# Patient Record
Sex: Female | Born: 1950 | Race: White | Hispanic: No | Marital: Married | Smoking: Never smoker
Health system: Southern US, Community
[De-identification: ages and names within clinical notes are randomized; demographics above are authoritative.]

## PROBLEM LIST (undated history)

## (undated) DIAGNOSIS — I10 Essential (primary) hypertension: Secondary | ICD-10-CM

## (undated) HISTORY — PX: ABDOMINAL HYSTERECTOMY: SHX81

---

## 2019-05-06 ENCOUNTER — Other Ambulatory Visit: Payer: Self-pay | Admitting: Family Medicine

## 2019-05-06 DIAGNOSIS — Z1231 Encounter for screening mammogram for malignant neoplasm of breast: Secondary | ICD-10-CM

## 2019-05-20 ENCOUNTER — Other Ambulatory Visit: Payer: Self-pay

## 2019-05-20 ENCOUNTER — Ambulatory Visit
Admission: RE | Admit: 2019-05-20 | Discharge: 2019-05-20 | Disposition: A | Discharge status: Home / Self Care | Payer: Medicare Other | Source: Ambulatory Visit | Attending: Family Medicine | Admitting: Family Medicine

## 2019-05-20 DIAGNOSIS — Z1231 Encounter for screening mammogram for malignant neoplasm of breast: Secondary | ICD-10-CM | POA: Diagnosis not present

## 2019-05-23 ENCOUNTER — Other Ambulatory Visit: Payer: Self-pay | Admitting: Family Medicine

## 2019-05-23 DIAGNOSIS — N6310 Unspecified lump in the right breast, unspecified quadrant: Secondary | ICD-10-CM

## 2019-05-23 DIAGNOSIS — R928 Other abnormal and inconclusive findings on diagnostic imaging of breast: Secondary | ICD-10-CM

## 2019-05-30 ENCOUNTER — Other Ambulatory Visit: Payer: Self-pay

## 2019-05-30 ENCOUNTER — Ambulatory Visit
Admission: RE | Admit: 2019-05-30 | Discharge: 2019-05-30 | Disposition: A | Discharge status: Home / Self Care | Payer: Medicare Other | Source: Ambulatory Visit | Attending: Family Medicine | Admitting: Family Medicine

## 2019-05-30 DIAGNOSIS — N6310 Unspecified lump in the right breast, unspecified quadrant: Secondary | ICD-10-CM

## 2019-05-30 DIAGNOSIS — R928 Other abnormal and inconclusive findings on diagnostic imaging of breast: Secondary | ICD-10-CM | POA: Diagnosis not present

## 2019-08-28 ENCOUNTER — Emergency Department (HOSPITAL_COMMUNITY): Payer: Medicare Other

## 2019-08-28 ENCOUNTER — Emergency Department (HOSPITAL_COMMUNITY)
Admission: EM | Admit: 2019-08-28 | Discharge: 2019-08-28 | Disposition: A | Discharge status: Home / Self Care | Payer: Medicare Other | Attending: Emergency Medicine | Admitting: Emergency Medicine

## 2019-08-28 ENCOUNTER — Other Ambulatory Visit: Payer: Self-pay

## 2019-08-28 ENCOUNTER — Encounter (HOSPITAL_COMMUNITY): Payer: Self-pay | Admitting: Emergency Medicine

## 2019-08-28 DIAGNOSIS — Z23 Encounter for immunization: Secondary | ICD-10-CM | POA: Diagnosis not present

## 2019-08-28 DIAGNOSIS — W07XXXA Fall from chair, initial encounter: Secondary | ICD-10-CM | POA: Diagnosis not present

## 2019-08-28 DIAGNOSIS — S81812A Laceration without foreign body, left lower leg, initial encounter: Secondary | ICD-10-CM | POA: Insufficient documentation

## 2019-08-28 DIAGNOSIS — Y9389 Activity, other specified: Secondary | ICD-10-CM | POA: Insufficient documentation

## 2019-08-28 DIAGNOSIS — S52572A Other intraarticular fracture of lower end of left radius, initial encounter for closed fracture: Secondary | ICD-10-CM | POA: Diagnosis not present

## 2019-08-28 DIAGNOSIS — Y929 Unspecified place or not applicable: Secondary | ICD-10-CM | POA: Insufficient documentation

## 2019-08-28 DIAGNOSIS — Y999 Unspecified external cause status: Secondary | ICD-10-CM | POA: Diagnosis not present

## 2019-08-28 DIAGNOSIS — S52615A Nondisplaced fracture of left ulna styloid process, initial encounter for closed fracture: Secondary | ICD-10-CM | POA: Diagnosis not present

## 2019-08-28 DIAGNOSIS — W19XXXA Unspecified fall, initial encounter: Secondary | ICD-10-CM

## 2019-08-28 DIAGNOSIS — I10 Essential (primary) hypertension: Secondary | ICD-10-CM | POA: Diagnosis not present

## 2019-08-28 DIAGNOSIS — S6992XA Unspecified injury of left wrist, hand and finger(s), initial encounter: Secondary | ICD-10-CM | POA: Diagnosis present

## 2019-08-28 HISTORY — DX: Essential (primary) hypertension: I10

## 2019-08-28 MED ORDER — TETANUS-DIPHTH-ACELL PERTUSSIS 5-2.5-18.5 LF-MCG/0.5 IM SUSP
0.5000 mL | Freq: Once | INTRAMUSCULAR | Status: AC
  Administered 2019-08-28: 16:00:00 0.5 mL via INTRAMUSCULAR
  Filled 2019-08-28: qty 0.5

## 2019-08-28 NOTE — ED Triage Notes (Signed)
LT wrist pain and swelling since she fell out of a chair today. Also laceration to LT shin no active bleeding.

## 2019-08-28 NOTE — ED Provider Notes (Signed)
[facility] EMERGENCY DEPARTMENT Provider Note   CSN: [id number] Arrival date & time: 08/28/19  1212     History   Chief Complaint Chief Complaint  Patient presents with  . Wrist Pain    HPI Victoria Dominguez is a 68 y.o. female with a past medical history of hypertension who presents today for evaluation after a fall.  She reports that she was standing on a chair when the chair collapsed causing her to fall.  She denies striking her head or passing out.  She reports landing on her left wrist and left shin.  She reports that she has a abrasion on her left shin and that her left wrist hurts.  She denies any pain in her left elbow or shoulder.  She denies any headache or neck pain.  She denies any other injuries.  She does not take any blood thinning medications.  No nausea or vomiting.  She denies any weakness, numbness, or tingling.  She has not had any medications or tried any interventions prior to arrival.  Last p.o. intake was at approximately 11 AM when she had a sandwich.     HPI  Past Medical History:  Diagnosis Date  . Hypertension     There are no active problems to display for this patient.   Past Surgical History:  Procedure Laterality Date  . ABDOMINAL HYSTERECTOMY       OB History   No obstetric history on file.      Home Medications    Prior to Admission medications   Not on File    Family History Family History  Problem Relation Age of Onset  . Breast cancer Sister 62    Social History Social History   Tobacco Use  . Smoking status: Never Smoker  . Smokeless tobacco: Never Used  Substance Use Topics  . Alcohol use: Never    Frequency: Never  . Drug use: Never     Allergies   Patient has no known allergies.   Review of Systems Review of Systems  Constitutional: Negative for chills and fever.  HENT: Negative for congestion.   Musculoskeletal: Negative for back pain and neck pain.       Deformity and pain of the left wrist.  She is  right-hand dominant.  Neurological: Negative for weakness and headaches.  All other systems reviewed and are negative.    Physical Exam Updated Vital Signs BP (!) 160/68 (BP Location: Right Arm)   Pulse (!) 114   Temp 99.1 F (37.3 C) (Oral)   Resp 18   Ht 5\' 6"  (1.676 m)   Wt 61.2 kg   SpO2 100%   BMI 21.79 kg/m   Physical Exam Vitals signs and nursing note reviewed.  Constitutional:      General: She is not in acute distress.    Appearance: She is well-developed. She is not diaphoretic.  HENT:     Head: Normocephalic and atraumatic.  Eyes:     General: No scleral icterus.       Right eye: No discharge.        Left eye: No discharge.     Conjunctiva/sclera: Conjunctivae normal.  Neck:     Musculoskeletal: Normal range of motion.  Cardiovascular:     Rate and Rhythm: Normal rate and regular rhythm.     Comments: 2+ left radial pulse.  Brisk capillary refill to fingers on left hand. Pulmonary:     Effort: Pulmonary effort is normal. No respiratory distress.  Breath sounds: No stridor.  Abdominal:     General: There is no distension.  Musculoskeletal:        General: No deformity.     Comments: There is obvious deformity of the left wrist with edema and tenderness to palpation.  There is no tenderness palpation over the left elbow, humerus, or shoulder.  Skin:    General: Skin is warm and dry.     Comments: There is a 3 cm skin tear on the left anterior shin.  No active bleeding at this time. Skin is intact over the left wrist/hand.  Neurological:     Mental Status: She is alert.     Motor: No abnormal muscle tone.     Comments: Sensation over left hand fingers is intact to light touch.  She is able to move the fingers on her left hand without pain or difficulty.  Psychiatric:        Mood and Affect: Mood normal.        Behavior: Behavior normal.      ED Treatments / Results  Labs (all labs ordered are listed, but only abnormal results are displayed)  Labs Reviewed - No data to display  EKG None  Radiology Dg Wrist Complete Left  Result Date: 08/28/2019 CLINICAL DATA:  Fall.  Pain and swelling. EXAM: LEFT WRIST - COMPLETE 3+ VIEW COMPARISON:  Insert [place] in FINDINGS: Comminuted fracture of the distal radial metaphysis identified with extension to the articular surface. There is apex anterior angulation. Associated ulnar styloid fracture noted on oblique film. Bones are diffusely demineralized IMPRESSION: Comminuted fracture of the distal radius with intra-articular extension and apex anterior angulation. Associated ulnar styloid fracture. Electronically Signed   By: Misty Stanley M.D.   On: 08/28/2019 13:44    Procedures Procedures (including critical care time)  Medications Ordered in ED Medications  Tdap (BOOSTRIX) injection 0.5 mL (0.5 mLs Intramuscular Incomplete 08/28/19 1602)     Initial Impression / Assessment and Plan / ED Course  I have reviewed the triage vital signs and the nursing notes.  Pertinent labs & imaging results that were available during my care of the patient were reviewed by me and considered in my medical decision making (see chart for details).       Patient presents today for evaluation after a non-syncopal mechanical fall.  She was standing on a chair when it collapsed causing her to fall on her left arm and shin.  She denies any pain in her left shin other than on the skin.  X-rays were obtained of her left wrist showing a comminuted fracture of the distal radial metaphysis with extension to the articular surface and apex anterior angulation.  She also has a ulnar styloid fracture.  Hand surgery was consulted. They recommended outpatient follow-up and for her to call the office today or tomorrow to set up an appointment.  Sugar tong splint and sling were ordered. She was offered Vicodin for pain control, however declined stating that she wishes to take Aleve at home.  For her skin tear on her shin we  discussed wound care options.  Based on how frail the skin is I suspect any sutures would tear.  It is not significantly gaping and is very shallow.  We discussed Steri-Strips versus antibiotic ointment and keeping it covered and she elected for antibiotic ointment and keeping it covered, declined Steri-Strips.  Tdap updated.  Return precautions were discussed with patient who states their understanding.  At the time of  discharge patient denied any unaddressed complaints or concerns.  Patient is agreeable for discharge home.   Final Clinical Impressions(s) / ED Diagnoses   Final diagnoses:  Other closed intra-articular fracture of distal end of left radius, initial encounter  Closed nondisplaced fracture of styloid process of left ulna, initial encounter  Fall, initial encounter    ED Discharge Orders    None       Lorin Glass, PA-C 08/28/19 1638    Sherwood Gambler, MD 08/30/19 1546

## 2019-08-28 NOTE — Discharge Instructions (Addendum)

## 2019-09-02 ENCOUNTER — Encounter: Payer: Self-pay | Admitting: Orthopedic Surgery

## 2019-09-02 ENCOUNTER — Encounter (HOSPITAL_COMMUNITY): Payer: Self-pay

## 2019-09-02 ENCOUNTER — Other Ambulatory Visit (HOSPITAL_COMMUNITY)
Admission: RE | Admit: 2019-09-02 | Discharge: 2019-09-02 | Disposition: A | Discharge status: Home / Self Care | Payer: Medicare Other | Source: Ambulatory Visit | Attending: Orthopedic Surgery | Admitting: Orthopedic Surgery

## 2019-09-02 ENCOUNTER — Ambulatory Visit (INDEPENDENT_AMBULATORY_CARE_PROVIDER_SITE_OTHER): Payer: Medicare Other | Admitting: Orthopedic Surgery

## 2019-09-02 ENCOUNTER — Other Ambulatory Visit: Payer: Self-pay

## 2019-09-02 ENCOUNTER — Encounter (HOSPITAL_COMMUNITY)
Admission: RE | Admit: 2019-09-02 | Discharge: 2019-09-02 | Disposition: A | Discharge status: Home / Self Care | Payer: Medicare Other | Source: Ambulatory Visit | Attending: Orthopedic Surgery | Admitting: Orthopedic Surgery

## 2019-09-02 VITALS — BP 132/68 | HR 96 | Ht 65.5 in | Wt 135.0 lb

## 2019-09-02 DIAGNOSIS — Z20828 Contact with and (suspected) exposure to other viral communicable diseases: Secondary | ICD-10-CM | POA: Insufficient documentation

## 2019-09-02 DIAGNOSIS — S52532A Colles' fracture of left radius, initial encounter for closed fracture: Secondary | ICD-10-CM | POA: Diagnosis not present

## 2019-09-02 DIAGNOSIS — I10 Essential (primary) hypertension: Secondary | ICD-10-CM | POA: Diagnosis not present

## 2019-09-02 DIAGNOSIS — S62102A Fracture of unspecified carpal bone, left wrist, initial encounter for closed fracture: Secondary | ICD-10-CM | POA: Diagnosis not present

## 2019-09-02 DIAGNOSIS — R000 Tachycardia, unspecified: Secondary | ICD-10-CM | POA: Insufficient documentation

## 2019-09-02 DIAGNOSIS — Z01812 Encounter for preprocedural laboratory examination: Secondary | ICD-10-CM | POA: Insufficient documentation

## 2019-09-02 LAB — CBC WITH DIFFERENTIAL/PLATELET
Abs Immature Granulocytes: 0.01 10*3/uL (ref 0.00–0.07)
Basophils Absolute: 0 10*3/uL (ref 0.0–0.1)
Basophils Relative: 1 %
Eosinophils Absolute: 0.1 10*3/uL (ref 0.0–0.5)
Eosinophils Relative: 1 %
HCT: 38.2 % (ref 36.0–46.0)
Hemoglobin: 12 g/dL (ref 12.0–15.0)
Immature Granulocytes: 0 %
Lymphocytes Relative: 17 %
Lymphs Abs: 0.9 10*3/uL (ref 0.7–4.0)
MCH: 28.7 pg (ref 26.0–34.0)
MCHC: 31.4 g/dL (ref 30.0–36.0)
MCV: 91.4 fL (ref 80.0–100.0)
Monocytes Absolute: 0.3 10*3/uL (ref 0.1–1.0)
Monocytes Relative: 6 %
Neutro Abs: 3.9 10*3/uL (ref 1.7–7.7)
Neutrophils Relative %: 75 %
Platelets: 257 10*3/uL (ref 150–400)
RBC: 4.18 MIL/uL (ref 3.87–5.11)
RDW: 12.5 % (ref 11.5–15.5)
WBC: 5.2 10*3/uL (ref 4.0–10.5)
nRBC: 0 % (ref 0.0–0.2)

## 2019-09-02 LAB — BASIC METABOLIC PANEL
Anion gap: 10 (ref 5–15)
BUN: 15 mg/dL (ref 8–23)
CO2: 24 mmol/L (ref 22–32)
Calcium: 9.2 mg/dL (ref 8.9–10.3)
Chloride: 107 mmol/L (ref 98–111)
Creatinine, Ser: 0.76 mg/dL (ref 0.44–1.00)
GFR calc Af Amer: >60 mL/min (ref >60)
GFR calc non Af Amer: >60 mL/min (ref >60)
Glucose, Bld: 111 mg/dL — ABNORMAL HIGH (ref 70–99)
Potassium: 3.9 mmol/L (ref 3.5–5.1)
Sodium: 141 mmol/L (ref 135–145)

## 2019-09-02 LAB — SARS CORONAVIRUS 2 (TAT 6-24 HRS): SARS Coronavirus 2: NEGATIVE

## 2019-09-02 NOTE — Patient Instructions (Signed)
Colles Fracture  Colles fracture is a type of broken wrist. It means that the radius bone is broken or cracked near the wrist joint. The radius is one of two bones in the forearm. It is on the same side as the thumb. The other forearm bone is called the ulna. Often, when someone has a Colles fracture, the ulna is also broken. As this injury heals, a splint or a cast is used to prevent the injured bone from moving (keep it immobilized). What are the causes? Common causes of this type of fracture include:  A hard, direct hit to the wrist.  Accidents, such as a car accident or falling on an outstretched hand. What increases the risk? You may be at higher risk for this type of fracture if you:  Play contact sports or high-risk sports, such as skiing, biking, or ice-skating.  Smoke.  Drink more than three alcoholic beverages a day.  Have low or lowered bone density (osteoporosis or osteopenia).  Are a young child or an older adult.  Are a woman who has gone through menopause.  Have a history of bone fractures.  Are not getting enough (have a deficiency in) calcium or vitamin D. What are the signs or symptoms? Symptoms of a Colles fracture may include:  Tenderness, bruising, and swelling over the fracture, which is usually near the wrist.  The wrist hanging in an odd position or looking misshapen (deformed).  Difficulty moving the wrist. How is this diagnosed? This condition may be diagnosed based on:  A physical exam.  A forearm X-ray.  Your symptoms and medical history. How is this treated? Treatment depends on many factors, including your age, your activity level, and how severe your fracture is. Treatment may include:  Immobilizing the wrist with a splint or a cast for several weeks. Before a splint or cast is placed on the arm, the health care provider may move the fractured bone or bones back into place (realignment).  Surgery, if the bone is completely out of place  (displaced). Metal pins or other devices may be used to help hold the bone in place while it heals. After surgery, the arm is put in a splint or cast.  Physical therapy. Follow these instructions at home: If you have a splint:  Wear the splint as told by your health care provider. Remove it only as told by your health care provider.  Loosen the splint if your fingers tingle, become numb, or turn cold and blue.  Keep the splint clean.  If your splint is not waterproof: ? Do not let it get wet. ? Cover it with a watertight covering when you take a bath or a shower. If you have a cast:  Do not stick anything inside the cast to scratch your skin. Doing that increases your risk for infection.  Check the skin around the cast every day. Tell your health care provider about any concerns.  You may put lotion on dry skin around the edges of the cast. Do not put lotion on the skin underneath the cast.  Keep the cast clean.  If the cast is not waterproof: ? Do not let it get wet. ? Cover it with a watertight covering when you take a bath or a shower. Managing pain, stiffness, and swelling   If directed, put ice on the injured area: ? If you have a removable splint, remove it as told by your health care provider. ? Put ice in a plastic bag. ?  Place a towel between your skin and the bag, or between your cast and the bag. ? Leave the ice on for 20 minutes, 2-3 times a day.  Move your fingers often to avoid stiffness and to lessen swelling.  Raise (elevate) your wrist above the level of your heart while you are sitting or lying down. Driving  Do not drive or use heavy machinery while taking prescription pain medicine.  Ask your health care provider if it is safe for you to drive if you have a splint or cast on your arm. Activity  Do not lift anything that is heavier than 10 lb (4.5 kg), or the limit that you are told, until your health care provider says that it is safe.  Do not use  your arm to support your body weight until your health care provider says that you can.  Return to your normal activities as told by your health care provider. Ask your health care provider what activities are safe for you.  If physical therapy was prescribed, do exercises as told by your health care provider. General instructions  Do not put pressure on any part of the splint or cast until it is fully hardened. This may take several hours.  Do not use any products that contain nicotine or tobacco, such as cigarettes and e-cigarettes. These can delay bone healing. If you need help quitting, ask your health care provider.  Take over-the-counter and prescription medicines only as told by your health care provider.  Keep all follow-up visits as told by your health care provider. This is important. Contact a health care provider if:  Your splint or cast: ? Gets wet. ? Gets damaged. ? Suddenly feels too tight.  You have: ? A fever or chills. ? Pain that does not get better with medicine. ? Swelling that gets worse. Get help right away if:  Your hand or fingernails: ? Turn blue or gray. ? Feel cold or numb.  You have tingling or numbness in your fingers, even after you loosen your splint (if this applies). Summary  Colles fracture is a type of broken wrist. It often involves both of the bones in the forearm (radius and ulna).  Fractures are common in young, active people who are involved in high-energy activities. They are also common in older people who are at risk for osteoporosis.  This injury is diagnosed with a physical exam and X-rays.  Your wrist will need to be held in place (immobilized) with a splint or cast for several weeks. You may need surgery for a more severe fracture. This information is not intended to replace advice given to you by your health care provider. Make sure you discuss any questions you have with your health care provider. Document Released:  11/23/2006 Document Revised: 10/20/2017 Document Reviewed: 10/06/2017 Elsevier Patient Education  2020 [employer].

## 2019-09-02 NOTE — Patient Instructions (Signed)
Your procedure is scheduled on: 09/05/2019  Report to [facility] at  9:10   AM.  Call this number if you have problems the morning of surgery: [phone]   Remember:   Do not Eat or Drink after midnight   :  Take these medicines the morning of surgery with A SIP OF WATER: omeprazole and flonase   Do not wear jewelry, make-up or nail polish.  Do not wear lotions, powders, or perfumes. You may wear deodorant.  Do not shave 48 hours prior to surgery. Men may shave face and neck.  Do not bring valuables to the hospital.  Contacts, dentures or bridgework may not be worn into surgery.  Leave suitcase in the car. After surgery it may be brought to your room.  For patients admitted to the hospital, checkout time is 11:00 AM the day of discharge.   Patients discharged the day of surgery will not be allowed to drive home.    Special Instructions: Shower using CHG night before surgery and shower the day of surgery use CHG.  Use special wash - you have one bottle of CHG for all showers.  You should use approximately 1/2 of the bottle for each shower.  Wrist Fracture Treated With ORIF A wrist fracture is a break or crack in one of the bones of the wrist. The wrist is made up of eight small bones at the palm of the hand (carpal bones) and two long bones that make up the forearm (radius and ulna). If the fracture is displaced, it means that one or more parts of a bone have been moved out of their normal position and the bones are not lined up correctly. Open reduction with internal fixation (ORIF) is a surgical procedure that is used to treat a displaced wrist fracture. In this procedure, a surgeon will put the bone pieces back together. The surgeon will put in plates, screws, or other types of hardware to hold the bones in place. The procedure helps the bones heal properly. Tell a health care provider about:  Any allergies you have.  All medicines you are taking, including vitamins, herbs, eye  drops, creams, and over-the-counter medicines.  Any problems you or family members have had with anesthetic medicines.  Any blood disorders you have.  Any surgeries you have had.  Any medical conditions you have.  Whether you are pregnant or may be pregnant. What are the risks? Generally, this is a safe procedure. However, problems may occur, including:  Infection.  Bleeding.  Failure of the bone to heal.  Wrist stiffness.  Damage to other structures.  Allergic reactions to medicines. What happens before the procedure? Staying hydrated Follow instructions from your health care provider about hydration, which may include:  Up to 2 hours before the procedure - you may continue to drink clear liquids, such as water, clear fruit juice, black coffee, and plain tea.  Eating and drinking restrictions Follow instructions from your health care provider about eating and drinking, which may include:  8 hours before the procedure - stop eating heavy meals or foods, such as meat, fried foods, or fatty foods.  6 hours before the procedure - stop eating light meals or foods, such as toast or cereal.  6 hours before the procedure - stop drinking milk or drinks that contain milk.  2 hours before the procedure - stop drinking clear liquids. Medicines Ask your health care provider about:  Changing or stopping your regular medicines. This is especially important  if you are taking diabetes medicines or blood thinners.  Taking medicines such as aspirin and ibuprofen. These medicines can thin your blood. Do not take these medicines unless your health care provider tells you to take them.  Taking over-the-counter medicines, vitamins, herbs, and supplements. General instructions  Plan to have someone take you home from the hospital or clinic.  Plan to have a responsible adult care for you for at least 24 hours after you leave the hospital or clinic. This is important.  Ask your health  care provider what steps will be taken to help prevent infection. These may include: ? Removing hair at the surgery site. ? Washing skin with a germ-killing soap. ? Taking antibiotic medicine. What happens during the procedure?   An IV will be inserted into one of your veins.  You will be given one or more of the following: ? A medicine to help you relax (sedative). ? A medicine to numb the area (local anesthetic). ? A medicine to make you fall asleep (general anesthetic). ? A medicine that is injected into an area of your body to numb everything below the injection site (regional anesthetic).  The surgeon will make an incision through your skin over the area of the fracture.  The broken bones will be returned to their normal positions. To hold the bones in place, the surgeon will use screws and a metal plate or different types of wiring.  The surgeon will close the incision with stitches (sutures) or staples.  A bandage (dressing) will be placed over your incision.  A splint or cast may be placed over your dressing. The procedure may vary among health care providers and hospitals. What happens after the procedure?  Your blood pressure, heart rate, breathing rate, and blood oxygen level will be monitored until you leave the hospital or clinic.  You will be given medicine as needed for pain.  Your wrist will be placed on pillows to keep it raised (elevated). This will help prevent swelling.  You may have physical therapy while you are in the hospital.  You may be sent home with a sling to support your arm.  Do not drive until your health care provider approves. Summary  If a wrist fracture is displaced, it means that one or more parts of a bone have been moved out of their normal position and the bones are not lined up correctly.  A displaced wrist fracture will be treated with a surgical procedure that is called open reduction with internal fixation (ORIF).  The bone  pieces are put back together and held in place by plates, screws, or other types of hardware.  Follow instructions from your health care provider about eating and drinking before the procedure. This information is not intended to replace advice given to you by your health care provider. Make sure you discuss any questions you have with your health care provider. Document Released: 10/19/2015 Document Revised: 03/27/2018 Document Reviewed: 03/27/2018 Elsevier Patient Education  [place].  Wrist Fracture Treated With ORIF, Care After This sheet gives you information about how to care for yourself after your procedure. Your health care provider may also give you more specific instructions. If you have problems or questions, contact your health care provider. What can I expect after the procedure? After the procedure, it is common to have:  Pain.  Swelling.  Stiffness.  A small amount of drainage from the incision. Follow these instructions at home: If you have a cast:  Do not stick anything inside the cast to scratch your skin. Doing that increases your risk of infection.  Check the skin around the cast every day. Tell your health care provider about any concerns.  You may put lotion on dry skin around the edges of the cast. Do not put lotion on the skin underneath the cast.  Keep the cast clean and dry. If you have a splint or sling:  Wear the splint or sling as told by your health care provider. Remove it only as told by your health care provider.  Loosen the splint or sling if your fingers tingle, become numb, or turn cold and blue.  Keep the splint or sling clean and dry. Bathing  Do not take baths, swim, or use a hot tub until your health care provider approves. Ask your health care provider if you may take showers. You may only be allowed to take sponge baths.  If your cast, splint, or sling is not waterproof: ? Do not let it get wet. ? Cover it with a  watertight covering when you take a bath or shower.  If you have a sling, remove it for bathing only if your health care provider tells you that it is safe to do so.  Keep the bandage (dressing) dry until your health care provider says it can be removed. Incision care   Follow instructions from your health care provider about how to take care of your incision. Make sure you: ? Wash your hands with soap and water before you change your bandage (dressing). If soap and water are not available, use hand sanitizer. ? Change your dressing as told by your health care provider. ? Leave stitches (sutures), skin glue, or adhesive strips in place. These skin closures may need to stay in place for 2 weeks or longer. If adhesive strip edges start to loosen and curl up, you may trim the loose edges. Do not remove adhesive strips completely unless your health care provider tells you to do that.  Check your incision area every day for signs of infection. Check for: ? Redness. ? More swelling or pain. ? Blood or more fluid. ? Warmth. ? Pus or a bad smell. Managing pain, stiffness, and swelling   If directed, put ice on the injured area. ? If you have a removable splint or sling, remove it as told by your health care provider. ? Put ice in a plastic bag. ? Place a towel between your skin and the bag or between your cast and the bag. ? Leave the ice on for 20 minutes, 2-3 times a day.  Move your fingers often to avoid stiffness and to lessen swelling.  Raise (elevate) the injured area above the level of your heart while you are sitting or lying down. Driving  Do not drive or use heavy machinery while taking prescription pain medicine.  Do not drive for 24 hours if you were given a sedative during your procedure.  Ask your health care provider when it is safe to drive if you have a cast, splint, or sling on your wrist. Activity  Return to your normal activities as told by your health care  provider. Ask your health care provider what activities are safe for you.  Do exercises as told by your health care provider.  Do not lift with your injured wrist until your health care provider approves.  Avoid pulling and pushing. General instructions  Do not put pressure on any part of the cast  or splint until it is fully hardened. This may take several hours.  Take over-the-counter and prescription medicines only as told by your health care provider.  Do not use any products that contain nicotine or tobacco, such as cigarettes and e-cigarettes. These can delay bone healing after surgery. If you need help quitting, ask your health care provider.  If you were prescribed pain medicine, take steps to prevent or treat constipation. Your health care provider may recommend that you: ? Drink enough fluid to keep your urine pale yellow. ? Take over-the-counter or prescription medicines. ? Eat foods that are high in fiber, such as beans, whole grains, and fresh fruits and vegetables. ? Limit foods that are high in fat and processed sugars, such as fried or sweet foods.  Keep all follow-up visits as told by your health care provider. This is important. Contact a health care provider if:  Your cast, splint, or sling is damaged or loose.  Your pain is not controlled with medicine.  You have a fever.  You have redness around your incision.  You have more swelling or pain around your incision.  You have blood or more fluid coming from your incision.  Your incision feels warm to the touch.  You have pus or a bad smell coming from your incision or your dressing.  You develop a rash. Get help right away if:  Your skin or fingers on your injured arm turn blue or gray.  Your arm feels cold or numb.  You have severe pain in your injured wrist.  You have trouble breathing.  You feel faint or light-headed. Summary  After the procedure, it is common to have pain, swelling,  stiffness, and a small amount of drainage from the incision.  You may use ice, elevation, and pain medicine as told by your health care provider to lessen pain and swelling.  Wear your splint or sling as told by your health care provider.  Do not lift with your injured wrist until your health care provider approves. This information is not intended to replace advice given to you by your health care provider. Make sure you discuss any questions you have with your health care provider. Document Released: 10/19/2015 Document Revised: 03/27/2018 Document Reviewed: 03/27/2018 Elsevier Patient Education  2020 [place] Anesthesia, Adult, Care After This sheet gives you information about how to care for yourself after your procedure. Your health care provider may also give you more specific instructions. If you have problems or questions, contact your health care provider. What can I expect after the procedure? After the procedure, the following side effects are common:  Pain or discomfort at the IV site.  Nausea.  Vomiting.  Sore throat.  Trouble concentrating.  Feeling cold or chills.  Weak or tired.  Sleepiness and fatigue.  Soreness and body aches. These side effects can affect parts of the body that were not involved in surgery. Follow these instructions at home:  For at least 24 hours after the procedure:  Have a responsible adult stay with you. It is important to have someone help care for you until you are awake and alert.  Rest as needed.  Do not: ? Participate in activities in which you could fall or become injured. ? Drive. ? Use heavy machinery. ? Drink alcohol. ? Take sleeping pills or medicines that cause drowsiness. ? Make important decisions or sign legal documents. ? Take care of children on your own. Eating and drinking  Follow any instructions  from your health care provider about eating or drinking restrictions.  When you feel hungry,  start by eating small amounts of foods that are soft and easy to digest (bland), such as toast. Gradually return to your regular diet.  Drink enough fluid to keep your urine pale yellow.  If you vomit, rehydrate by drinking water, juice, or clear broth. General instructions  If you have sleep apnea, surgery and certain medicines can increase your risk for breathing problems. Follow instructions from your health care provider about wearing your sleep device: ? Anytime you are sleeping, including during daytime naps. ? While taking prescription pain medicines, sleeping medicines, or medicines that make you drowsy.  Return to your normal activities as told by your health care provider. Ask your health care provider what activities are safe for you.  Take over-the-counter and prescription medicines only as told by your health care provider.  If you smoke, do not smoke without supervision.  Keep all follow-up visits as told by your health care provider. This is important. Contact a health care provider if:  You have nausea or vomiting that does not get better with medicine.  You cannot eat or drink without vomiting.  You have pain that does not get better with medicine.  You are unable to pass urine.  You develop a skin rash.  You have a fever.  You have redness around your IV site that gets worse. Get help right away if:  You have difficulty breathing.  You have chest pain.  You have blood in your urine or stool, or you vomit blood. Summary  After the procedure, it is common to have a sore throat or nausea. It is also common to feel tired.  Have a responsible adult stay with you for the first 24 hours after general anesthesia. It is important to have someone help care for you until you are awake and alert.  When you feel hungry, start by eating small amounts of foods that are soft and easy to digest (bland), such as toast. Gradually return to your regular diet.  Drink  enough fluid to keep your urine pale yellow.  Return to your normal activities as told by your health care provider. Ask your health care provider what activities are safe for you. This information is not intended to replace advice given to you by your health care provider. Make sure you discuss any questions you have with your health care provider. Document Released: 02/13/2001 Document Revised: 11/10/2017 Document Reviewed: 06/23/2017 Elsevier Patient Education  2020 [employer].

## 2019-09-02 NOTE — Progress Notes (Signed)
Diksha Delsignore  09/02/2019  HISTORY SECTION :  Chief Complaint  Patient presents with  . Wrist Injury    08/28/2019 left wrist fracture    The patient presents for evaluation of left wrist fracture status post fall history of osteoporosis  Left wrist 5 days Mild Dull Swelling     Review of Systems  All other systems reviewed and are negative.    has a past medical history of Hypertension.   Past Surgical History:  Procedure Laterality Date  . ABDOMINAL HYSTERECTOMY      Body mass index is 22.12 kg/m.   No Known Allergies   Current Outpatient Medications:  .  ibandronate (BONIVA) 150 MG tablet, Take by mouth., Disp: , Rfl:  .  lisinopril (ZESTRIL) 10 MG tablet, , Disp: , Rfl:  .  cetirizine (ZYRTEC) 10 MG tablet, Take by mouth., Disp: , Rfl:  .  fluticasone (FLONASE) 50 MCG/ACT nasal spray, Place into the nose., Disp: , Rfl:  .  omeprazole (PRILOSEC) 20 MG capsule, Take by mouth., Disp: , Rfl:    PHYSICAL EXAM SECTION: 1) BP 132/68   Pulse 96   Ht 5' 5.5" (1.664 m)   Wt 135 lb (61.2 kg)   BMI 22.12 kg/m   Body mass index is 22.12 kg/m. General appearance: Well-developed well-nourished no gross deformities  2) Cardiovascular normal pulse and perfusion in the upper extremities normal color without edema  3) Neurologically deep tendon reflexes are equal and normal, no sensation loss or deficits no pathologic reflexes  4) Psychological: Awake alert and oriented x3 mood and affect normal  5) Skin no lacerations or ulcerations no nodularity no palpable masses, no erythema or nodularity  6) Musculoskeletal:  Right wrist normal alignment range of motion  Left wrist skin normal no tenderness in the elbow or shoulder, tenderness over the left distal radius with swelling decreased range of motion no sensory abnormalities color normal capillary refill excellent  MEDICAL DECISION SECTION:  Encounter Diagnosis  Name Primary?  . Closed Colles' fracture of left  radius, initial encounter Yes    Imaging Hospital x-ray shows a dorsally angulated fracture of the distal radius left wrist   Plan:  (Rx., Inj., surg., Frx, MRI/CT, XR:2)  The procedure has been fully reviewed with the patient; The risks and benefits of surgery have been discussed and explained and understood. Alternative treatment has also been reviewed, questions were encouraged and answered. The postoperative plan is also been reviewed.   ORIF left wrist  9:54 AM Arther Abbott, MD  09/02/2019

## 2019-09-04 NOTE — H&P (Signed)
  Manvi Phimmasone   09/02/2019   HISTORY SECTION :       Chief Complaint  Patient presents with  . Wrist Injury      08/28/2019 left wrist fracture    The patient presents for evaluation of left wrist fracture status post fall history of osteoporosis   Left wrist 5 days Mild Dull Swelling         Review of Systems  All other systems reviewed and are negative.      has a past medical history of Hypertension.         Past Surgical History:  Procedure Laterality Date  . ABDOMINAL HYSTERECTOMY         Body mass index is 22.12 kg/m.     No Known Allergies    Current Outpatient Medications:  .  ibandronate (BONIVA) 150 MG tablet, Take by mouth., Disp: , Rfl:  .  lisinopril (ZESTRIL) 10 MG tablet, , Disp: , Rfl:  .  cetirizine (ZYRTEC) 10 MG tablet, Take by mouth., Disp: , Rfl:  .  fluticasone (FLONASE) 50 MCG/ACT nasal spray, Place into the nose., Disp: , Rfl:  .  omeprazole (PRILOSEC) 20 MG capsule, Take by mouth., Disp: , Rfl:      PHYSICAL EXAM SECTION: 1) BP 132/68   Pulse 96   Ht 5' 5.5" (1.664 m)   Wt 135 lb (61.2 kg)   BMI 22.12 kg/m   Body mass index is 22.12 kg/m. General appearance: Well-developed well-nourished no gross deformities  2) Cardiovascular normal pulse and perfusion in the upper extremities normal color without edema  3) Neurologically deep tendon reflexes are equal and normal, no sensation loss or deficits no pathologic reflexes   4) Psychological: Awake alert and oriented x3 mood and affect normal   5) Skin no lacerations or ulcerations no nodularity no palpable masses, no erythema or nodularity   6) Musculoskeletal:   Right wrist normal alignment range of motion   Left wrist skin normal no tenderness in the elbow or shoulder, tenderness over the left distal radius with swelling decreased range of motion no sensory abnormalities color normal capillary refill excellent   MEDICAL DECISION SECTION:      Encounter Diagnosis  Name  Primary?  . Closed Colles' fracture of left radius, initial encounter Yes     Imaging Hospital x-ray shows a dorsally angulated fracture of the distal radius left wrist     Plan:  (Rx., Inj., surg., Frx, MRI/CT, XR:2)   The procedure has been fully reviewed with the patient; The risks and benefits of surgery have been discussed and explained and understood. Alternative treatment has also been reviewed, questions were encouraged and answered. The postoperative plan is also been reviewed.     ORIF left wrist   9:54 AM Arther Abbott, MD   09/02/2019

## 2019-09-05 ENCOUNTER — Ambulatory Visit (HOSPITAL_COMMUNITY): Payer: Medicare Other

## 2019-09-05 ENCOUNTER — Encounter (HOSPITAL_COMMUNITY): Payer: Self-pay | Admitting: *Deleted

## 2019-09-05 ENCOUNTER — Ambulatory Visit (HOSPITAL_COMMUNITY): Payer: Medicare Other | Admitting: Anesthesiology

## 2019-09-05 ENCOUNTER — Ambulatory Visit (HOSPITAL_COMMUNITY)
Admission: RE | Admit: 2019-09-05 | Discharge: 2019-09-05 | Disposition: A | Discharge status: Home / Self Care | Payer: Medicare Other | Attending: Orthopedic Surgery | Admitting: Orthopedic Surgery

## 2019-09-05 ENCOUNTER — Encounter (HOSPITAL_COMMUNITY)
Admission: RE | Disposition: A | Discharge status: Home / Self Care | Payer: Self-pay | Source: Home / Self Care | Attending: Orthopedic Surgery

## 2019-09-05 DIAGNOSIS — I10 Essential (primary) hypertension: Secondary | ICD-10-CM | POA: Diagnosis not present

## 2019-09-05 DIAGNOSIS — K219 Gastro-esophageal reflux disease without esophagitis: Secondary | ICD-10-CM | POA: Diagnosis not present

## 2019-09-05 DIAGNOSIS — S62102A Fracture of unspecified carpal bone, left wrist, initial encounter for closed fracture: Secondary | ICD-10-CM

## 2019-09-05 DIAGNOSIS — X58XXXA Exposure to other specified factors, initial encounter: Secondary | ICD-10-CM | POA: Insufficient documentation

## 2019-09-05 DIAGNOSIS — S62102D Fracture of unspecified carpal bone, left wrist, subsequent encounter for fracture with routine healing: Secondary | ICD-10-CM | POA: Diagnosis not present

## 2019-09-05 DIAGNOSIS — S52532A Colles' fracture of left radius, initial encounter for closed fracture: Secondary | ICD-10-CM | POA: Diagnosis present

## 2019-09-05 DIAGNOSIS — Z79899 Other long term (current) drug therapy: Secondary | ICD-10-CM | POA: Insufficient documentation

## 2019-09-05 HISTORY — PX: ORIF WRIST FRACTURE: SHX2133

## 2019-09-05 SURGERY — OPEN REDUCTION INTERNAL FIXATION (ORIF) WRIST FRACTURE
Anesthesia: General | Site: Wrist | Laterality: Left

## 2019-09-05 MED ORDER — PHENYLEPHRINE 40 MCG/ML (10ML) SYRINGE FOR IV PUSH (FOR BLOOD PRESSURE SUPPORT)
PREFILLED_SYRINGE | INTRAVENOUS | Status: AC
  Filled 2019-09-05: qty 10

## 2019-09-05 MED ORDER — FENTANYL CITRATE (PF) 100 MCG/2ML IJ SOLN
INTRAMUSCULAR | Status: DC | PRN
  Administered 2019-09-05 (×2): 25 ug via INTRAVENOUS

## 2019-09-05 MED ORDER — ROCURONIUM BROMIDE 10 MG/ML (PF) SYRINGE
PREFILLED_SYRINGE | INTRAVENOUS | Status: AC
  Filled 2019-09-05: qty 10

## 2019-09-05 MED ORDER — ONDANSETRON HCL 4 MG/2ML IJ SOLN
4.0000 mg | Freq: Once | INTRAMUSCULAR | Status: AC
  Administered 2019-09-05: 12:00:00 4 mg via INTRAVENOUS
  Filled 2019-09-05: qty 2

## 2019-09-05 MED ORDER — IBUPROFEN 400 MG PO TABS
400.0000 mg | ORAL_TABLET | Freq: Once | ORAL | Status: AC
  Administered 2019-09-05: 400 mg via ORAL
  Filled 2019-09-05: qty 1

## 2019-09-05 MED ORDER — ONDANSETRON HCL 4 MG/2ML IJ SOLN
INTRAMUSCULAR | Status: DC | PRN
  Administered 2019-09-05: 4 mg via INTRAVENOUS

## 2019-09-05 MED ORDER — PROMETHAZINE HCL 25 MG/ML IJ SOLN: 6.2500 mg | INTRAMUSCULAR | Status: DC | PRN

## 2019-09-05 MED ORDER — HYDROCODONE-ACETAMINOPHEN 7.5-325 MG PO TABS
1.0000 | ORAL_TABLET | Freq: Once | ORAL | Status: AC | PRN
  Administered 2019-09-05: 12:00:00 1 via ORAL
  Filled 2019-09-05: qty 1

## 2019-09-05 MED ORDER — IBUPROFEN 800 MG PO TABS: 800.0000 mg | ORAL_TABLET | Freq: Three times a day (TID) | ORAL | 1 refills | Status: AC | PRN

## 2019-09-05 MED ORDER — CHLORHEXIDINE GLUCONATE 4 % EX LIQD: 60.0000 mL | Freq: Once | CUTANEOUS | Status: DC

## 2019-09-05 MED ORDER — CEFAZOLIN SODIUM-DEXTROSE 2-4 GM/100ML-% IV SOLN
2.0000 g | INTRAVENOUS | Status: AC
  Administered 2019-09-05: 2 g via INTRAVENOUS
  Filled 2019-09-05: qty 100

## 2019-09-05 MED ORDER — ROCURONIUM BROMIDE 100 MG/10ML IV SOLN
INTRAVENOUS | Status: DC | PRN
  Administered 2019-09-05: 50 mg via INTRAVENOUS

## 2019-09-05 MED ORDER — HYDROMORPHONE HCL 1 MG/ML IJ SOLN: 0.2500 mg | INTRAMUSCULAR | Status: DC | PRN

## 2019-09-05 MED ORDER — DEXAMETHASONE SODIUM PHOSPHATE 10 MG/ML IJ SOLN
INTRAMUSCULAR | Status: DC | PRN
  Administered 2019-09-05: 10 mg via INTRAVENOUS

## 2019-09-05 MED ORDER — ACETAMINOPHEN 325 MG PO TABS: 650.0000 mg | ORAL_TABLET | ORAL | 2 refills | Status: AC | PRN

## 2019-09-05 MED ORDER — LACTATED RINGERS IV SOLN
INTRAVENOUS | Status: DC
  Administered 2019-09-05: 10:00:00 via INTRAVENOUS

## 2019-09-05 MED ORDER — PHENYLEPHRINE HCL (PRESSORS) 10 MG/ML IV SOLN
INTRAVENOUS | Status: DC | PRN
  Administered 2019-09-05 (×2): 40 ug via INTRAVENOUS

## 2019-09-05 MED ORDER — SUGAMMADEX SODIUM 500 MG/5ML IV SOLN
INTRAVENOUS | Status: DC | PRN
  Administered 2019-09-05: 200 mg via INTRAVENOUS

## 2019-09-05 MED ORDER — SODIUM CHLORIDE 0.9 % IR SOLN
Status: DC | PRN
  Administered 2019-09-05: 1000 mL

## 2019-09-05 MED ORDER — ONDANSETRON HCL 4 MG/2ML IJ SOLN
INTRAMUSCULAR | Status: AC
  Filled 2019-09-05: qty 2

## 2019-09-05 MED ORDER — BUPIVACAINE HCL (PF) 0.5 % IJ SOLN
INTRAMUSCULAR | Status: DC | PRN
  Administered 2019-09-05: 20 mL

## 2019-09-05 MED ORDER — MIDAZOLAM HCL 2 MG/2ML IJ SOLN: 0.5000 mg | Freq: Once | INTRAMUSCULAR | Status: DC | PRN

## 2019-09-05 MED ORDER — PROPOFOL 10 MG/ML IV BOLUS
INTRAVENOUS | Status: DC | PRN
  Administered 2019-09-05: 170 mg via INTRAVENOUS

## 2019-09-05 MED ORDER — ACETAMINOPHEN 500 MG PO TABS
500.0000 mg | ORAL_TABLET | Freq: Once | ORAL | Status: AC
  Administered 2019-09-05: 500 mg via ORAL
  Filled 2019-09-05: qty 1

## 2019-09-05 MED ORDER — BUPIVACAINE HCL (PF) 0.5 % IJ SOLN
INTRAMUSCULAR | Status: AC
  Filled 2019-09-05: qty 60

## 2019-09-05 MED ORDER — FENTANYL CITRATE (PF) 100 MCG/2ML IJ SOLN
INTRAMUSCULAR | Status: AC
  Filled 2019-09-05: qty 2

## 2019-09-05 MED ORDER — DEXAMETHASONE SODIUM PHOSPHATE 10 MG/ML IJ SOLN
INTRAMUSCULAR | Status: AC
  Filled 2019-09-05: qty 1

## 2019-09-05 MED ORDER — PROPOFOL 10 MG/ML IV BOLUS
INTRAVENOUS | Status: AC
  Filled 2019-09-05: qty 20

## 2019-09-05 MED ORDER — ACETAMINOPHEN-CODEINE #3 300-30 MG PO TABS: 1.0000 | ORAL_TABLET | ORAL | 0 refills | Status: AC | PRN

## 2019-09-05 SURGICAL SUPPLY — 49 items
BANDAGE ESMARK 4X12 BL STRL LF (DISPOSABLE) ×1 IMPLANT
BENZOIN TINCTURE PRP APPL 2/3 (GAUZE/BANDAGES/DRESSINGS) ×3 IMPLANT
BIT DRILL 2 FAST STEP (BIT) ×3 IMPLANT
BIT DRILL 2.5X4 QC (BIT) ×3 IMPLANT
BNDG COHESIVE 4X5 TAN STRL (GAUZE/BANDAGES/DRESSINGS) ×3 IMPLANT
BNDG ELASTIC 3X5.8 VLCR NS LF (GAUZE/BANDAGES/DRESSINGS) ×3 IMPLANT
BNDG ESMARK 4X12 BLUE STRL LF (DISPOSABLE) ×3
CHLORAPREP W/TINT 26 (MISCELLANEOUS) ×3 IMPLANT
CLOSURE WOUND 1/2 X4 (GAUZE/BANDAGES/DRESSINGS) ×1
CLOTH BEACON ORANGE TIMEOUT ST (SAFETY) ×3 IMPLANT
COVER LIGHT HANDLE STERIS (MISCELLANEOUS) ×6 IMPLANT
COVER WAND RF STERILE (DRAPES) ×3 IMPLANT
CUFF TOURN SGL QUICK 18X4 (TOURNIQUET CUFF) ×3 IMPLANT
DRAPE C-ARM FOLDED MOBILE STRL (DRAPES) ×3 IMPLANT
ELECT REM PT RETURN 9FT ADLT (ELECTROSURGICAL) ×3
ELECTRODE REM PT RTRN 9FT ADLT (ELECTROSURGICAL) ×1 IMPLANT
GAUZE SPONGE 4X4 12PLY STRL (GAUZE/BANDAGES/DRESSINGS) ×3 IMPLANT
GAUZE XEROFORM 5X9 LF (GAUZE/BANDAGES/DRESSINGS) ×3 IMPLANT
GLOVE BIO SURGEON STRL SZ7 (GLOVE) ×3 IMPLANT
GLOVE BIOGEL PI IND STRL 7.0 (GLOVE) ×3 IMPLANT
GLOVE BIOGEL PI INDICATOR 7.0 (GLOVE) ×6
GLOVE ECLIPSE 6.5 STRL STRAW (GLOVE) ×3 IMPLANT
GLOVE SKINSENSE NS SZ8.0 LF (GLOVE) ×2
GLOVE SKINSENSE STRL SZ8.0 LF (GLOVE) ×1 IMPLANT
GLOVE SS N UNI LF 8.5 STRL (GLOVE) ×3 IMPLANT
GOWN STRL REUS W/TWL LRG LVL3 (GOWN DISPOSABLE) ×6 IMPLANT
GOWN STRL REUS W/TWL XL LVL3 (GOWN DISPOSABLE) ×3 IMPLANT
INST SET MINOR BONE (KITS) ×3 IMPLANT
KIT TURNOVER KIT A (KITS) ×3 IMPLANT
MANIFOLD NEPTUNE II (INSTRUMENTS) ×3 IMPLANT
NEEDLE HYPO 21X1.5 SAFETY (NEEDLE) ×3 IMPLANT
NS IRRIG 1000ML POUR BTL (IV SOLUTION) ×3 IMPLANT
PACK BASIC LIMB (CUSTOM PROCEDURE TRAY) ×3 IMPLANT
PAD ARMBOARD 7.5X6 YLW CONV (MISCELLANEOUS) ×3 IMPLANT
PEG SUBCHONDRAL SMOOTH 2.0X16 (Peg) ×6 IMPLANT
PEG SUBCHONDRAL SMOOTH 2.0X22 (Peg) ×6 IMPLANT
PEG THREADED 2.5MMX18MM LONG (Peg) ×3 IMPLANT
PEG THREADED 2.5MMX22MM LONG (Peg) ×3 IMPLANT
SCREW BN 12X3.5XNS CORT TI (Screw) ×1 IMPLANT
SCREW BN 13X3.5XNS CORT TI (Screw) ×1 IMPLANT
SCREW CORT 3.5X12 (Screw) ×2 IMPLANT
SCREW CORT 3.5X13 (Screw) ×2 IMPLANT
SCREW CORT 3.5X14 LNG (Screw) ×3 IMPLANT
SET BASIN LINEN APH (SET/KITS/TRAYS/PACK) ×3 IMPLANT
SPLINT IMMOBILIZER J 3INX20FT (CAST SUPPLIES) ×2
SPLINT J IMMOBILIZER 3X20FT (CAST SUPPLIES) ×1 IMPLANT
STRIP CLOSURE SKIN 1/2X4 (GAUZE/BANDAGES/DRESSINGS) ×2 IMPLANT
SYR CONTROL 10ML LL (SYRINGE) ×3 IMPLANT
WATER STERILE IRR 1000ML POUR (IV SOLUTION) ×3 IMPLANT

## 2019-09-05 NOTE — Brief Op Note (Signed)
09/05/2019  11:46 AM  PATIENT:  Victoria Dominguez  68 y.o. female  PRE-OPERATIVE DIAGNOSIS:  left wrist fracture  POST-OPERATIVE DIAGNOSIS:  left wrist fracture  PROCEDURE:  Procedure(s): OPEN REDUCTION INTERNAL FIXATION (ORIF) WRIST FRACTURE (Left) CAST APPLICATION (Left) - [id number]  Operative findings 2 part Colles' fracture dorsal angulation minimal displacement  Implants DVR plate with 6 pegs distally 2 of which were threaded and 3 cortical screws  SURGEON:  Surgeon(s) and Role:    Carole Civil, MD - Primary  Details of procedure The patient was evaluated preoperatively and found to be stable for surgery.  A thorough chart review image review was performed site confirmation left wrist performed with site marked  Patient taken to the operating room for general anesthesia and administered 2 g of Ancef  She was in the supine position  After sterile prep and drape and timeout the limb was exsanguinated with a 4 inch Esmarch the tourniquet was elevated to 250 mmHg  Close manipulation was performed and x-ray confirmed that the fracture was reducible without much manipulation  A volar approach to the wrist using the modified Mallie Mussel approach was performed.  After subcutaneous tissue was divided blunt dissection was carried out to the flexor carpi radialis tendon which was followed by opening the tendon sheath and retracting it radially  Blunt dissection was carried down to the pronator quadratus which was incised and retracted ulnarly  The plate was applied distally for starting with the proximal ulnar PEG a smooth PEG was placed followed by 2 threaded pegs.  We then placed 1 peg in the distal hole.  Radiographs for confirmed screw placement  We then brought the plate down to the cortex of the proximal radius and placed 1 screw using a cortical screw.  A second and third screw were placed.  Radiographs were taken again reduction was confirmed  The remaining pegs were filled except  for the last distal peg which was removed  Total of 6 pegs were placed distally and 3 cortical screws proximally  The wound was irrigated and closed with 2-0 Monocryl suture in a running fashion and applied Steri-Strips with benzoin and then volar splint  Tourniquet was released prior to applying the splint  Patient was extubated taken recovery room in stable condition   Assisted by Corrie Dandy  General anesthesia  Estimated blood loss 5 cc  No blood administered  No drains  Local medications Marcaine with epinephrine 20 cc  No specimens  Sponge and needle count correct   TOURNIQUET:   Total Tourniquet Time Documented: Upper Arm (Left) - 50 minutes Total: Upper Arm (Left) - 50 minutes   DICTATION: .[name] Dictation  PLAN OF CARE: Discharge to home after PACU  PATIENT DISPOSITION:  PACU - hemodynamically stable.   Delay start of Pharmacological VTE agent (>24hrs) due to surgical blood loss or risk of bleeding: not applicable

## 2019-09-05 NOTE — Anesthesia Postprocedure Evaluation (Signed)
Anesthesia Post Note  Patient: Victoria Dominguez  Procedure(s) Performed: OPEN REDUCTION INTERNAL FIXATION (ORIF) WRIST FRACTURE (Left Wrist) CAST APPLICATION (Left Wrist)  Patient location during evaluation: PACU Anesthesia Type: General Level of consciousness: awake and alert and patient cooperative Pain management: satisfactory to patient Vital Signs Assessment: post-procedure vital signs reviewed and stable Respiratory status: spontaneous breathing Cardiovascular status: stable Postop Assessment: no apparent nausea or vomiting Anesthetic complications: no     Last Vitals:  Vitals:   09/05/19 1158 09/05/19 1200  BP:  127/68  Pulse: 89 89  Resp: 16 16  Temp:    SpO2: 99% 100%    Last Pain:  Vitals:   09/05/19 1200  TempSrc:   PainSc: 0-No pain                 Lydia Meng

## 2019-09-05 NOTE — Transfer of Care (Signed)
Immediate Anesthesia Transfer of Care Note  Patient: Victoria Dominguez  Procedure(s) Performed: OPEN REDUCTION INTERNAL FIXATION (ORIF) WRIST FRACTURE (Left Wrist) CAST APPLICATION (Left Wrist)  Patient Location: PACU  Anesthesia Type:General  Level of Consciousness: awake, alert  and patient cooperative  Airway & Oxygen Therapy: Patient Spontanous Breathing  Post-op Assessment: Report given to RN and Post -op Vital signs reviewed and stable  Post vital signs: Reviewed and stable  Last Vitals:  Vitals Value Taken Time  BP 122/58 09/05/19 1141  Temp    Pulse 43 09/05/19 1141  Resp 23 09/05/19 1143  SpO2 81 % 09/05/19 1141  Vitals shown include unvalidated device data.  Last Pain:  Vitals:   09/05/19 0902  TempSrc: Oral  PainSc: 6     SEE PACU flowsheet for vital signs.  Patients Stated Pain Goal: 9 ([id number])  Complications: No apparent anesthesia complications

## 2019-09-05 NOTE — Op Note (Signed)
09/05/2019  11:46 AM  PATIENT:  Victoria Dominguez  68 y.o. female  PRE-OPERATIVE DIAGNOSIS:  left wrist fracture  POST-OPERATIVE DIAGNOSIS:  left wrist fracture  PROCEDURE:  Procedure(s): OPEN REDUCTION INTERNAL FIXATION (ORIF) WRIST FRACTURE (Left) CAST APPLICATION (Left) - 123456  Operative findings 2 part Colles' fracture dorsal angulation minimal displacement  Implants DVR plate with 6 pegs distally 2 of which were threaded and 3 cortical screws  SURGEON:  Surgeon(s) and Role:    Carole Civil, MD - Primary  Details of procedure The patient was evaluated preoperatively and found to be stable for surgery.  A thorough chart review image review was performed site confirmation left wrist performed with site marked  Patient taken to the operating room for general anesthesia and administered 2 g of Ancef  She was in the supine position  After sterile prep and drape and timeout the limb was exsanguinated with a 4 inch Esmarch the tourniquet was elevated to 250 mmHg  Close manipulation was performed and x-ray confirmed that the fracture was reducible without much manipulation  A volar approach to the wrist using the modified Mallie Mussel approach was performed.  After subcutaneous tissue was divided blunt dissection was carried out to the flexor carpi radialis tendon which was followed by opening the tendon sheath and retracting it radially  Blunt dissection was carried down to the pronator quadratus which was incised and retracted ulnarly  The plate was applied distally for starting with the proximal ulnar PEG a smooth PEG was placed followed by 2 threaded pegs.  We then placed 1 peg in the distal hole.  Radiographs for confirmed screw placement  We then brought the plate down to the cortex of the proximal radius and placed 1 screw using a cortical screw.  A second and third screw were placed.  Radiographs were taken again reduction was confirmed  The remaining pegs were filled except  for the last distal peg which was removed  Total of 6 pegs were placed distally and 3 cortical screws proximally  The wound was irrigated and closed with 2-0 Monocryl suture in a running fashion and applied Steri-Strips with benzoin and then volar splint  Tourniquet was released prior to applying the splint  Patient was extubated taken recovery room in stable condition   Assisted by Corrie Dandy  General anesthesia  Estimated blood loss 5 cc  No blood administered  No drains  Local medications Marcaine with epinephrine 20 cc  No specimens  Sponge and needle count correct   TOURNIQUET:   Total Tourniquet Time Documented: Upper Arm (Left) - 50 minutes Total: Upper Arm (Left) - 50 minutes   DICTATION: .[name] Dictation  PLAN OF CARE: Discharge to home after PACU  PATIENT DISPOSITION:  PACU - hemodynamically stable.   Delay start of Pharmacological VTE agent (>24hrs) due to surgical blood loss or risk of bleeding: not applicable

## 2019-09-05 NOTE — Anesthesia Procedure Notes (Signed)
Procedure Name: Intubation Date/Time: 09/05/2019 10:26 AM Performed by: Vista Deck, CRNA Pre-anesthesia Checklist: Patient identified, Patient being monitored, Timeout performed, Emergency Drugs available and Suction available Patient Re-evaluated:Patient Re-evaluated prior to induction Oxygen Delivery Method: Circle System Utilized Preoxygenation: Pre-oxygenation with 100% oxygen Induction Type: IV induction Ventilation: Mask ventilation without difficulty Laryngoscope Size: Miller and 2 Grade View: Grade I Tube type: Oral Tube size: 7.0 mm Number of attempts: 1 Airway Equipment and Method: Stylet Placement Confirmation: ETT inserted through vocal cords under direct vision,  positive ETCO2 and breath sounds checked- equal and bilateral Secured at: 21 cm Tube secured with: Tape Dental Injury: Teeth and Oropharynx as per pre-operative assessment

## 2019-09-05 NOTE — Discharge Instructions (Signed)

## 2019-09-05 NOTE — Anesthesia Preprocedure Evaluation (Signed)
Anesthesia Evaluation  Patient identified by MRN, date of birth, ID band Patient awake  General Assessment Comment:Pt reports sister has h/o PONV She hasn't ever had anesthesia problems   Reviewed: Allergy & Precautions, NPO status , Patient's Chart, lab work & pertinent test results  History of Anesthesia Complications (+) Family history of anesthesia reaction  Airway Mallampati: III  TM Distance: >3 FB Neck ROM: Full    Dental no notable dental hx. (+) Edentulous Upper   Pulmonary neg pulmonary ROS,    Pulmonary exam normal breath sounds clear to auscultation       Cardiovascular Exercise Tolerance: Good hypertension, Pt. on medications negative cardio ROS Normal cardiovascular examI Rhythm:Regular Rate:Normal     Neuro/Psych negative neurological ROS  negative psych ROS   GI/Hepatic Neg liver ROS, GERD  Medicated and Controlled,Denies GERD Sx today  Took GERD meds this am    Endo/Other  negative endocrine ROS  Renal/GU negative Renal ROS  negative genitourinary   Musculoskeletal negative musculoskeletal ROS (+)   Abdominal   Peds negative pediatric ROS (+)  Hematology negative hematology ROS (+)   Anesthesia Other Findings   Reproductive/Obstetrics negative OB ROS                             Anesthesia Physical Anesthesia Plan  ASA: II  Anesthesia Plan: General   Post-op Pain Management:    Induction: Intravenous  PONV Risk Score and Plan: 3 and Midazolam, Ondansetron, Dexamethasone and Treatment may vary due to age or medical condition  Airway Management Planned: Oral ETT  Additional Equipment:   Intra-op Plan:   Post-operative Plan: Extubation in OR  Informed Consent: I have reviewed the patients History and Physical, chart, labs and discussed the procedure including the risks, benefits and alternatives for the proposed anesthesia with the patient or authorized  representative who has indicated his/her understanding and acceptance.     Dental advisory given  Plan Discussed with: CRNA  Anesthesia Plan Comments: (Plan Full PPE use Plan GETA D/W PT -WTP with same after Q&A)        Anesthesia Quick Evaluation

## 2019-09-05 NOTE — Brief Op Note (Signed)
09/05/2019  11:41 AM  PATIENT:  Victoria Dominguez  68 y.o. female  PRE-OPERATIVE DIAGNOSIS:  left wrist fracture  POST-OPERATIVE DIAGNOSIS:  left wrist fracture  PROCEDURE:  Procedure(s): OPEN REDUCTION INTERNAL FIXATION (ORIF) WRIST FRACTURE (Left) CAST APPLICATION (Left) - 123456  Operative findings 2 part Colles' fracture dorsal angulation minimal displacement  Implants DVR plate with 6 pegs distally 2 of which were threaded and 3 cortical screws  SURGEON:  Surgeon(s) and Role:    Carole Civil, MD - Primary  Details of procedure The patient was evaluated preoperatively and found to be stable for surgery.  A thorough chart review image review was performed site confirmation left wrist performed with site marked  Patient taken to the operating room for general anesthesia and administered 2 g of Ancef  She was in the supine position  After sterile prep and drape and timeout the limb was exsanguinated with a 4 inch Esmarch the tourniquet was elevated to 250 mmHg  Close manipulation was performed and x-ray confirmed that the fracture was reducible without much manipulation  A volar approach to the wrist using the modified Mallie Mussel approach was performed.  After subcutaneous tissue was divided blunt dissection was carried out to the flexor carpi radialis tendon which was followed by opening the tendon sheath and retracting it radially  Blunt dissection was carried down to the pronator quadratus which was incised and retracted ulnarly  The plate was applied distally for starting with the proximal ulnar PEG a smooth PEG was placed followed by 2 threaded pegs.  We then placed 1 peg in the distal hole.  Radiographs for confirmed screw placement  We then brought the plate down to the cortex of the proximal radius and placed 1 screw using a cortical screw.  A second and third screw were placed.  Radiographs were taken again reduction was confirmed  The remaining pegs were filled except  for the last distal peg which was removed  Total of 6 pegs were placed distally and 3 cortical screws proximally  The wound was irrigated and closed with 2-0 Monocryl suture in a running fashion and applied Steri-Strips with benzoin and then volar splint  Tourniquet was released prior to applying the splint  Patient was extubated taken recovery room in stable condition   Assisted by Corrie Dandy  General anesthesia  Estimated blood loss 5 cc  No blood administered  No drains  Local medications Marcaine with epinephrine 20 cc  No specimens  Sponge and needle count correct   TOURNIQUET:   Total Tourniquet Time Documented: Upper Arm (Left) - 50 minutes Total: Upper Arm (Left) - 50 minutes   DICTATION: .[name] Dictation  PLAN OF CARE: Discharge to home after PACU  PATIENT DISPOSITION:  PACU - hemodynamically stable.   Delay start of Pharmacological VTE agent (>24hrs) due to surgical blood loss or risk of bleeding: not applicable

## 2019-09-05 NOTE — Interval H&P Note (Signed)
History and Physical Interval Note:  09/05/2019 10:15 AM  Victoria Dominguez  has presented today for surgery, with the diagnosis of left wrist fracture.  The various methods of treatment have been discussed with the patient and family. After consideration of risks, benefits and other options for treatment, the patient has consented to  Procedure(s): OPEN REDUCTION INTERNAL FIXATION (ORIF) WRIST FRACTURE (Left) as a surgical intervention.  The patient's history has been reviewed, patient examined, no change in status, stable for surgery.  I have reviewed the patient's chart and labs.  Questions were answered to the patient's satisfaction.     Arther Abbott

## 2019-09-06 ENCOUNTER — Encounter (HOSPITAL_COMMUNITY): Payer: Self-pay | Admitting: Orthopedic Surgery

## 2019-09-09 DIAGNOSIS — Z8781 Personal history of (healed) traumatic fracture: Secondary | ICD-10-CM | POA: Insufficient documentation

## 2019-09-09 DIAGNOSIS — Z9889 Other specified postprocedural states: Secondary | ICD-10-CM | POA: Insufficient documentation

## 2019-09-09 DIAGNOSIS — S52532A Colles' fracture of left radius, initial encounter for closed fracture: Secondary | ICD-10-CM | POA: Insufficient documentation

## 2019-09-13 ENCOUNTER — Ambulatory Visit (INDEPENDENT_AMBULATORY_CARE_PROVIDER_SITE_OTHER): Payer: Medicare Other | Admitting: Orthopedic Surgery

## 2019-09-13 ENCOUNTER — Encounter: Payer: Self-pay | Admitting: Orthopedic Surgery

## 2019-09-13 ENCOUNTER — Other Ambulatory Visit: Payer: Self-pay

## 2019-09-13 VITALS — Temp 97.5°F | Ht 65.5 in | Wt 133.0 lb

## 2019-09-13 DIAGNOSIS — S52532D Colles' fracture of left radius, subsequent encounter for closed fracture with routine healing: Secondary | ICD-10-CM

## 2019-09-13 DIAGNOSIS — Z8781 Personal history of (healed) traumatic fracture: Secondary | ICD-10-CM

## 2019-09-13 DIAGNOSIS — Z9889 Other specified postprocedural states: Secondary | ICD-10-CM

## 2019-09-13 NOTE — Progress Notes (Signed)
Chief Complaint  Patient presents with  . Routine Post Op    Lt ORIF DOS 09/05/19    This is postop day #8 status post volar plate left distal radius fracture.  Patient is doing well pain is well controlled no swelling she had a blister over her left thumb complains of some pain there  Her incision is clean dry and intact we placed a gauze wrap over it freshened up her Steri-Strips placed in a removable brace she will come back in a week for x-ray

## 2019-09-13 NOTE — Patient Instructions (Signed)
Drive ok for short distance

## 2019-09-20 ENCOUNTER — Encounter: Payer: Self-pay | Admitting: Orthopedic Surgery

## 2019-09-20 ENCOUNTER — Other Ambulatory Visit: Payer: Self-pay

## 2019-09-20 ENCOUNTER — Ambulatory Visit: Payer: Medicare Other

## 2019-09-20 ENCOUNTER — Ambulatory Visit (INDEPENDENT_AMBULATORY_CARE_PROVIDER_SITE_OTHER): Payer: Medicare Other | Admitting: Orthopedic Surgery

## 2019-09-20 DIAGNOSIS — Z9889 Other specified postprocedural states: Secondary | ICD-10-CM

## 2019-09-20 DIAGNOSIS — Z8781 Personal history of (healed) traumatic fracture: Secondary | ICD-10-CM | POA: Diagnosis not present

## 2019-09-20 DIAGNOSIS — S62102D Fracture of unspecified carpal bone, left wrist, subsequent encounter for fracture with routine healing: Secondary | ICD-10-CM

## 2019-09-20 NOTE — Patient Instructions (Signed)
Hand exercises   Splint   5 # weight limit   xrays in 4 weeks

## 2019-09-20 NOTE — Progress Notes (Signed)
Postop visit status post open treatment of fixation left wrist for distal radius fracture treated with DVR plate  Patient complains of some elbow discomfort and burning in her thumb but no numbness or tingling her wound looks clean she is neurovascularly intact she has some edema which we have asked her to work on with hand exercises  We gave her hand exercise program continued her removable splint and we will x-ray again in 4 weeks  Encounter Diagnoses  Name Primary?  . S/P ORIF (open reduction internal fixation) fracture 09/05/2019 left wrist  Yes  . Closed fracture of left wrist with routine healing, subsequent encounter

## 2019-10-03 ENCOUNTER — Telehealth: Payer: Self-pay | Admitting: Orthopedic Surgery

## 2019-10-03 NOTE — Telephone Encounter (Signed)
Patient called to ask about the swelling she has in her hand and fingers - offered appointment - please advise if any other recommendations other than what she has been doing - icing, hand exercises.

## 2019-10-03 NOTE — Telephone Encounter (Signed)
She is coming in tomorrow

## 2019-10-04 ENCOUNTER — Ambulatory Visit (INDEPENDENT_AMBULATORY_CARE_PROVIDER_SITE_OTHER): Payer: Medicare Other | Admitting: Orthopedic Surgery

## 2019-10-04 ENCOUNTER — Other Ambulatory Visit: Payer: Self-pay

## 2019-10-04 ENCOUNTER — Encounter: Payer: Self-pay | Admitting: Orthopedic Surgery

## 2019-10-04 ENCOUNTER — Ambulatory Visit: Payer: Medicare Other

## 2019-10-04 DIAGNOSIS — Z8781 Personal history of (healed) traumatic fracture: Secondary | ICD-10-CM | POA: Diagnosis not present

## 2019-10-04 DIAGNOSIS — S62102D Fracture of unspecified carpal bone, left wrist, subsequent encounter for fracture with routine healing: Secondary | ICD-10-CM

## 2019-10-04 DIAGNOSIS — Z9889 Other specified postprocedural states: Secondary | ICD-10-CM | POA: Diagnosis not present

## 2019-10-04 NOTE — Patient Instructions (Signed)
Start OT   Wear straps on brace loosely

## 2019-10-04 NOTE — Progress Notes (Signed)
Chief Complaint  Patient presents with  . Wrist Pain    09/05/2019 right hand wrist pain and swelling     Victoria Dominguez is postop 29 days after ORIF volar plate distal radius fracture with ulnar styloid fracture she was having some swelling pain and tingling on the dorsum of the hand and stiffness of the fingers   We brought her in for an unscheduled visit   Her examination shows a clean healed wound with no signs of infection she does have decreased extension of all the digits she has better flexion but not complete fist   He is none on the dorsum of the hand and when you look at her brace it is cutting across her dorsal sensory radial nerve   Adjusted the brace   We took an x-ray   wrist x-ray left  Status post ORIF distal radius  X-ray shows no evidence of hardware compromise fracture alignment is excellent  Impression stable internal fixation left wrist  Recommend start OT  Encounter Diagnoses  Name Primary?  . S/P ORIF (open reduction internal fixation) fracture 09/05/2019 left wrist  Yes  . Closed fracture of left wrist with routine healing, subsequent encounter     Keep appointment for the 30th for x-ray

## 2019-10-09 ENCOUNTER — Ambulatory Visit (HOSPITAL_COMMUNITY): Payer: Medicare Other | Attending: Orthopedic Surgery

## 2019-10-09 ENCOUNTER — Other Ambulatory Visit: Payer: Self-pay

## 2019-10-09 ENCOUNTER — Encounter (HOSPITAL_COMMUNITY): Payer: Self-pay

## 2019-10-09 DIAGNOSIS — M25632 Stiffness of left wrist, not elsewhere classified: Secondary | ICD-10-CM | POA: Insufficient documentation

## 2019-10-09 DIAGNOSIS — R29898 Other symptoms and signs involving the musculoskeletal system: Secondary | ICD-10-CM | POA: Insufficient documentation

## 2019-10-09 DIAGNOSIS — M25532 Pain in left wrist: Secondary | ICD-10-CM | POA: Insufficient documentation

## 2019-10-09 DIAGNOSIS — R278 Other lack of coordination: Secondary | ICD-10-CM | POA: Diagnosis present

## 2019-10-09 DIAGNOSIS — R601 Generalized edema: Secondary | ICD-10-CM | POA: Diagnosis present

## 2019-10-09 NOTE — Addendum Note (Signed)
Addended by: Ailene Ravel D on: 10/09/2019 05:26 PM   Modules accepted: Orders

## 2019-10-09 NOTE — Patient Instructions (Signed)
Home Exercises Program Theraputty Exercises  Do the following exercises 2-3 times a day using your affected hand.  1. Roll putty into a ball.  2. Make into a pancake.  3. Roll putty into a roll.  4. Pinch along log with first finger and thumb.   5. Make into a ball.  6. Roll it back into a log.   7. Pinch using thumb and side of first finger.  8. Roll into a ball, then flatten into a pancake.  9. Using your fingers, make putty into a mountain.  10. Roll putty into a ball and squeeze/release 10-12X  AROM: Wrist Flexion / Extension  Actively bend right wrist forward then back as far as possible. Repeat _10___ times per set. Do __1__ sets per session. Do __2-3__ sessions per day.  Copyright  [employer]HI. All rights reserved.  AROM: Wrist Radial / Ulnar Deviation   Gently bend left wrist from side to side as far as possible. Repeat _10___ times per set. Do _1___ sets per session. Do _2-3___ sessions per day.  Copyright  VHI. All rights reserved.  AROM: Forearm Pronation / Supination   With right arm in handshake position, slowly rotate palm down until stretch is felt. Relax. Then rotate palm up until stretch is felt. Repeat _10___ times per set. Do _1___ sets per session. Do __2-3__ sessions per day.  Copyright  [employer]HI. All rights reserved.     AROM: Finger Flexion / Extension   Actively bend fingers of right hand. Start with knuckles furthest from palm, and slowly make a fist. Hold __5__ seconds. Relax. Then straighten fingers as far as possible. Repeat __10__ times per set. Do _1___ sets per session. Do __2-3__ sessions per day.  Copyright  [employer]HI. All rights reserved.   Paper Crumpling Exercise   Begin with right palm down on piece of paper. Maintaining contact between surface and heel of hand, crumple paper into a ball. Repeat _5___ times per set. Do _1___ sets per session. Do _2-3___ sessions per day.  Copyright  [employer]. All rights reserved.        Abduction /  Adduction (Active)    With hand flat on table, spread all fingers apart, then bring them together as close as possible. Repeat 10__ times. Do _2-3___ sessions per day.  Copyright  [employer]HI. All rights reserved.  AROM: Thumb Abduction / Adduction   Actively pull right thumb away from palm as far as possible. Hold __5__ seconds. Then bring thumb back to touch fingers. Try not to bend fingers toward thumb. Repeat _10___ times per set. Do __1__ sets per session. Do _2-3___ sessions per day.  Copyright  VHI. All rights reserved.

## 2019-10-09 NOTE — Therapy (Signed)
[place], [place], [address] Phone: [phone]   Fax:  [phone]  Occupational Therapy Evaluation  Patient Details  Name: Victoria Dominguez MRN: [id number] Date of Birth: Mar 12, 1951 Referring Provider (OT): Arther Abbott, MD   Encounter Date: 10/09/2019  OT End of Session - 10/09/19 1639    Visit Number  1    Number of Visits  12    Date for OT Re-Evaluation  11/20/19    Authorization Type  medicare    Authorization Time Period  progress note at visit 10    Authorization - Visit Number  1    Authorization - Number of Visits  10    OT Start Time  1430    OT Stop Time  1508    OT Time Calculation (min)  38 min    Activity Tolerance  Patient tolerated treatment well    Behavior During Therapy  [facility] for tasks assessed/performed       Past Medical History:  Diagnosis Date  . Hypertension     Past Surgical History:  Procedure Laterality Date  . ABDOMINAL HYSTERECTOMY    . ORIF WRIST FRACTURE Left 09/05/2019   Procedure: OPEN REDUCTION INTERNAL FIXATION (ORIF) WRIST FRACTURE;  Surgeon: Carole Civil, MD;  Location: [facility];  Service: Orthopedics;  Laterality: Left;    There were no vitals filed for this visit.  Subjective Assessment - 10/09/19 1440    Subjective   S: I can close my hand now but I can't open it much.    Pertinent History  Patient is a 68 y/o female S/P left ORIF volar plate distal radius fracture with unlar styloid fracture; healed. Surgery was completed on 09/05/19 due to a mechanical fall. Dr. Aline Brochure has referred patient to occupational therapy for evaluation and treatment.    Special Tests  DASH    Patient Stated Goals  To be able to use my hand again.    Currently in Pain?  No/denies   3/10 when attempting to use or move it.       [facility] OT Assessment - 10/09/19 1436      Assessment   Medical Diagnosis  S/P ORIF left wrist    Referring Provider (OT)  Arther Abbott, MD     Onset Date/Surgical Date  09/05/19    Hand Dominance  Right    Next MD Visit  10/21/19    Prior Therapy  None for this injury      Precautions   Precautions  Other (comment)    Precaution Comments  No lifting over 3lbs.     Required Braces or Orthoses  Other Brace/Splint    Other Brace/Splint  Pre-fabricated resting hand brace. Wear full time unless bathing and completing HEP. Do not wean from splint until follow up with MD.       Restrictions   Weight Bearing Restrictions  Yes      Balance Screen   Has the patient fallen in the past 6 months  Yes    How many times?  1    Has the patient had a decrease in activity level because of a fear of falling?   No    Is the patient reluctant to leave their home because of a fear of falling?   No      Home  Environment   Family/patient expects to be discharged to:  Private residence    Living Arrangements  Spouse/significant other  Prior Function   Level of Independence  Independent    Vocation  Retired      ADL   ADL comments  Difficulty completing any task using her left hand. Difficulty with opening and closing hand.       Mobility   Mobility Status  Independent      Written Expression   Dominant Hand  Right      Vision - History   Baseline Vision  Wears glasses all the time      Cognition   Overall Cognitive Status  Within Functional Limits for tasks assessed      Coordination   9 Hole Peg Test  Left;Right    Right 9 Hole Peg Test  18.5"    Left 9 Hole Peg Test  26.6"      Edema   Edema  left wrist: 16.5 cm right: 15 cm. Right/Left MCP joint: 18.75 cm      ROM / Strength   AROM / PROM / Strength  AROM;PROM;Strength      Palpation   Palpation comment  Moderate fascial restrictions in left wrist, palm, fingers, and forearm      AROM   Overall AROM Comments  Measurements for digits were for extension.    AROM Assessment Site  Wrist;Finger;Forearm    Right/Left Forearm  Left    Left Forearm Pronation  60 Degrees     Left Forearm Supination  50 Degrees    Right/Left Wrist  Right    Right Wrist Extension  90 Degrees    Right Wrist Flexion  90 Degrees    Right Wrist Radial Deviation  46 Degrees    Right Wrist Ulnar Deviation  46 Degrees    Left Wrist Extension  30 Degrees    Left Wrist Flexion  30 Degrees    Left Wrist Radial Deviation  10 Degrees    Left Wrist Ulnar Deviation  26 Degrees    Right/Left Finger  Left      PROM   PROM Assessment Site  Wrist    Right/Left Wrist  Left    Left Wrist Extension  24 Degrees    Left Wrist Flexion  44 Degrees    Left Wrist Radial Deviation  30 Degrees    Left Wrist Ulnar Deviation  18 Degrees      Strength   Strength Assessment Site  Hand;Wrist;Forearm    Right/Left Forearm  Left    Left Forearm Pronation  3-/5    Left Forearm Supination  3-/5    Right/Left Wrist  Left    Left Wrist Flexion  3-/5    Left Wrist Extension  3-/5    Left Wrist Radial Deviation  3-/5    Left Wrist Ulnar Deviation  3-/5    Right/Left hand  Left;Right    Right Hand Grip (lbs)  65    Right Hand Lateral Pinch  16 lbs    Right Hand 3 Point Pinch  12 lbs    Left Hand Grip (lbs)  7    Left Hand Lateral Pinch  6 lbs    Left Hand 3 Point Pinch  3 lbs      Left Hand AROM   L Thumb MCP 0-60  48 Degrees    L Thumb IP 0-80  60 Degrees    L Thumb Radial ADduction/ABduction 0-55  56    L Index  MCP 0-90  30 Degrees    L Index PIP 0-100  28 Degrees  L Index DIP 0-70  0 Degrees    L Long  MCP 0-90  32 Degrees    L Long PIP 0-100  34 Degrees    L Long DIP 0-70  0 Degrees    L Ring  MCP 0-90  36 Degrees    L Ring PIP 0-100  48 Degrees    L Ring DIP 0-70  0 Degrees    L Little  MCP 0-90  30 Degrees    L Little PIP 0-100  16 Degrees    L Little DIP 0-70  0 Degrees          Quick Dash - 10/09/19 1440    Open a tight or new jar  Unable    Do heavy household chores (wash walls, wash floors)  Unable    Carry a shopping bag or briefcase  Unable    Wash your back  Unable     Use a knife to cut food  Unable    Recreational activities in which you take some force or impact through your arm, shoulder, or hand (golf, hammering, tennis)  Unable    During the past week, to what extent has your arm, shoulder or hand problem interfered with your normal social activities with family, friends, neighbors, or groups?  Not at all    During the past week, to what extent has your arm, shoulder or hand problem limited your work or other regular daily activities  Quite a bit    Arm, shoulder, or hand pain.  Moderate    Tingling (pins and needles) in your arm, shoulder, or hand  Extreme    Difficulty Sleeping  No difficulty   From the wrist. Patient has baseline sleeping issues.    DASH Score  75 %                 OT Education - 10/09/19 1639    Education Details  yellow theraputty for grip and pinch strengthening, A/ROM digits and wrist. pt provided with a small left hand edema glove. Educated on wearing schedule and care.    Person(s) Educated  Patient    Methods  Explanation;Demonstration;Verbal cues;Handout    Comprehension  Verbalized understanding       OT Short Term Goals - 10/09/19 1711      OT SHORT TERM GOAL #1   Title  Patient will be educated and independent with HEP in order to faciliate progress in therapy and allow her to return to using her Left UE as her non dominant extremity for 75% or more of daily tasks.    Time  3    Period  Weeks    Status  New    Target Date  10/30/19      OT SHORT TERM GOAL #2   Title  Patient will increase P/ROM to [facility] in order to begin using her left hand as an active assist with basic tasks such as turn the lightswitch on and off.    Time  3    Period  Weeks    Status  New        OT Long Term Goals - 10/09/19 1714      OT LONG TERM GOAL #1   Title  Patient will increase Left wrist and hand A/ROM to [facility] in order to be able to stabilize containers and jars when attempting to open them.    Time  6    Period   Weeks    Status  New    Target Date  11/20/19      OT LONG TERM GOAL #2   Title  Patient will increase her grip strength by 15# and her pinch strength by 6# in order to return to holding onto items without the needing to drop them.    Time  6    Period  Weeks    Status  New      OT LONG TERM GOAL #3   Title  Patient will increase her left hand coordination by completing the 9 hole peg test in 20 seconds or less.    Time  6    Period  Weeks    Status  New      OT LONG TERM GOAL #4   Title  Patient will increase her left wrist strength to 4/5 in order to pick up and carry lightweight household items with less items.    Time  6    Period  Weeks    Status  New      OT LONG TERM GOAL #5   Title  Patient will decrease fascial restrictions to min amount or less in order to increase the functional mobility needed to complete functional tasks such as grasping a doorknob to open.    Time  6    Period  Weeks    Status  New            Plan - 10/09/19 1701    Clinical Impression Statement  A: Patient is a 68 y/o female S/P left wrist ORIF causing increased pain, fascial restrictions, edema, and decreased coordination, strength, and ROM resulting in the inability to utilize her LUE for any daily tasks.    OT Occupational Profile and History  Problem Focused Assessment - Including review of records relating to presenting problem    Occupational performance deficits (Please refer to evaluation for details):  ADL's;IADL's;Leisure    Body Structure / Function / Physical Skills  ADL;UE functional use;Fascial restriction;Pain;FMC;ROM;GMC;Coordination;Decreased knowledge of precautions;Decreased knowledge of use of DME;Edema;Strength;IADL    Rehab Potential  Excellent    Clinical Decision Making  Limited treatment options, no task modification necessary    Comorbidities Affecting Occupational Performance:  None    Modification or Assistance to Complete Evaluation   No modification of tasks or  assist necessary to complete eval    OT Frequency  2x / week    OT Duration  6 weeks    OT Treatment/Interventions  Self-care/ADL training;Ultrasound;DME and/or AE instruction;Patient/family education;Paraffin;Passive range of motion;Cryotherapy;Electrical Stimulation;Moist Heat;Neuromuscular education;Therapeutic activities;Manual Therapy;Therapeutic exercise;Splinting    Plan  P: Patient will benefit from skilled OT services to increase functional performance during ADL tasks while returning to using her left UE as her non dominant extremity. Treatment Plan: Myofascial release, edema management, Passive stretching, P/ROM, AA/ROM, A/ROM, gentle strengthening, coordination. Follow up on use of edema glove,    Consulted and Agree with Plan of Care  Patient       Patient will benefit from skilled therapeutic intervention in order to improve the following deficits and impairments:   Body Structure / Function / Physical Skills: ADL, UE functional use, Fascial restriction, Pain, FMC, ROM, GMC, Coordination, Decreased knowledge of precautions, Decreased knowledge of use of DME, Edema, Strength, IADL       Visit Diagnosis: Pain in left wrist  Stiffness of left wrist, not elsewhere classified  Other lack of coordination  Generalized edema  Other symptoms and signs involving the musculoskeletal system    Problem  List Patient Active Problem List   Diagnosis Date Noted  . Fracture of left wrist with routine healing 10/04/2019  . Fracture, [place], left, closed 09/09/2019  . S/P ORIF (open reduction internal fixation) fracture 09/05/2019 left wrist  09/09/2019  . Left wrist fracture   . Hypertension 09/02/2019   Ailene Ravel, OTR/L,CBIS  [phone]  10/09/2019, 5:22 PM  [place] [address] [place], [place], [address] Phone: [phone]   Fax:  [phone]  Name: Kasy Mitter MRN: [id number] Date of Birth: 1951-09-11

## 2019-10-14 ENCOUNTER — Encounter (HOSPITAL_COMMUNITY): Payer: Self-pay | Admitting: Specialist

## 2019-10-14 ENCOUNTER — Other Ambulatory Visit: Payer: Self-pay

## 2019-10-14 ENCOUNTER — Ambulatory Visit (HOSPITAL_COMMUNITY): Payer: Medicare Other | Admitting: Specialist

## 2019-10-14 DIAGNOSIS — M25532 Pain in left wrist: Secondary | ICD-10-CM

## 2019-10-14 DIAGNOSIS — R29898 Other symptoms and signs involving the musculoskeletal system: Secondary | ICD-10-CM

## 2019-10-14 DIAGNOSIS — R278 Other lack of coordination: Secondary | ICD-10-CM

## 2019-10-14 DIAGNOSIS — R601 Generalized edema: Secondary | ICD-10-CM

## 2019-10-14 DIAGNOSIS — M25632 Stiffness of left wrist, not elsewhere classified: Secondary | ICD-10-CM

## 2019-10-14 NOTE — Patient Instructions (Signed)
Contrast Bath  Prepare the baths.  Cold = 55-65 degrees     Hot = 105-110 degrees  Starting with the hot, dip hand or foot all the way into the water and hold there for selected duration.  Preferably 3 minutes.  After selected duration is up, dip hand or foot into the cold for 1/3 duration of the hot. (3 minutes hot, 1 minute cold)  Alternate back and forth for the times indicated for no more than a total of 20 minutes ending with hot.   

## 2019-10-14 NOTE — Therapy (Signed)
[place], [place], [address] Phone: [phone]   Fax:  [phone]  Occupational Therapy Treatment  Patient Details  Name: Victoria Dominguez MRN: [id number] Date of Birth: [birth year] Referring Provider (OT): Arther Abbott, MD   Encounter Date: 10/14/2019  OT End of Session - 10/14/19 1344    Visit Number  3    Number of Visits  12    Date for OT Re-Evaluation  11/20/19    Authorization Type  medicare    Authorization Time Period  progress note at visit 10    Authorization - Visit Number  3    Authorization - Number of Visits  10    OT Start Time  1030    OT Stop Time  1116    OT Time Calculation (min)  46 min    Activity Tolerance  Patient tolerated treatment well    Behavior During Therapy  [facility] for tasks assessed/performed       Past Medical History:  Diagnosis Date  . Hypertension     Past Surgical History:  Procedure Laterality Date  . ABDOMINAL HYSTERECTOMY    . ORIF WRIST FRACTURE Left 09/05/2019   Procedure: OPEN REDUCTION INTERNAL FIXATION (ORIF) WRIST FRACTURE;  Surgeon: Carole Civil, MD;  Location: [facility];  Service: Orthopedics;  Laterality: Left;    There were no vitals filed for this visit.  Subjective Assessment - 10/14/19 1343    Subjective   S: I'm not able to do the putty exercises. I tried but couldn't.    Currently in Pain?  Yes    Pain Score  5     Pain Location  Wrist    Pain Orientation  Lateral    Pain Descriptors / Indicators  Aching;Sore         [facility] OT Assessment - 10/14/19 0001      Assessment   Medical Diagnosis  S/P ORIF left wrist    Referring Provider (OT)  Arther Abbott, MD    Prior Therapy  None for this injury      Precautions   Precautions  Other (comment)    Precaution Comments  No lifting over 3lbs.     Required Braces or Orthoses  Other Brace/Splint    Other Brace/Splint  Pre-fabricated resting hand brace. Wear full time unless bathing and  completing HEP. Do not wean from splint until follow up with MD.                OT Treatments/Exercises (OP) - 10/14/19 0001      Exercises   Exercises  Wrist;Elbow;Hand      Wrist Exercises   Wrist Flexion  PROM;AROM;10 reps    Wrist Extension  PROM;AROM;10 reps    Wrist Radial Deviation  PROM;AROM;10 reps    Wrist Ulnar Deviation  PROM;AROM;10 reps      Additional Wrist Exercises   Sponges  attempted X5, able to pick up 3, 5, 3. 6.6       Hand Exercises   MCPJ Flexion  AROM;5 reps    PIPJ Flexion  AROM;5 reps    DIPJ Flexion  AROM;5 reps    Joint Blocking Exercises  5 times a/prom    Digit Composite ABduction  AROM;5 reps      Manual Therapy   Manual Therapy  Myofascial release;Edema management    Manual therapy comments  completed seperately from all other interventions this date    Edema Management  retrograde and edema  massage completed this date to dec    Myofascial Release  myofascial release and manual stretching to decrease fascial restrictions and improve pain free mobility in left forearm, wrist, and hand      Fine Motor Coordination (Hand/Wrist)   Fine Motor Coordination  Tendon glides    Tendon Glides  a/rom X 5              OT Education - 10/14/19 1343    Education Details  Reviewed therapy goals. Discussed contrast bath technique for HEP to reduce edema. Verbally discussed completing a scar massage at home as well.    Person(s) Educated  Patient    Methods  Explanation;Demonstration;Handout    Comprehension  Verbalized understanding       OT Short Term Goals - 10/14/19 1357      OT SHORT TERM GOAL #1   Title  Patient will be educated and independent with HEP in order to faciliate progress in therapy and allow her to return to using her Left UE as her non dominant extremity for 75% or more of daily tasks.    Time  3    Period  Weeks    Status  On-going    Target Date  10/30/19      OT SHORT TERM GOAL #2   Title  Patient will increase  P/ROM to [facility] in order to begin using her left hand as an active assist with basic tasks such as turn the lightswitch on and off.    Time  3    Period  Weeks    Status  On-going        OT Long Term Goals - 10/14/19 1049      OT LONG TERM GOAL #1   Title  Patient will increase Left wrist and hand A/ROM to [facility] in order to be able to stabilize containers and jars when attempting to open them.    Time  6    Period  Weeks    Status  On-going      OT LONG TERM GOAL #2   Title  Patient will increase her grip strength by 15# and her pinch strength by 6# in order to return to holding onto items without the needing to drop them.    Time  6    Period  Weeks    Status  On-going      OT LONG TERM GOAL #3   Title  Patient will increase her left hand coordination by completing the 9 hole peg test in 20 seconds or less.    Time  6    Period  Weeks    Status  On-going      OT LONG TERM GOAL #4   Title  Patient will increase her left wrist strength to 4/5 in order to pick up and carry lightweight household items with less items.    Time  6    Period  Weeks    Status  On-going      OT LONG TERM GOAL #5   Title  Patient will decrease fascial restrictions to min amount or less in order to increase the functional mobility needed to complete functional tasks such as grasping a doorknob to open.    Time  6    Period  Weeks    Status  On-going            Plan - 10/14/19 1344    Clinical Impression Statement  A:  Patient presents with increased  edema and limited functional mobility in her left forearm, wrist, hand this date.  Significant portion of therapy session focused on manual therapy intervention in order to decrease edema and improve mobility and functional use.  Patient able to pick up 6 sponges on 2 attempts this date.    Plan  P:  follow up on HEP, complete scar debridement, continue to improve functional mobility in wrist and forearm and hand, issue tendon glides for HEP.     Consulted and Agree with Plan of Care  Patient       Patient will benefit from skilled therapeutic intervention in order to improve the following deficits and impairments:           Visit Diagnosis: Pain in left wrist  Stiffness of left wrist, not elsewhere classified  Other lack of coordination  Other symptoms and signs involving the musculoskeletal system  Generalized edema    Problem List Patient Active Problem List   Diagnosis Date Noted  . Fracture of left wrist with routine healing 10/04/2019  . Fracture, [place], left, closed 09/09/2019  . S/P ORIF (open reduction internal fixation) fracture 09/05/2019 left wrist  09/09/2019  . Left wrist fracture   . Hypertension 09/02/2019    Vangie Bicker, Vass, OTR/L [phone]  10/14/2019, 2:21 PM  [place], [place], [address] Phone: [phone]   Fax:  [phone]  Name: Pachia Hanauer MRN: [id number] Date of Birth: 1951/03/15

## 2019-10-15 ENCOUNTER — Encounter (HOSPITAL_COMMUNITY): Payer: Self-pay | Admitting: Occupational Therapy

## 2019-10-15 ENCOUNTER — Ambulatory Visit (HOSPITAL_COMMUNITY): Payer: Medicare Other | Admitting: Occupational Therapy

## 2019-10-15 DIAGNOSIS — M25532 Pain in left wrist: Secondary | ICD-10-CM | POA: Diagnosis not present

## 2019-10-15 DIAGNOSIS — R278 Other lack of coordination: Secondary | ICD-10-CM

## 2019-10-15 DIAGNOSIS — R29898 Other symptoms and signs involving the musculoskeletal system: Secondary | ICD-10-CM

## 2019-10-15 DIAGNOSIS — R601 Generalized edema: Secondary | ICD-10-CM

## 2019-10-15 DIAGNOSIS — M25632 Stiffness of left wrist, not elsewhere classified: Secondary | ICD-10-CM

## 2019-10-15 NOTE — Therapy (Signed)
[place] [address] [place], [place], [address] Phone: [phone]   Fax:  [phone]  Occupational Therapy Treatment  Patient Details  Name: Victoria Dominguez MRN: [id number] Date of Birth: 1951-04-16 Referring Provider (OT): Arther Abbott, MD   Encounter Date: 10/15/2019  OT End of Session - 10/15/19 1736    Visit Number  4    Number of Visits  12    Date for OT Re-Evaluation  11/20/19    Authorization Type  medicare    Authorization Time Period  progress note at visit 10    Authorization - Visit Number  4    Authorization - Number of Visits  10    OT Start Time  [id number]    OT Stop Time  1730    OT Time Calculation (min)  45 min    Activity Tolerance  Patient tolerated treatment well    Behavior During Therapy  [facility] for tasks assessed/performed       Past Medical History:  Diagnosis Date  . Hypertension     Past Surgical History:  Procedure Laterality Date  . ABDOMINAL HYSTERECTOMY    . ORIF WRIST FRACTURE Left 09/05/2019   Procedure: OPEN REDUCTION INTERNAL FIXATION (ORIF) WRIST FRACTURE;  Surgeon: Carole Civil, MD;  Location: [facility];  Service: Orthopedics;  Laterality: Left;    There were no vitals filed for this visit.  Subjective Assessment - 10/15/19 1640    Subjective   S: It's sore today.    Currently in Pain?  Yes    Pain Score  6     Pain Location  Wrist    Pain Orientation  Lateral    Pain Descriptors / Indicators  Aching;Sore    Pain Type  Acute pain    Pain Radiating Towards  n/a    Pain Onset  Yesterday    Pain Frequency  Intermittent    Aggravating Factors   exercises    Pain Relieving Factors  pain medication    Effect of Pain on Daily Activities  max effect on ADLs    Multiple Pain Sites  No         [facility] OT Assessment - 10/15/19 1640      Assessment   Medical Diagnosis  S/P ORIF left wrist    Referring Provider (OT)  Arther Abbott, MD      Precautions   Precautions  Other  (comment)    Precaution Comments  No lifting over 3lbs.     Required Braces or Orthoses  Other Brace/Splint    Other Brace/Splint  Pre-fabricated resting hand brace. Wear full time unless bathing and completing HEP. Do not wean from splint until follow up with MD.                OT Treatments/Exercises (OP) - 10/15/19 1720      Exercises   Exercises  Wrist;Elbow;Hand      Elbow Exercises   Forearm Supination  AROM;10 reps    Forearm Pronation  AROM;10 reps      Wrist Exercises   Wrist Flexion  PROM;AROM;10 reps    Wrist Extension  PROM;AROM;10 reps    Wrist Radial Deviation  PROM;AROM;10 reps    Wrist Ulnar Deviation  PROM;AROM;10 reps      Additional Wrist Exercises   Sponges  5, 5, 6      Hand Exercises   MCPJ Flexion  AROM;10 reps    PIPJ Flexion  AROM;10 reps  DIPJ Flexion  AROM;10 reps      Manual Therapy   Manual Therapy  Myofascial release;Edema management    Manual therapy comments  completed seperately from all other interventions this date    Edema Management  retrograde and edema massage completed this date to dec    Myofascial Release  myofascial release and manual stretching to decrease fascial restrictions and improve pain free mobility in left forearm, wrist, and hand      Fine Motor Coordination (Hand/Wrist)   Fine Motor Coordination  Tendon glides    Tendon Glides  a/rom X 5                OT Short Term Goals - 10/14/19 1357      OT SHORT TERM GOAL #1   Title  Patient will be educated and independent with HEP in order to faciliate progress in therapy and allow her to return to using her Left UE as her non dominant extremity for 75% or more of daily tasks.    Time  3    Period  Weeks    Status  On-going    Target Date  10/30/19      OT SHORT TERM GOAL #2   Title  Patient will increase P/ROM to [facility] in order to begin using her left hand as an active assist with basic tasks such as turn the lightswitch on and off.    Time  3     Period  Weeks    Status  On-going        OT Long Term Goals - 10/14/19 1049      OT LONG TERM GOAL #1   Title  Patient will increase Left wrist and hand A/ROM to [facility] in order to be able to stabilize containers and jars when attempting to open them.    Time  6    Period  Weeks    Status  On-going      OT LONG TERM GOAL #2   Title  Patient will increase her grip strength by 15# and her pinch strength by 6# in order to return to holding onto items without the needing to drop them.    Time  6    Period  Weeks    Status  On-going      OT LONG TERM GOAL #3   Title  Patient will increase her left hand coordination by completing the 9 hole peg test in 20 seconds or less.    Time  6    Period  Weeks    Status  On-going      OT LONG TERM GOAL #4   Title  Patient will increase her left wrist strength to 4/5 in order to pick up and carry lightweight household items with less items.    Time  6    Period  Weeks    Status  On-going      OT LONG TERM GOAL #5   Title  Patient will decrease fascial restrictions to min amount or less in order to increase the functional mobility needed to complete functional tasks such as grasping a doorknob to open.    Time  6    Period  Weeks    Status  On-going            Plan - 10/15/19 1736    Clinical Impression Statement  A: Pt reports increased soreness after yesterday's session. Debridement of dry skin at scar completed at beginning of session,  minimal skin removed as skin was peeling however was deeply attached. Continued with manual techniques and passive stretching, improvement in mobility in wrist and digits noted with increased repetitions. Verbal cuing for form and technique with A/ROM exercises.    Body Structure / Function / Physical Skills  ADL;UE functional use;Fascial restriction;Pain;FMC;ROM;GMC;Coordination;Decreased knowledge of precautions;Decreased knowledge of use of DME;Edema;Strength;IADL    Plan  P: Follow up on MD appt,  continue with tendon glides and add to HEP, continue working ot improve mobility required for functional tasks in wrist and digits. Attempt putty tasks       Patient will benefit from skilled therapeutic intervention in order to improve the following deficits and impairments:   Body Structure / Function / Physical Skills: ADL, UE functional use, Fascial restriction, Pain, FMC, ROM, GMC, Coordination, Decreased knowledge of precautions, Decreased knowledge of use of DME, Edema, Strength, IADL       Visit Diagnosis: Pain in left wrist  Stiffness of left wrist, not elsewhere classified  Other lack of coordination  Other symptoms and signs involving the musculoskeletal system  Generalized edema    Problem List Patient Active Problem List   Diagnosis Date Noted  . Fracture of left wrist with routine healing 10/04/2019  . Fracture, [place], left, closed 09/09/2019  . S/P ORIF (open reduction internal fixation) fracture 09/05/2019 left wrist  09/09/2019  . Left wrist fracture   . Hypertension 09/02/2019   Guadelupe Sabin, OTR/L  [phone] 10/15/2019, 5:40 PM  [place] [address] [place], [place], [address] Phone: [phone]   Fax:  [phone]  Name: Victoria Dominguez MRN: [id number] Date of Birth: 08-Feb-1951

## 2019-10-21 ENCOUNTER — Other Ambulatory Visit: Payer: Self-pay

## 2019-10-21 ENCOUNTER — Encounter: Payer: Self-pay | Admitting: Orthopedic Surgery

## 2019-10-21 ENCOUNTER — Ambulatory Visit (INDEPENDENT_AMBULATORY_CARE_PROVIDER_SITE_OTHER): Payer: Medicare Other

## 2019-10-21 ENCOUNTER — Ambulatory Visit (INDEPENDENT_AMBULATORY_CARE_PROVIDER_SITE_OTHER): Payer: Medicare Other | Admitting: Orthopedic Surgery

## 2019-10-21 DIAGNOSIS — Z9889 Other specified postprocedural states: Secondary | ICD-10-CM | POA: Diagnosis not present

## 2019-10-21 DIAGNOSIS — S52572D Other intraarticular fracture of lower end of left radius, subsequent encounter for closed fracture with routine healing: Secondary | ICD-10-CM

## 2019-10-21 DIAGNOSIS — Z8781 Personal history of (healed) traumatic fracture: Secondary | ICD-10-CM | POA: Diagnosis not present

## 2019-10-21 NOTE — Patient Instructions (Addendum)
START 500 MG VIT c DAILY   CONTINUE THERAPY    Complex Regional Pain Syndrome Complex regional pain syndrome (CRPS) is a nerve disorder that causes long-term (chronic) pain. This is usually in a hand, arm, foot, or leg. CRPS usually occurs after an injury or trauma, such as a fracture or sprain. There are two types of CRPS:  Type 1. This type occurs after an injury with no known damage to a nerve.  Type 2. This type occurs after an injury that damages a nerve. There are three stages of the condition:  Stage 1. This stage, called the acute stage, may last for up to 3 months.  Stage 2. This stage, called the dystrophic stage, may last for 3-12 months.  Stage 3. This stage, called the atrophic stage, may start after one year. CRPS ranges from mild to severe. For most people, CRPS is mild and recovery happens over time. For others, CRPS lasts for a very long time and makes everyday tasks hard to do. What are the causes? The exact cause of this condition is not known. It is usually triggered by an injury. What increases the risk? You are more likely to develop this condition if:  You are female.  You have any of the following injuries: ? A wrist fracture that involves a lower arm bone (distal radius fracture). ? Ankle dislocation or fracture. ? A long surgery time. ? Possible nerve injury during surgery. What are the signs or symptoms? Signs and symptoms in the affected hand, arm, foot, or leg are different for each stage. Signs and symptoms of stage 1 include:  Burning pain.  A prickling, tingling feeling (pins and needles sensation).  Extremely sensitive skin.  Swelling.  Joint stiffness.  Warmth and redness.  Excessive sweating.  Hair and nail growth that is faster than normal. Signs and symptoms of stage 2 include:  Spreading of pain to the whole arm or leg.  Increased skin sensitivity.  Increased swelling and stiffness.  Coolness of the skin.  Blue  discoloration of skin.  Loss of skin wrinkles.  Brittle fingernails. Signs and symptoms of stage 3 include:  Pain that spreads to other areas of the body but becomes less severe.  More stiffness, leading to loss of motion.  Skin that is pale, dry, shiny, or tightly stretched. How is this diagnosed? This condition may be diagnosed based on:  Your signs and symptoms.  A physical exam. There is no test to diagnose CRPS, but you may have tests:  To check for bone changes that might indicate CRPS. These tests may include an MRI or bone scan.  To rule out other possible causes of your symptoms. How is this treated? Early treatment may prevent CRPS from advancing past stage 1. There is not one treatment that works for everyone. Treatment options may include:  Medicines, which may include: ? NSAIDs, such as ibuprofen. ? Steroids. ? Blood pressure drugs. ? Antidepressants. ? Anti-seizure drugs. ? Pain relievers.  Exercise.  Occupational therapy (OT) and physical therapy (PT).  Biofeedback.  Mental health counseling.  Numbing injections.  Spinal surgery to implant a spinal cord stimulator or a pain pump. Follow these instructions at home: Medicines  Take over-the-counter and prescription medicines only as told by your health care provider.  Do not drive or use heavy machinery while taking prescription pain medicine.  If you are taking prescription pain medicine, take actions to prevent or treat constipation. Your health care provider may recommend that you: ?  Drink enough fluid to keep your urine pale yellow. ? Eat foods that are high in fiber, such as fresh fruits and vegetables, whole grains, and beans. ? Limit foods that are high in fat and processed sugars, such as fried or sweet foods. ? Take an over-the-counter or prescription medicine for constipation. General instructions  Do not use any products that contain nicotine or tobacco, such as cigarettes and  e-cigarettes. If you need help quitting, ask your health care provider.  Maintain a healthy weight.  Return to your normal activities as told by your health care provider. Ask your health care provider what activities are safe for you.  Follow an exercise program as directed by your health care provider.  Keep all follow-up visits as told by your health care provider. This is important. Contact a health care provider if:  Your symptoms change.  Your symptoms get worse.  You develop anxiety or depression. Summary  Complex regional pain syndrome (CRPS) is a nerve disorder that causes long-term (chronic) pain, usually in a hand, arm, leg, or foot.  CRPS usually occurs after an injury or trauma, such as a fracture or sprain.  CRPS ranges from mild to severe. Early treatment may prevent CRPS from advancing to more severe stages. This information is not intended to replace advice given to you by your health care provider. Make sure you discuss any questions you have with your health care provider. Document Released: 10/28/2002 Document Revised: 10/20/2017 Document Reviewed: 10/02/2017 Elsevier Patient Education  2020 [employer].

## 2019-10-21 NOTE — Progress Notes (Signed)
Postop visit status post ORIF of the wrist on September 05, 2019  Chief Complaint  Patient presents with  . Routine Post Op    left wrist still painful swollen / going to therapy helps some    Patient complaining of numbness tingling severe stiffness she is getting contractures in the small joints of the hand her wound looks great her x-ray looks great she has had 2 OT visits not much change to date  Concern for possible RSD.  I am not going to make an official diagnosis of that at this point but I will start the vitamin C.  Recommend continue splinting therapy start vitamin C 500 mg daily follow-up in 4 weeks Encounter Diagnosis  Name Primary?  . S/P ORIF (open reduction internal fixation) fracture 09/05/2019 left wrist  Yes

## 2019-10-23 ENCOUNTER — Other Ambulatory Visit: Payer: Self-pay

## 2019-10-23 ENCOUNTER — Encounter (HOSPITAL_COMMUNITY): Payer: Self-pay

## 2019-10-23 ENCOUNTER — Ambulatory Visit (HOSPITAL_COMMUNITY): Payer: Medicare Other | Attending: Orthopedic Surgery

## 2019-10-23 DIAGNOSIS — R278 Other lack of coordination: Secondary | ICD-10-CM

## 2019-10-23 DIAGNOSIS — M25532 Pain in left wrist: Secondary | ICD-10-CM | POA: Insufficient documentation

## 2019-10-23 DIAGNOSIS — R29898 Other symptoms and signs involving the musculoskeletal system: Secondary | ICD-10-CM | POA: Insufficient documentation

## 2019-10-23 DIAGNOSIS — R601 Generalized edema: Secondary | ICD-10-CM | POA: Diagnosis not present

## 2019-10-23 DIAGNOSIS — M25632 Stiffness of left wrist, not elsewhere classified: Secondary | ICD-10-CM | POA: Diagnosis present

## 2019-10-23 NOTE — Therapy (Signed)
[place], [place], [address] Phone: [phone]   Fax:  [phone]  Occupational Therapy Treatment  Patient Details  Name: Victoria Dominguez MRN: [id number] Date of Birth: 1951/06/24 Referring Provider (OT): Arther Abbott, MD   Encounter Date: 10/23/2019  OT End of Session - 10/23/19 2131    Visit Number  5    Number of Visits  12    Date for OT Re-Evaluation  11/20/19    Authorization Type  medicare    Authorization Time Period  progress note at visit 10    Authorization - Visit Number  5    Authorization - Number of Visits  10    OT Start Time  1300    OT Stop Time  1343    OT Time Calculation (min)  43 min    Activity Tolerance  Patient tolerated treatment well    Behavior During Therapy  [facility] for tasks assessed/performed       Past Medical History:  Diagnosis Date  . Hypertension     Past Surgical History:  Procedure Laterality Date  . ABDOMINAL HYSTERECTOMY    . ORIF WRIST FRACTURE Left 09/05/2019   Procedure: OPEN REDUCTION INTERNAL FIXATION (ORIF) WRIST FRACTURE;  Surgeon: Carole Civil, MD;  Location: [facility];  Service: Orthopedics;  Laterality: Left;    There were no vitals filed for this visit.  Subjective Assessment - 10/23/19 2126    Subjective   S: The doctor seems to be concerned with there possibly being an infection.    Currently in Pain?  No/denies         [facility] OT Assessment - 10/23/19 2127      Assessment   Medical Diagnosis  S/P ORIF left wrist      Precautions   Precautions  Other (comment)    Precaution Comments  No lifting over 3lbs.     Required Braces or Orthoses  Other Brace/Splint    Other Brace/Splint  Pre-fabricated resting hand brace. Wear full time unless bathing and completing HEP. Do not wean from splint until follow up with MD.                OT Treatments/Exercises (OP) - 10/23/19 2127      Exercises   Exercises  Wrist;Hand;Elbow      Elbow Exercises   Forearm Supination  PROM;10 reps    Forearm Pronation  PROM;10 reps      Wrist Exercises   Wrist Flexion  PROM;10 reps    Wrist Extension  PROM;10 reps    Wrist Radial Deviation  PROM;10 reps    Wrist Ulnar Deviation  PROM;10 reps      Hand Exercises   MCPJ Flexion  PROM;10 reps    MCPJ Extension  PROM;10 reps    PIPJ Flexion  PROM;10 reps    PIPJ Extension  PROM;10 reps    DIPJ Flexion  PROM;10 reps    DIPJ Extension  PROM;10 reps      Manual Therapy   Manual Therapy  Myofascial release;Joint mobilization;Muscle Energy Technique    Manual therapy comments  completed seperately from all other interventions this date    Edema Management  Edema reducing completed to LUE from elbow to hand.    Joint Mobilization  Joint mobilization techniques completed to left forearm, wrist and hand.    Myofascial Release  myofascial release and manual stretching to decrease fascial restrictions and improve pain free mobility in left forearm, wrist,  and hand               OT Short Term Goals - 10/14/19 1357      OT SHORT TERM GOAL #1   Title  Patient will be educated and independent with HEP in order to faciliate progress in therapy and allow her to return to using her Left UE as her non dominant extremity for 75% or more of daily tasks.    Time  3    Period  Weeks    Status  On-going    Target Date  10/30/19      OT SHORT TERM GOAL #2   Title  Patient will increase P/ROM to [facility] in order to begin using her left hand as an active assist with basic tasks such as turn the lightswitch on and off.    Time  3    Period  Weeks    Status  On-going        OT Long Term Goals - 10/14/19 1049      OT LONG TERM GOAL #1   Title  Patient will increase Left wrist and hand A/ROM to [facility] in order to be able to stabilize containers and jars when attempting to open them.    Time  6    Period  Weeks    Status  On-going      OT LONG TERM GOAL #2   Title  Patient will increase  her grip strength by 15# and her pinch strength by 6# in order to return to holding onto items without the needing to drop them.    Time  6    Period  Weeks    Status  On-going      OT LONG TERM GOAL #3   Title  Patient will increase her left hand coordination by completing the 9 hole peg test in 20 seconds or less.    Time  6    Period  Weeks    Status  On-going      OT LONG TERM GOAL #4   Title  Patient will increase her left wrist strength to 4/5 in order to pick up and carry lightweight household items with less items.    Time  6    Period  Weeks    Status  On-going      OT LONG TERM GOAL #5   Title  Patient will decrease fascial restrictions to min amount or less in order to increase the functional mobility needed to complete functional tasks such as grasping a doorknob to open.    Time  6    Period  Weeks    Status  On-going            Plan - 10/23/19 2132    Clinical Impression Statement  A: Massage cream used during manual therapy techniques while focusing session on edema reduction techniques in order to increase joint mobility. Patient tolerated techniques and light passive stretching. Demonstrates increased soreness/pain during passive stretching of IP joints. Discussed use of coconut oil to provide moisture to skin and decrease about of dead skin.    Body Structure / Function / Physical Skills  ADL;UE functional use;Fascial restriction;Pain;FMC;ROM;GMC;Coordination;Decreased knowledge of precautions;Decreased knowledge of use of DME;Edema;Strength;IADL    Plan  P: Focus on edema reduction techniques in order to increase mobility of left hand prior to attempting any type of strengthening technique.    Consulted and Agree with Plan of Care  Patient  Patient will benefit from skilled therapeutic intervention in order to improve the following deficits and impairments:   Body Structure / Function / Physical Skills: ADL, UE functional use, Fascial restriction, Pain,  FMC, ROM, GMC, Coordination, Decreased knowledge of precautions, Decreased knowledge of use of DME, Edema, Strength, IADL       Visit Diagnosis: Generalized edema  Pain in left wrist  Stiffness of left wrist, not elsewhere classified  Other lack of coordination  Other symptoms and signs involving the musculoskeletal system    Problem List Patient Active Problem List   Diagnosis Date Noted  . Fracture of left wrist with routine healing 10/04/2019  . Fracture, [place], left, closed 09/09/2019  . S/P ORIF (open reduction internal fixation) fracture 09/05/2019 left wrist  09/09/2019  . Left wrist fracture   . Hypertension 09/02/2019   Ailene Ravel, OTR/L,CBIS  [phone]   10/23/2019, 9:35 PM  [place] [address] [place], [place], [address] Phone: [phone]   Fax:  [phone]  Name: Victoria Dominguez MRN: [id number] Date of Birth: February 03, 1951

## 2019-10-25 ENCOUNTER — Ambulatory Visit (HOSPITAL_COMMUNITY): Payer: Medicare Other

## 2019-10-25 ENCOUNTER — Other Ambulatory Visit: Payer: Self-pay

## 2019-10-25 ENCOUNTER — Encounter (HOSPITAL_COMMUNITY): Payer: Self-pay

## 2019-10-25 DIAGNOSIS — R601 Generalized edema: Secondary | ICD-10-CM

## 2019-10-25 DIAGNOSIS — R278 Other lack of coordination: Secondary | ICD-10-CM

## 2019-10-25 DIAGNOSIS — M25632 Stiffness of left wrist, not elsewhere classified: Secondary | ICD-10-CM

## 2019-10-25 DIAGNOSIS — R29898 Other symptoms and signs involving the musculoskeletal system: Secondary | ICD-10-CM

## 2019-10-25 DIAGNOSIS — M25532 Pain in left wrist: Secondary | ICD-10-CM

## 2019-10-25 NOTE — Therapy (Signed)
[place], [place], [address] Phone: [phone]   Fax:  [phone]  Occupational Therapy Treatment  Patient Details  Name: Victoria Dominguez MRN: [id number] Date of Birth: [birth year] Referring Provider (OT): Arther Abbott, MD   Encounter Date: 10/25/2019  OT End of Session - 10/25/19 1135    Visit Number  6    Number of Visits  12    Date for OT Re-Evaluation  11/20/19    Authorization Type  medicare    Authorization Time Period  progress note at visit 10    Authorization - Visit Number  6    Authorization - Number of Visits  10    OT Start Time  1030    OT Stop Time  1111    OT Time Calculation (min)  41 min    Activity Tolerance  Patient tolerated treatment well    Behavior During Therapy  [facility] for tasks assessed/performed       Past Medical History:  Diagnosis Date  . Hypertension     Past Surgical History:  Procedure Laterality Date  . ABDOMINAL HYSTERECTOMY    . ORIF WRIST FRACTURE Left 09/05/2019   Procedure: OPEN REDUCTION INTERNAL FIXATION (ORIF) WRIST FRACTURE;  Surgeon: Carole Civil, MD;  Location: [facility];  Service: Orthopedics;  Laterality: Left;    There were no vitals filed for this visit.  Subjective Assessment - 10/25/19 1134    Subjective   S: I had some coconut oil on hand at home and I've been lathering that on. It's looking so much better since I started doing that.    Currently in Pain?  No/denies         [facility] OT Assessment - 10/25/19 1135      Assessment   Medical Diagnosis  S/P ORIF left wrist      Precautions   Precautions  Other (comment)    Precaution Comments  No lifting over 3lbs.     Required Braces or Orthoses  Other Brace/Splint    Other Brace/Splint  Pre-fabricated resting hand brace. Wear full time unless bathing and completing HEP. Do not wean from splint until follow up with MD.                OT Treatments/Exercises (OP) - 10/25/19 1135       Exercises   Exercises  Wrist;Hand;Elbow      Hand Exercises   MCPJ Flexion  PROM;10 reps    MCPJ Extension  PROM;10 reps    PIPJ Flexion  PROM;10 reps    PIPJ Extension  PROM;10 reps    DIPJ Flexion  PROM;10 reps    DIPJ Extension  PROM;10 reps      Manual Therapy   Manual Therapy  Myofascial release;Joint mobilization;Muscle Energy Technique    Manual therapy comments  completed seperately from all other interventions this date    Edema Management  Edema reducing completed to LUE from elbow to hand.    Joint Mobilization  Joint mobilization techniques completed to left forearm, wrist and hand.    Myofascial Release  myofascial release and manual stretching to decrease fascial restrictions and improve pain free mobility in left forearm, wrist, and hand               OT Short Term Goals - 10/14/19 1357      OT SHORT TERM GOAL #1   Title  Patient will be educated and independent with HEP in order to  faciliate progress in therapy and allow her to return to using her Left UE as her non dominant extremity for 75% or more of daily tasks.    Time  3    Period  Weeks    Status  On-going    Target Date  10/30/19      OT SHORT TERM GOAL #2   Title  Patient will increase P/ROM to [facility] in order to begin using her left hand as an active assist with basic tasks such as turn the lightswitch on and off.    Time  3    Period  Weeks    Status  On-going        OT Long Term Goals - 10/14/19 1049      OT LONG TERM GOAL #1   Title  Patient will increase Left wrist and hand A/ROM to [facility] in order to be able to stabilize containers and jars when attempting to open them.    Time  6    Period  Weeks    Status  On-going      OT LONG TERM GOAL #2   Title  Patient will increase her grip strength by 15# and her pinch strength by 6# in order to return to holding onto items without the needing to drop them.    Time  6    Period  Weeks    Status  On-going      OT LONG TERM GOAL #3    Title  Patient will increase her left hand coordination by completing the 9 hole peg test in 20 seconds or less.    Time  6    Period  Weeks    Status  On-going      OT LONG TERM GOAL #4   Title  Patient will increase her left wrist strength to 4/5 in order to pick up and carry lightweight household items with less items.    Time  6    Period  Weeks    Status  On-going      OT LONG TERM GOAL #5   Title  Patient will decrease fascial restrictions to min amount or less in order to increase the functional mobility needed to complete functional tasks such as grasping a doorknob to open.    Time  6    Period  Weeks    Status  On-going            Plan - 10/25/19 1136    Clinical Impression Statement  A: Continued with use of massage cream during manual techniques to complete edema reduction techniques followed by myofascial release and manual stretching. Patient presents with slightly less edema than previous session. Able to achieve slightly more passive ROM for pointer and middle and thumb. Increased pain in ring PIP joint prevented her from tolerating any significant P/ROM. All MCP joint of 2nd-5th digit able to achieve approximately 90 degrees of flexion passively.    Body Structure / Function / Physical Skills  ADL;UE functional use;Fascial restriction;Pain;FMC;ROM;GMC;Coordination;Decreased knowledge of precautions;Decreased knowledge of use of DME;Edema;Strength;IADL    Plan  P: Focus on edema reduction techniques in order to increase mobility of left hand prior to attempting any type of strengthening technique. Measure edema next session.    Consulted and Agree with Plan of Care  Patient       Patient will benefit from skilled therapeutic intervention in order to improve the following deficits and impairments:   Body Structure / Function / Physical  Skills: ADL, UE functional use, Fascial restriction, Pain, FMC, ROM, GMC, Coordination, Decreased knowledge of precautions, Decreased  knowledge of use of DME, Edema, Strength, IADL       Visit Diagnosis: Generalized edema  Pain in left wrist  Stiffness of left wrist, not elsewhere classified  Other lack of coordination  Other symptoms and signs involving the musculoskeletal system    Problem List Patient Active Problem List   Diagnosis Date Noted  . Fracture of left wrist with routine healing 10/04/2019  . Fracture, [place], left, closed 09/09/2019  . S/P ORIF (open reduction internal fixation) fracture 09/05/2019 left wrist  09/09/2019  . Left wrist fracture   . Hypertension 09/02/2019   Ailene Ravel, OTR/L,CBIS  [phone]  10/25/2019, 11:38 AM  [place] [address] [place], [place], [address] Phone: [phone]   Fax:  [phone]  Name: Victoria Dominguez MRN: [id number] Date of Birth: 1950/12/30

## 2019-10-28 ENCOUNTER — Ambulatory Visit (HOSPITAL_COMMUNITY): Payer: Medicare Other

## 2019-10-28 ENCOUNTER — Encounter (HOSPITAL_COMMUNITY): Payer: Self-pay

## 2019-10-28 ENCOUNTER — Other Ambulatory Visit: Payer: Self-pay

## 2019-10-28 DIAGNOSIS — R29898 Other symptoms and signs involving the musculoskeletal system: Secondary | ICD-10-CM

## 2019-10-28 DIAGNOSIS — R601 Generalized edema: Secondary | ICD-10-CM

## 2019-10-28 DIAGNOSIS — R278 Other lack of coordination: Secondary | ICD-10-CM

## 2019-10-28 DIAGNOSIS — M25532 Pain in left wrist: Secondary | ICD-10-CM

## 2019-10-28 DIAGNOSIS — M25632 Stiffness of left wrist, not elsewhere classified: Secondary | ICD-10-CM

## 2019-10-28 NOTE — Therapy (Signed)
[place], [place], [address] Phone: [phone]   Fax:  [phone]  Occupational Therapy Treatment  Patient Details  Name: Victoria Dominguez MRN: [id number] Date of Birth: [birth year] Referring Provider (OT): Arther Abbott, MD   Encounter Date: 10/28/2019  OT End of Session - 10/28/19 1933    Visit Number  7    Number of Visits  12    Date for OT Re-Evaluation  11/20/19    Authorization Type  medicare    Authorization Time Period  progress note at visit 10    Authorization - Visit Number  7    Authorization - Number of Visits  10    OT Start Time  [id number]    OT Stop Time  1553    OT Time Calculation (min)  38 min    Activity Tolerance  Patient tolerated treatment well    Behavior During Therapy  [facility] for tasks assessed/performed       Past Medical History:  Diagnosis Date  . Hypertension     Past Surgical History:  Procedure Laterality Date  . ABDOMINAL HYSTERECTOMY    . ORIF WRIST FRACTURE Left 09/05/2019   Procedure: OPEN REDUCTION INTERNAL FIXATION (ORIF) WRIST FRACTURE;  Surgeon: Carole Civil, MD;  Location: [facility];  Service: Orthopedics;  Laterality: Left;    There were no vitals filed for this visit.  Subjective Assessment - 10/28/19 1928    Subjective   S: Nothing has improved. I'm still trying to get my wrist moving.    Currently in Pain?  No/denies   pain during passive ROM stretching.        [facility] OT Assessment - 10/28/19 1930      Assessment   Medical Diagnosis  S/P ORIF left wrist      Precautions   Precautions  Other (comment)    Precaution Comments  No lifting over 3lbs.     Required Braces or Orthoses  Other Brace/Splint    Other Brace/Splint  Pre-fabricated resting hand brace. Wear full time unless bathing and completing HEP. Do not wean from splint until follow up with MD.                OT Treatments/Exercises (OP) - 10/28/19 1930      Exercises   Exercises   Hand      Hand Exercises   MCPJ Flexion  PROM;10 reps    MCPJ Extension  PROM;10 reps    PIPJ Flexion  PROM;10 reps    PIPJ Extension  PROM;10 reps    DIPJ Flexion  PROM;10 reps    DIPJ Extension  PROM;10 reps      Manual Therapy   Manual Therapy  Myofascial release;Joint mobilization;Muscle Energy Technique    Manual therapy comments  completed seperately from all other interventions this date    Edema Management  Edema reducing completed to LUE from elbow to hand.    Joint Mobilization  Joint mobilization techniques completed to left forearm, wrist and hand.    Myofascial Release  myofascial release and manual stretching to decrease fascial restrictions and improve pain free mobility in left forearm, wrist, and hand             OT Education - 10/28/19 1932    Education Details  flexion glove small/medium left hand. education on donn/doff, wearing schedule.    Person(s) Educated  Patient    Methods  Explanation;Demonstration    Comprehension  Verbalized understanding;Returned demonstration  OT Short Term Goals - 10/14/19 1357      OT SHORT TERM GOAL #1   Title  Patient will be educated and independent with HEP in order to faciliate progress in therapy and allow her to return to using her Left UE as her non dominant extremity for 75% or more of daily tasks.    Time  3    Period  Weeks    Status  On-going    Target Date  10/30/19      OT SHORT TERM GOAL #2   Title  Patient will increase P/ROM to [facility] in order to begin using her left hand as an active assist with basic tasks such as turn the lightswitch on and off.    Time  3    Period  Weeks    Status  On-going        OT Long Term Goals - 10/14/19 1049      OT LONG TERM GOAL #1   Title  Patient will increase Left wrist and hand A/ROM to [facility] in order to be able to stabilize containers and jars when attempting to open them.    Time  6    Period  Weeks    Status  On-going      OT LONG TERM GOAL #2   Title   Patient will increase her grip strength by 15# and her pinch strength by 6# in order to return to holding onto items without the needing to drop them.    Time  6    Period  Weeks    Status  On-going      OT LONG TERM GOAL #3   Title  Patient will increase her left hand coordination by completing the 9 hole peg test in 20 seconds or less.    Time  6    Period  Weeks    Status  On-going      OT LONG TERM GOAL #4   Title  Patient will increase her left wrist strength to 4/5 in order to pick up and carry lightweight household items with less items.    Time  6    Period  Weeks    Status  On-going      OT LONG TERM GOAL #5   Title  Patient will decrease fascial restrictions to min amount or less in order to increase the functional mobility needed to complete functional tasks such as grasping a doorknob to open.    Time  6    Period  Weeks    Status  On-going            Plan - 10/28/19 1935    Clinical Impression Statement  A: Pt presents with cold fingers and discolorasion in the left hand digits. Continues to have increased pain and decreased tolerance during passive ROM stretching. Continued with use of massage cream during manual techniques. Provided flexion glove for use at home to further aid with increasing flexion of digits.    Body Structure / Function / Physical Skills  ADL;UE functional use;Fascial restriction;Pain;FMC;ROM;GMC;Coordination;Decreased knowledge of precautions;Decreased knowledge of use of DME;Edema;Strength;IADL    Plan  P: follow up on use of flexion glove. Increase wearing schedule if able. Measure edema next session.    Consulted and Agree with Plan of Care  Patient       Patient will benefit from skilled therapeutic intervention in order to improve the following deficits and impairments:   Body Structure / Function / Physical  Skills: ADL, UE functional use, Fascial restriction, Pain, FMC, ROM, GMC, Coordination, Decreased knowledge of precautions,  Decreased knowledge of use of DME, Edema, Strength, IADL       Visit Diagnosis: Pain in left wrist  Generalized edema  Stiffness of left wrist, not elsewhere classified  Other lack of coordination  Other symptoms and signs involving the musculoskeletal system    Problem List Patient Active Problem List   Diagnosis Date Noted  . Fracture of left wrist with routine healing 10/04/2019  . Fracture, [place], left, closed 09/09/2019  . S/P ORIF (open reduction internal fixation) fracture 09/05/2019 left wrist  09/09/2019  . Left wrist fracture   . Hypertension 09/02/2019   Ailene Ravel, OTR/L,CBIS  [phone]  10/28/2019, 7:39 PM  [place] [address] [place], [place], [address] Phone: [phone]   Fax:  [phone]  Name: Victoria Dominguez MRN: [id number] Date of Birth: October 26, 1951

## 2019-10-30 ENCOUNTER — Encounter (HOSPITAL_COMMUNITY): Payer: Self-pay

## 2019-10-30 ENCOUNTER — Ambulatory Visit (HOSPITAL_COMMUNITY): Payer: Medicare Other

## 2019-10-30 ENCOUNTER — Other Ambulatory Visit: Payer: Self-pay

## 2019-10-30 DIAGNOSIS — R601 Generalized edema: Secondary | ICD-10-CM

## 2019-10-30 DIAGNOSIS — R29898 Other symptoms and signs involving the musculoskeletal system: Secondary | ICD-10-CM

## 2019-10-30 DIAGNOSIS — R278 Other lack of coordination: Secondary | ICD-10-CM

## 2019-10-30 DIAGNOSIS — M25632 Stiffness of left wrist, not elsewhere classified: Secondary | ICD-10-CM

## 2019-10-30 DIAGNOSIS — M25532 Pain in left wrist: Secondary | ICD-10-CM

## 2019-10-30 NOTE — Therapy (Signed)
[place], [place], [address] Phone: [phone]   Fax:  [phone]  Occupational Therapy Treatment  Patient Details  Name: Victoria Dominguez MRN: [id number] Date of Birth: 08-03-1951 Referring Provider (OT): Arther Abbott, MD   Encounter Date: 10/30/2019  OT End of Session - 10/30/19 1513    Visit Number  8    Number of Visits  12    Date for OT Re-Evaluation  11/20/19    Authorization Type  medicare    Authorization Time Period  progress note at visit 10    Authorization - Visit Number  8    Authorization - Number of Visits  10    OT Start Time  [id number]    OT Stop Time  1111    OT Time Calculation (min)  39 min    Activity Tolerance  Patient tolerated treatment well    Behavior During Therapy  [facility] for tasks assessed/performed       Past Medical History:  Diagnosis Date  . Hypertension     Past Surgical History:  Procedure Laterality Date  . ABDOMINAL HYSTERECTOMY    . ORIF WRIST FRACTURE Left 09/05/2019   Procedure: OPEN REDUCTION INTERNAL FIXATION (ORIF) WRIST FRACTURE;  Surgeon: Carole Civil, MD;  Location: [facility];  Service: Orthopedics;  Laterality: Left;    There were no vitals filed for this visit.  Subjective Assessment - 10/30/19 1510    Subjective   S:  I'm was able to wear my glove for 30 minutes last night. I was so happy!    Currently in Pain?  No/denies   Only during passive stretching.        [facility] OT Assessment - 10/30/19 1511      Assessment   Medical Diagnosis  S/P ORIF left wrist      Precautions   Precautions  Other (comment)    Precaution Comments  No lifting over 3lbs.     Required Braces or Orthoses  Other Brace/Splint      Edema   Edema  left wrist: 15.6 cm (eval: 16.5 cm)               OT Treatments/Exercises (OP) - 10/30/19 1511      Exercises   Exercises  Wrist;Hand;Elbow      Elbow Exercises   Forearm Supination  PROM;10 reps    Forearm  Pronation  PROM;10 reps      Wrist Exercises   Wrist Flexion  PROM;10 reps    Wrist Extension  PROM;10 reps    Wrist Radial Deviation  PROM;10 reps    Wrist Ulnar Deviation  PROM;10 reps      Hand Exercises   MCPJ Flexion  PROM;10 reps    MCPJ Extension  PROM;10 reps    PIPJ Flexion  PROM;10 reps    PIPJ Extension  PROM;10 reps    DIPJ Flexion  PROM;10 reps    DIPJ Extension  PROM;10 reps    Thumb Opposition  5X thumb to base of small digit.    Other Hand Exercises  Finger tip pinch grasp focused on with small beads. Utilized each finger with thumb to pick up and place 10 beads.       Manual Therapy   Manual Therapy  Myofascial release;Joint mobilization;Muscle Energy Technique    Manual therapy comments  completed seperately from all other interventions this date    Edema Management  Edema reducing completed to LUE from elbow to  hand.    Joint Mobilization  Joint mobilization techniques completed to left forearm, wrist and hand.    Myofascial Release  myofascial release and manual stretching to decrease fascial restrictions and improve pain free mobility in left forearm, wrist, and hand               OT Short Term Goals - 10/14/19 1357      OT SHORT TERM GOAL #1   Title  Patient will be educated and independent with HEP in order to faciliate progress in therapy and allow her to return to using her Left UE as her non dominant extremity for 75% or more of daily tasks.    Time  3    Period  Weeks    Status  On-going    Target Date  10/30/19      OT SHORT TERM GOAL #2   Title  Patient will increase P/ROM to [facility] in order to begin using her left hand as an active assist with basic tasks such as turn the lightswitch on and off.    Time  3    Period  Weeks    Status  On-going        OT Long Term Goals - 10/14/19 1049      OT LONG TERM GOAL #1   Title  Patient will increase Left wrist and hand A/ROM to [facility] in order to be able to stabilize containers and jars when  attempting to open them.    Time  6    Period  Weeks    Status  On-going      OT LONG TERM GOAL #2   Title  Patient will increase her grip strength by 15# and her pinch strength by 6# in order to return to holding onto items without the needing to drop them.    Time  6    Period  Weeks    Status  On-going      OT LONG TERM GOAL #3   Title  Patient will increase her left hand coordination by completing the 9 hole peg test in 20 seconds or less.    Time  6    Period  Weeks    Status  On-going      OT LONG TERM GOAL #4   Title  Patient will increase her left wrist strength to 4/5 in order to pick up and carry lightweight household items with less items.    Time  6    Period  Weeks    Status  On-going      OT LONG TERM GOAL #5   Title  Patient will decrease fascial restrictions to min amount or less in order to increase the functional mobility needed to complete functional tasks such as grasping a doorknob to open.    Time  6    Period  Weeks    Status  On-going            Plan - 10/30/19 1513    Clinical Impression Statement  A: Pt reports increased wearing tolerance with flexion glove. Edema measured and has decreased since initial evaluation. Continued with use of massage cream to complete manual techniques to decrease edema and restrictions and increase joint mobility. Completed fine motor activity with small beads to focus on increased functional use of thumb during pinching tasks.    Body Structure / Function / Physical Skills  ADL;UE functional use;Fascial restriction;Pain;FMC;ROM;GMC;Coordination;Decreased knowledge of precautions;Decreased knowledge of use of DME;Edema;Strength;IADL  Plan  P: Continue with use of manual techniques to decrease edema and increase joint mobility. Increase resistance on flexion glove if needed. Complete sponge task and/or washcloth crumble.    Consulted and Agree with Plan of Care  Patient       Patient will benefit from skilled  therapeutic intervention in order to improve the following deficits and impairments:   Body Structure / Function / Physical Skills: ADL, UE functional use, Fascial restriction, Pain, FMC, ROM, GMC, Coordination, Decreased knowledge of precautions, Decreased knowledge of use of DME, Edema, Strength, IADL       Visit Diagnosis: Stiffness of left wrist, not elsewhere classified  Other lack of coordination  Other symptoms and signs involving the musculoskeletal system  Generalized edema  Pain in left wrist    Problem List Patient Active Problem List   Diagnosis Date Noted  . Fracture of left wrist with routine healing 10/04/2019  . Fracture, [place], left, closed 09/09/2019  . S/P ORIF (open reduction internal fixation) fracture 09/05/2019 left wrist  09/09/2019  . Left wrist fracture   . Hypertension 09/02/2019   Ailene Ravel, OTR/L,CBIS  [phone]  10/30/2019, 3:16 PM  [facility] [address] [place], [place], [address] Phone: [phone]   Fax:  [phone]  Name: Sameka Gasper MRN: [id number] Date of Birth: 03-05-1951

## 2019-11-04 ENCOUNTER — Other Ambulatory Visit: Payer: Self-pay

## 2019-11-04 ENCOUNTER — Encounter (HOSPITAL_COMMUNITY): Payer: Self-pay

## 2019-11-04 ENCOUNTER — Ambulatory Visit (HOSPITAL_COMMUNITY): Payer: Medicare Other

## 2019-11-04 DIAGNOSIS — R278 Other lack of coordination: Secondary | ICD-10-CM

## 2019-11-04 DIAGNOSIS — R601 Generalized edema: Secondary | ICD-10-CM

## 2019-11-04 DIAGNOSIS — M25532 Pain in left wrist: Secondary | ICD-10-CM

## 2019-11-04 DIAGNOSIS — M25632 Stiffness of left wrist, not elsewhere classified: Secondary | ICD-10-CM

## 2019-11-04 DIAGNOSIS — R29898 Other symptoms and signs involving the musculoskeletal system: Secondary | ICD-10-CM

## 2019-11-04 NOTE — Therapy (Addendum)
[place], [place], [address] Phone: [phone]   Fax:  [phone]  Occupational Therapy Treatment  Patient Details  Name: Victoria Dominguez MRN: [id number] Date of Birth: Aug 21, 1951 Referring Provider (OT): Arther Abbott, MD  Progress Note Reporting Period 10/09/2019 to 11/04/2019  See note below for Objective Data and Assessment of Progress/Goals.       Encounter Date: 11/04/2019  OT End of Session - 11/04/19 1546    Visit Number  9    Number of Visits  12    Date for OT Re-Evaluation  11/20/19    Authorization Type  medicare    Authorization Time Period  progress note at visit 3    Authorization - Visit Number  9    Authorization - Number of Visits  19    OT Start Time  1430   mini reassessment   OT Stop Time  1522    OT Time Calculation (min)  52 min    Activity Tolerance  Patient tolerated treatment well    Behavior During Therapy  WFL for tasks assessed/performed       Past Medical History:  Diagnosis Date  . Hypertension     Past Surgical History:  Procedure Laterality Date  . ABDOMINAL HYSTERECTOMY    . ORIF WRIST FRACTURE Left 09/05/2019   Procedure: OPEN REDUCTION INTERNAL FIXATION (ORIF) WRIST FRACTURE;  Surgeon: Carole Civil, MD;  Location: [facility];  Service: Orthopedics;  Laterality: Left;    There were no vitals filed for this visit.  Subjective Assessment - 11/04/19 1537    Subjective   S: I'm able to wear my glove 3 times a day for 30 minutes.    Currently in Pain?  No/denies   only during passive ROM.        [facility] OT Assessment - 11/04/19 1453      Assessment   Medical Diagnosis  S/P ORIF left wrist    Referring Provider (OT)  Arther Abbott, MD      Precautions   Precautions  Other (comment)    Precaution Comments  No lifting over 3lbs.     Required Braces or Orthoses  Other Brace/Splint    Other Brace/Splint  Pre-fabricated resting hand brace. Wear full  time unless bathing and completing HEP. Do not wean from splint until follow up with MD.       Coordination   9 Hole Peg Test  Left    Left 9 Hole Peg Test  29.7"   previous: 26.6"     AROM   Overall AROM Comments  Measurements for digits were for extension.    AROM Assessment Site  Wrist    Left Forearm Pronation  90 Degrees   previous: 60   Left Forearm Supination  58 Degrees   previous: 50   Right/Left Wrist  Left    Left Wrist Extension  30 Degrees   previous: same   Left Wrist Flexion  44 Degrees   previous: 30   Left Wrist Radial Deviation  12 Degrees   previous: 10   Left Wrist Ulnar Deviation  26 Degrees   previous: same     PROM   PROM Assessment Site  Forearm;Wrist    Right/Left Wrist  Left    Left Wrist Extension  40 Degrees   previous: 24   Left Wrist Flexion  56 Degrees   previous: 44   Left Wrist Radial Deviation  40 Degrees   previous:  30   Left Wrist Ulnar Deviation  20 Degrees   previous: 18     Strength   Strength Assessment Site  Forearm;Wrist    Right/Left Forearm  Left    Left Forearm Pronation  3/5   previous: 3-/5   Left Forearm Supination  3-/5   previous: same   Right/Left Wrist  Left    Left Wrist Flexion  3-/5   previous: same   Left Wrist Extension  3-/5   previous: same   Left Wrist Radial Deviation  3-/5   previous: same   Left Wrist Ulnar Deviation  3-/5   previous: same   Left Hand Grip (lbs)  7   previous: same   Left Hand Lateral Pinch  7 lbs   previous: 6   Left Hand 3 Point Pinch  5 lbs   previous: 3     Left Hand AROM   L Index  MCP 0-90  20 Degrees   previous: 30   L Index PIP 0-100  8 Degrees   previous: 28   L Long  MCP 0-90  18 Degrees   previous: 32   L Long PIP 0-100  10 Degrees   previous: 34   L Ring  MCP 0-90  30 Degrees   previous: 36   L Ring PIP 0-100  28 Degrees   previous: 48   L Little  MCP 0-90  0 Degrees   previous: 30   L Little PIP 0-100  10 Degrees   previous: 16      A/ROM note  assessed prior to this date. Left Hand AROM- Flexion  L Thumb MCP 0-60  42 Degrees   L Thumb IP 0-80  46 Degrees       L Index  MCP 0-90  56 Degrees   L Index PIP 0-100  56 Degrees   L Index DIP 0-70  58 Degrees   L Long  MCP 0-90  66 Degrees   L Long PIP 0-100  64 Degrees   L Long DIP 0-70  46 Degrees   L Ring  MCP 0-90  58 Degrees   L Ring PIP 0-100  68 Degrees   L Ring DIP 0-70  48 Degrees   L Little  MCP 0-90  36 Degrees   L Little PIP 0-100  74 Degrees   L Little DIP 0-70  46 Degrees      Quick Dash - 11/04/19 0001    Open a tight or new jar  Unable    Do heavy household chores (wash walls, wash floors)  Unable    Carry a shopping bag or briefcase  Moderate difficulty    Wash your back  Unable    Use a knife to cut food  Unable    Recreational activities in which you take some force or impact through your arm, shoulder, or hand (golf, hammering, tennis)  Severe difficulty    During the past week, to what extent has your arm, shoulder or hand problem interfered with your normal social activities with family, friends, neighbors, or groups?  Not at all    During the past week, to what extent has your arm, shoulder or hand problem limited your work or other regular daily activities  Modererately    Arm, shoulder, or hand pain.  Moderate    Tingling (pins and needles) in your arm, shoulder, or hand  Moderate    Difficulty Sleeping  No difficulty   related to  wrist   DASH Score  61.36 %                   OT Education - 11/04/19 1545    Education Details  Pt provided with lef thand edema glove; education on care instructions. Increased resistance on rubberbands for flexion glove.    Person(s) Educated  Patient    Methods  Explanation    Comprehension  Verbalized understanding       OT Short Term Goals - 10/14/19 1357      OT SHORT TERM GOAL #1   Title  Patient will be educated and independent with HEP in order to faciliate progress in therapy and allow her to  return to using her Left UE as her non dominant extremity for 75% or more of daily tasks.    Time  3    Period  Weeks    Status  On-going    Target Date  10/30/19      OT SHORT TERM GOAL #2   Title  Patient will increase P/ROM to [facility] in order to begin using her left hand as an active assist with basic tasks such as turn the lightswitch on and off.    Time  3    Period  Weeks    Status  On-going        OT Long Term Goals - 10/14/19 1049      OT LONG TERM GOAL #1   Title  Patient will increase Left wrist and hand A/ROM to [facility] in order to be able to stabilize containers and jars when attempting to open them.    Time  6    Period  Weeks    Status  On-going      OT LONG TERM GOAL #2   Title  Patient will increase her grip strength by 15# and her pinch strength by 6# in order to return to holding onto items without the needing to drop them.    Time  6    Period  Weeks    Status  On-going      OT LONG TERM GOAL #3   Title  Patient will increase her left hand coordination by completing the 9 hole peg test in 20 seconds or less.    Time  6    Period  Weeks    Status  On-going      OT LONG TERM GOAL #4   Title  Patient will increase her left wrist strength to 4/5 in order to pick up and carry lightweight household items with less items.    Time  6    Period  Weeks    Status  On-going      OT LONG TERM GOAL #5   Title  Patient will decrease fascial restrictions to min amount or less in order to increase the functional mobility needed to complete functional tasks such as grasping a doorknob to open.    Time  6    Period  Weeks    Status  On-going            Plan - 11/04/19 1547    Clinical Impression Statement  A: 10th visit progress note completed this date. Patient has made improvement with passive and A/ROM measurements for her left wrist and hand. Coordination and hand strength has made minimal improvement since we have been focusing primarily on ROM prior to  strengthening. Patient has recently received a flexion glove to continue working on increase  hand ROM and requested her rubberbands be tightened at this session. Pt reports that she was able to fold laundry today. She has been completing simple meal prep tasks. She continues to attempt to use her theraputty although has not been able to complete exercises at this time. Focus has been on decreasing edema while increasing ROM of her hand and wrist.    Body Structure / Function / Physical Skills  ADL;UE functional use;Fascial restriction;Pain;FMC;ROM;GMC;Coordination;Decreased knowledge of precautions;Decreased knowledge of use of DME;Edema;Strength;IADL    Plan  P: Continue with use of manual techniques to decrease edema and increase joint mobility. Complete sponge task and/or washcloth crumble. Attempt wrist flexion/extension stretch and provide for HEP if appropriate.    Consulted and Agree with Plan of Care  Patient       Patient will benefit from skilled therapeutic intervention in order to improve the following deficits and impairments:   Body Structure / Function / Physical Skills: ADL, UE functional use, Fascial restriction, Pain, FMC, ROM, GMC, Coordination, Decreased knowledge of precautions, Decreased knowledge of use of DME, Edema, Strength, IADL       Visit Diagnosis: Stiffness of left wrist, not elsewhere classified  Other lack of coordination  Other symptoms and signs involving the musculoskeletal system  Generalized edema  Pain in left wrist    Problem List Patient Active Problem List   Diagnosis Date Noted  . Fracture of left wrist with routine healing 10/04/2019  . Fracture, [place], left, closed 09/09/2019  . S/P ORIF (open reduction internal fixation) fracture 09/05/2019 left wrist  09/09/2019  . Left wrist fracture   . Hypertension 09/02/2019   Ailene Ravel, OTR/L,CBIS  [phone]  11/04/2019, 3:52 PM  [facility] [address] [place], [place], [address] Phone: [phone]   Fax:  [phone]  Name: Sumiya Deakin MRN: [id number] Date of Birth: [birth year]

## 2019-11-05 ENCOUNTER — Ambulatory Visit (HOSPITAL_COMMUNITY): Payer: Medicare Other

## 2019-11-05 ENCOUNTER — Encounter (HOSPITAL_COMMUNITY): Payer: Self-pay

## 2019-11-05 DIAGNOSIS — R601 Generalized edema: Secondary | ICD-10-CM

## 2019-11-05 DIAGNOSIS — M25532 Pain in left wrist: Secondary | ICD-10-CM

## 2019-11-05 DIAGNOSIS — M25632 Stiffness of left wrist, not elsewhere classified: Secondary | ICD-10-CM

## 2019-11-05 DIAGNOSIS — R29898 Other symptoms and signs involving the musculoskeletal system: Secondary | ICD-10-CM

## 2019-11-05 DIAGNOSIS — R278 Other lack of coordination: Secondary | ICD-10-CM

## 2019-11-05 NOTE — Therapy (Signed)
[place], [place], [address] Phone: [phone]   Fax:  [phone]  Occupational Therapy Treatment  Patient Details  Name: Victoria Dominguez MRN: [id number] Date of Birth: 04-Jun-1951 Referring Provider (OT): Arther Abbott, MD   Encounter Date: 11/05/2019  OT End of Session - 11/05/19 1645    Visit Number  10    Number of Visits  12    Date for OT Re-Evaluation  11/20/19    Authorization Type  medicare    Authorization Time Period  progress note at visit 71    Authorization - Visit Number  10    Authorization - Number of Visits  19    OT Start Time  1515    OT Stop Time  1555    OT Time Calculation (min)  40 min    Activity Tolerance  Patient tolerated treatment well    Behavior During Therapy  [facility] for tasks assessed/performed       Past Medical History:  Diagnosis Date  . Hypertension     Past Surgical History:  Procedure Laterality Date  . ABDOMINAL HYSTERECTOMY    . ORIF WRIST FRACTURE Left 09/05/2019   Procedure: OPEN REDUCTION INTERNAL FIXATION (ORIF) WRIST FRACTURE;  Surgeon: Carole Civil, MD;  Location: [facility];  Service: Orthopedics;  Laterality: Left;    There were no vitals filed for this visit.  Subjective Assessment - 11/05/19 1537    Subjective   S: I tried wearing the glove for 55 minutes but it was too much. I was able to wear it for 30 minutes twice this morning.    Currently in Pain?  No/denies   only with passive stretching.        [facility] OT Assessment - 11/05/19 1539      Assessment   Medical Diagnosis  S/P ORIF left wrist      Precautions   Precautions  Other (comment)    Precaution Comments  No lifting over 3lbs.     Required Braces or Orthoses  Other Brace/Splint    Other Brace/Splint  Pre-fabricated resting hand brace. Wear full time unless bathing and completing HEP. Do not wean from splint until follow up with MD.                OT Treatments/Exercises  (OP) - 11/05/19 1539      Exercises   Exercises  Wrist;Hand;Elbow      Elbow Exercises   Forearm Supination  PROM;10 reps    Forearm Pronation  PROM;10 reps      Wrist Exercises   Wrist Flexion  PROM;10 reps    Wrist Extension  PROM;10 reps    Wrist Radial Deviation  PROM;10 reps    Wrist Ulnar Deviation  PROM;10 reps    Other wrist exercises  Wrist flexion and extension stretch using pink ball while standing at table top; 30" hold 2 sets each      Additional Wrist Exercises   Sponges  5,5      Hand Exercises   MCPJ Flexion  PROM;10 reps    MCPJ Extension  PROM;10 reps    PIPJ Flexion  PROM;10 reps    PIPJ Extension  PROM;10 reps    DIPJ Flexion  PROM;10 reps    DIPJ Extension  PROM;10 reps    Other Hand Exercises  washcloth crinkle 5X in/out      Manual Therapy   Manual Therapy  Myofascial release;Joint mobilization;Muscle Energy Technique  Manual therapy comments  completed seperately from all other interventions this date    Edema Management  Edema reducing completed to LUE from elbow to hand.    Joint Mobilization  Joint mobilization techniques completed to left forearm, wrist and hand.    Myofascial Release  myofascial release and manual stretching to decrease fascial restrictions and improve pain free mobility in left forearm, wrist, and hand      Fine Motor Coordination (Hand/Wrist)   Fine Motor Coordination  Grooved pegs;In hand manipuation training    In Hand Manipulation Training  Sponges used (both soft and resistive) to work on in Ec[employer]    Grooved pegs  Pt placed pegs in grooved pegboard one at a time focusing on coordination               [place] - 10/14/19 1357      OT [place] #1   Title  Patient will be educated and independent with HEP in order to faciliate progress in therapy and allow her to return to using her Left UE as her non dominant extremity for 75% or more of daily tasks.    Time  3    Period  Weeks     Status  On-going    Target Date  10/30/19      OT SHORT TERM GOAL #2   Title  Patient will increase P/ROM to [facility] in order to begin using her left hand as an active assist with basic tasks such as turn the lightswitch on and off.    Time  3    Period  Weeks    Status  On-going        OT Long Term Goals - 10/14/19 1049      OT LONG TERM GOAL #1   Title  Patient will increase Left wrist and hand A/ROM to [facility] in order to be able to stabilize containers and jars when attempting to open them.    Time  6    Period  Weeks    Status  On-going      OT LONG TERM GOAL #2   Title  Patient will increase her grip strength by 15# and her pinch strength by 6# in order to return to holding onto items without the needing to drop them.    Time  6    Period  Weeks    Status  On-going      OT LONG TERM GOAL #3   Title  Patient will increase her left hand coordination by completing the 9 hole peg test in 20 seconds or less.    Time  6    Period  Weeks    Status  On-going      OT LONG TERM GOAL #4   Title  Patient will increase her left wrist strength to 4/5 in order to pick up and carry lightweight household items with less items.    Time  6    Period  Weeks    Status  On-going      OT LONG TERM GOAL #5   Title  Patient will decrease fascial restrictions to min amount or less in order to increase the functional mobility needed to complete functional tasks such as grasping a doorknob to open.    Time  6    Period  Weeks    Status  On-going            Plan - 11/05/19 1646  Clinical Impression Statement  A: Patient was able to complete very minimal pinch strengthening using clothespins while also working on functional coordination of her pointer and thumb. VC for form and technique were provided. Continues to have increased joint restrictions and manual techniques were completed to address. Less edema noted this session versus yesterday. pt reports that she is actively wearing her smaller  edema glove and she believes it is helping.    Body Structure / Function / Physical Skills  ADL;UE functional use;Fascial restriction;Pain;FMC;ROM;GMC;Coordination;Decreased knowledge of precautions;Decreased knowledge of use of DME;Edema;Strength;IADL       Patient will benefit from skilled therapeutic intervention in order to improve the following deficits and impairments:   Body Structure / Function / Physical Skills: ADL, UE functional use, Fascial restriction, Pain, FMC, ROM, GMC, Coordination, Decreased knowledge of precautions, Decreased knowledge of use of DME, Edema, Strength, IADL       Visit Diagnosis: Other symptoms and signs involving the musculoskeletal system  Other lack of coordination  Stiffness of left wrist, not elsewhere classified  Pain in left wrist  Generalized edema    Problem List Patient Active Problem List   Diagnosis Date Noted  . Fracture of left wrist with routine healing 10/04/2019  . Fracture, [place], left, closed 09/09/2019  . S/P ORIF (open reduction internal fixation) fracture 09/05/2019 left wrist  09/09/2019  . Left wrist fracture   . Hypertension 09/02/2019   Ailene Ravel, OTR/L,CBIS  [phone]  11/05/2019, 4:48 PM  [facility] [address] [place], [place], [address] Phone: [phone]   Fax:  [phone]  Name: Victoria Dominguez MRN: [id number] Date of Birth: 03/03/1951

## 2019-11-07 ENCOUNTER — Encounter (HOSPITAL_COMMUNITY): Payer: Medicare Other | Admitting: Occupational Therapy

## 2019-11-11 ENCOUNTER — Ambulatory Visit (HOSPITAL_COMMUNITY): Payer: Medicare Other

## 2019-11-11 ENCOUNTER — Encounter (HOSPITAL_COMMUNITY): Payer: Self-pay

## 2019-11-11 ENCOUNTER — Other Ambulatory Visit: Payer: Self-pay

## 2019-11-11 DIAGNOSIS — R601 Generalized edema: Secondary | ICD-10-CM

## 2019-11-11 DIAGNOSIS — R29898 Other symptoms and signs involving the musculoskeletal system: Secondary | ICD-10-CM

## 2019-11-11 DIAGNOSIS — M25532 Pain in left wrist: Secondary | ICD-10-CM

## 2019-11-11 DIAGNOSIS — R278 Other lack of coordination: Secondary | ICD-10-CM

## 2019-11-11 DIAGNOSIS — M25632 Stiffness of left wrist, not elsewhere classified: Secondary | ICD-10-CM

## 2019-11-11 NOTE — Therapy (Signed)
[place], [place], [address] Phone: [phone]   Fax:  [phone]  Occupational Therapy Treatment  Patient Details  Name: Victoria Dominguez MRN: [id number] Date of Birth: [birth year] Referring Provider (OT): Arther Abbott, MD   Encounter Date: 11/11/2019  OT End of Session - 11/11/19 1101    Visit Number  11    Number of Visits  12    Date for OT Re-Evaluation  12/23/19    Authorization Type  medicare    Authorization Time Period  progress note at visit 50    Authorization - Visit Number  11    Authorization - Number of Visits  19    OT Start Time  1030    OT Stop Time  1110    OT Time Calculation (min)  40 min    Activity Tolerance  Patient tolerated treatment well    Behavior During Therapy  [facility] for tasks assessed/performed       Past Medical History:  Diagnosis Date  . Hypertension     Past Surgical History:  Procedure Laterality Date  . ABDOMINAL HYSTERECTOMY    . ORIF WRIST FRACTURE Left 09/05/2019   Procedure: OPEN REDUCTION INTERNAL FIXATION (ORIF) WRIST FRACTURE;  Surgeon: Carole Civil, MD;  Location: [facility];  Service: Orthopedics;  Laterality: Left;    There were no vitals filed for this visit.  Subjective Assessment - 11/11/19 1054    Subjective   S: I am able to wear my glove for 35-40 minutes 4 times a day.    Currently in Pain?  No/denies   only pain during passive stretching        [facility] OT Assessment - 11/11/19 1055      Assessment   Medical Diagnosis  S/P ORIF left wrist    Referring Provider (OT)  Arther Abbott, MD    Onset Date/Surgical Date  --    Hand Dominance  --    Next MD Visit  --    Prior Therapy  --      Precautions   Precautions  Other (comment)    Precaution Comments  No lifting over 3lbs.     Required Braces or Orthoses  Other Brace/Splint    Other Brace/Splint  Pre-fabricated resting hand brace. Wear full time unless bathing and completing HEP. Do  not wean from splint until follow up with MD.       Restrictions   Weight Bearing Restrictions  --      Home  Environment   Family/patient expects to be discharged to:  --         Measurements from session on 11/04/2019: Coordination     9 Hole Peg Test  Left     Left 9 Hole Peg Test  29.7"   previous: 26.6"         AROM    Overall AROM Comments  Measurements for digits were for extension.     AROM Assessment Site  Wrist     Left Forearm Pronation  90 Degrees   previous: 60    Left Forearm Supination  58 Degrees   previous: 50    Right/Left Wrist  Left     Left Wrist Extension  30 Degrees   previous: same    Left Wrist Flexion  44 Degrees   previous: 30    Left Wrist Radial Deviation  12 Degrees   previous: 10    Left Wrist Ulnar Deviation  26  Degrees   previous: same         PROM    PROM Assessment Site  Forearm;Wrist     Right/Left Wrist  Left     Left Wrist Extension  40 Degrees   previous: 24    Left Wrist Flexion  56 Degrees   previous: 44    Left Wrist Radial Deviation  40 Degrees   previous: 30    Left Wrist Ulnar Deviation  20 Degrees   previous: 18         Strength    Strength Assessment Site  Forearm;Wrist     Right/Left Forearm  Left     Left Forearm Pronation  3/5   previous: 3-/5    Left Forearm Supination  3-/5   previous: same    Right/Left Wrist  Left     Left Wrist Flexion  3-/5   previous: same    Left Wrist Extension  3-/5   previous: same    Left Wrist Radial Deviation  3-/5   previous: same    Left Wrist Ulnar Deviation  3-/5   previous: same    Left Hand Grip (lbs)  7   previous: same    Left Hand Lateral Pinch  7 lbs   previous: 6    Left Hand 3 Point Pinch  5 lbs   previous: 3         Left Hand AROM    L Index  MCP 0-90  20 Degrees   previous: 30    L Index PIP 0-100  8 Degrees   previous: 28    L Long  MCP 0-90  18 Degrees   previous: 32    L Long PIP 0-100  10 Degrees   previous: 34    L Ring  MCP 0-90  30 Degrees    previous: 36    L Ring PIP 0-100  28 Degrees   previous: 48    L Little  MCP 0-90  0 Degrees   previous: 30    L Little PIP 0-100  10 Degrees   previous: 16        A/ROM note assessed prior to this date.    Left Hand AROM- Flexion  L Thumb MCP 0-60  42 Degrees   L Thumb IP 0-80  46 Degrees        L Index  MCP 0-90  56 Degrees   L Index PIP 0-100  56 Degrees   L Index DIP 0-70  58 Degrees   L Long  MCP 0-90  66 Degrees   L Long PIP 0-100  64 Degrees   L Long DIP 0-70  46 Degrees   L Ring  MCP 0-90  58 Degrees   L Ring PIP 0-100  68 Degrees   L Ring DIP 0-70  48 Degrees   L Little  MCP 0-90  36 Degrees   L Little PIP 0-100  74 Degrees   L Little DIP 0-70  46 Degrees          OT Treatments/Exercises (OP) - 11/11/19 1056      Exercises   Exercises  Wrist;Hand;Elbow      Wrist Exercises   Wrist Flexion  PROM;10 reps    Wrist Extension  PROM;10 reps    Wrist Radial Deviation  PROM;10 reps    Wrist Ulnar Deviation  PROM;10 reps    Other wrist exercises  Wrist flexion and extension stretch using pink ball while  standing at table top; 30" hold 2 sets each    Other wrist exercises  Composite finger extension stretch; standing using the table edge; 2x30"      Additional Wrist Exercises   Sponges  5      Hand Exercises   MCPJ Flexion  PROM;10 reps    MCPJ Extension  PROM;10 reps    PIPJ Flexion  PROM;10 reps    PIPJ Extension  PROM;10 reps    DIPJ Flexion  PROM;10 reps    DIPJ Extension  PROM;10 reps    Other Hand Exercises  Utilized red resistive clothespin and 3 point pinch to pick up 25 sponges and transfer to box top.     Other Hand Exercises  red resistive clothespin used to stack 3 sponges for a total  of 4 stacks. 3 point pinch utilized.       Manual Therapy   Manual Therapy  Joint mobilization;Myofascial release    Manual therapy comments  completed seperately from all other interventions this date    Edema Management  Edema reducing completed to LUE from  elbow to hand.    Joint Mobilization  Joint mobilization techniques completed to left forearm, wrist and hand.      Fine Motor Coordination (Hand/Wrist)   Fine Motor Coordination  In hand manipuation training;Stacking coins    In Hand Manipulation Training  Pt transferred a sponge cube from fingertip to palm and back 10X    Stacking coins  Picking up one coin at a time, using a 2 point pinch, patient stacked them in a tower of 5 coins each.               OT Short Term Goals - 10/14/19 1357      OT SHORT TERM GOAL #1   Title  Patient will be educated and independent with HEP in order to faciliate progress in therapy and allow her to return to using her Left UE as her non dominant extremity for 75% or more of daily tasks.    Time  3    Period  Weeks    Status  On-going    Target Date  10/30/19      OT SHORT TERM GOAL #2   Title  Patient will increase P/ROM to [facility] in order to begin using her left hand as an active assist with basic tasks such as turn the lightswitch on and off.    Time  3    Period  Weeks    Status  On-going        OT Long Term Goals - 10/14/19 1049      OT LONG TERM GOAL #1   Title  Patient will increase Left wrist and hand A/ROM to [facility] in order to be able to stabilize containers and jars when attempting to open them.    Time  6    Period  Weeks    Status  On-going      OT LONG TERM GOAL #2   Title  Patient will increase her grip strength by 15# and her pinch strength by 6# in order to return to holding onto items without the needing to drop them.    Time  6    Period  Weeks    Status  On-going      OT LONG TERM GOAL #3   Title  Patient will increase her left hand coordination by completing the 9 hole peg test in 20 seconds or less.  Time  6    Period  Weeks    Status  On-going      OT LONG TERM GOAL #4   Title  Patient will increase her left wrist strength to 4/5 in order to pick up and carry lightweight household items with less items.     Time  6    Period  Weeks    Status  On-going      OT LONG TERM GOAL #5   Title  Patient will decrease fascial restrictions to min amount or less in order to increase the functional mobility needed to complete functional tasks such as grasping a doorknob to open.    Time  6    Period  Weeks    Status  On-going            Plan - 11/11/19 1226    Clinical Impression Statement  A: Re-cert completed this date. Patient has made improvement with passive and A/ROM measurements for her left wrist and hand. Coordination and hand strength has made minimal improvement since we have been focusing primarily on ROM prior to strengthening. Patient has recently received a flexion glove to continue working on increase hand ROM and requested her rubberbands be tightened at this session. Focus during sessions have been to decrease edema and increase ROM prior to addressing coordination and strength. We have begun to add corrdination and pinch strengthening into sessions. Patient reports that she is tolerating the flexion glove and increasing the amount of times she wears it during the day. Pt will benefit from continued OT services to address mentioned deficits.    Body Structure / Function / Physical Skills  ADL;UE functional use;Fascial restriction;Pain;FMC;ROM;GMC;Coordination;Decreased knowledge of precautions;Decreased knowledge of use of DME;Edema;Strength;IADL    OT Frequency  2x / week    OT Duration  6 weeks    Plan  P: Continue with use of manual techniques to decrease edema and increase joint mobility. Use massage cream to decrease friction. Next session: use yellow theraputty: complete roll, grip, pinch, and locate beads.    Consulted and Agree with Plan of Care  Patient       Patient will benefit from skilled therapeutic intervention in order to improve the following deficits and impairments:   Body Structure / Function / Physical Skills: ADL, UE functional use, Fascial restriction, Pain, FMC,  ROM, GMC, Coordination, Decreased knowledge of precautions, Decreased knowledge of use of DME, Edema, Strength, IADL       Visit Diagnosis: Other symptoms and signs involving the musculoskeletal system - Plan: Ot plan of care cert/re-cert  Other lack of coordination - Plan: Ot plan of care cert/re-cert  Stiffness of left wrist, not elsewhere classified - Plan: Ot plan of care cert/re-cert  Generalized edema - Plan: Ot plan of care cert/re-cert  Pain in left wrist - Plan: Ot plan of care cert/re-cert    Problem List Patient Active Problem List   Diagnosis Date Noted  . Fracture of left wrist with routine healing 10/04/2019  . Fracture, [place], left, closed 09/09/2019  . S/P ORIF (open reduction internal fixation) fracture 09/05/2019 left wrist  09/09/2019  . Left wrist fracture   . Hypertension 09/02/2019   Ailene Ravel, OTR/L,CBIS  [phone]  11/11/2019, 12:36 PM  [place] [address] [place], [place], [address] Phone: [phone]   Fax:  [phone]  Name: Aliana Rawles MRN: [id number] Date of Birth: 11-06-1951

## 2019-11-12 ENCOUNTER — Ambulatory Visit (HOSPITAL_COMMUNITY): Payer: Medicare Other | Admitting: Occupational Therapy

## 2019-11-12 ENCOUNTER — Encounter (HOSPITAL_COMMUNITY): Payer: Self-pay | Admitting: Occupational Therapy

## 2019-11-12 DIAGNOSIS — R601 Generalized edema: Secondary | ICD-10-CM | POA: Diagnosis not present

## 2019-11-12 DIAGNOSIS — R278 Other lack of coordination: Secondary | ICD-10-CM

## 2019-11-12 DIAGNOSIS — M25532 Pain in left wrist: Secondary | ICD-10-CM

## 2019-11-12 DIAGNOSIS — R29898 Other symptoms and signs involving the musculoskeletal system: Secondary | ICD-10-CM

## 2019-11-12 DIAGNOSIS — M25632 Stiffness of left wrist, not elsewhere classified: Secondary | ICD-10-CM

## 2019-11-12 NOTE — Therapy (Signed)
[place], [place], [address] Phone: [phone]   Fax:  [phone]  Occupational Therapy Treatment  Patient Details  Name: Victoria Dominguez MRN: [id number] Date of Birth: 1951-08-15 Referring Provider (OT): Arther Abbott, MD   Encounter Date: 11/12/2019  OT End of Session - 11/12/19 1111    Visit Number  12    Number of Visits  24    Date for OT Re-Evaluation  12/23/19    Authorization Type  medicare    Authorization Time Period  progress note at visit 33    Authorization - Visit Number  12    Authorization - Number of Visits  19    OT Start Time  1034    OT Stop Time  1115    OT Time Calculation (min)  41 min    Activity Tolerance  Patient tolerated treatment well    Behavior During Therapy  [facility] for tasks assessed/performed       Past Medical History:  Diagnosis Date  . Hypertension     Past Surgical History:  Procedure Laterality Date  . ABDOMINAL HYSTERECTOMY    . ORIF WRIST FRACTURE Left 09/05/2019   Procedure: OPEN REDUCTION INTERNAL FIXATION (ORIF) WRIST FRACTURE;  Surgeon: Carole Civil, MD;  Location: [facility];  Service: Orthopedics;  Laterality: Left;    There were no vitals filed for this visit.  Subjective Assessment - 11/12/19 1029    Subjective   S: I've been using it more.    Currently in Pain?  No/denies         [facility] OT Assessment - 11/12/19 1029      Assessment   Medical Diagnosis  S/P ORIF left wrist    Referring Provider (OT)  Arther Abbott, MD      Precautions   Precautions  Other (comment)    Precaution Comments  No lifting over 3lbs.     Required Braces or Orthoses  Other Brace/Splint    Other Brace/Splint  Pre-fabricated resting hand brace. Wear full time unless bathing and completing HEP. Do not wean from splint until follow up with MD.                OT Treatments/Exercises (OP) - 11/12/19 1035      Exercises   Exercises   Wrist;Hand;Elbow;Theraputty      Additional Elbow Exercises   Theraputty - Roll  yellow    Theraputty - Grip  yellow-pronated and supinated      Wrist Exercises   Wrist Flexion  PROM;10 reps    Wrist Extension  PROM;10 reps    Wrist Radial Deviation  PROM;10 reps    Wrist Ulnar Deviation  PROM;10 reps    Other wrist exercises  Wrist flexion and extension stretch using pink ball while standing at table top; 30" hold 2 sets each    Other wrist exercises  Composite finger extension stretch; standing using the table edge; 2x30"      Additional Wrist Exercises   Sponges  4, 4, 5      Hand Exercises   MCPJ Flexion  PROM;10 reps    MCPJ Extension  PROM;10 reps    PIPJ Flexion  PROM;10 reps    PIPJ Extension  PROM;10 reps    DIPJ Flexion  PROM;10 reps    DIPJ Extension  PROM;10 reps      Theraputty   Theraputty - Pinch  yellow-lateral and 3 point    Theraputty Hand- Locate Pegs  yellow-5 beads      Manual Therapy   Manual Therapy  Joint mobilization;Myofascial release    Manual therapy comments  completed seperately from all other interventions this date    Edema Management  Edema reducing completed to LUE from elbow to hand.    Joint Mobilization  Joint mobilization techniques completed to left forearm, wrist and hand.               OT Short Term Goals - 10/14/19 1357      OT SHORT TERM GOAL #1   Title  Patient will be educated and independent with HEP in order to faciliate progress in therapy and allow her to return to using her Left UE as her non dominant extremity for 75% or more of daily tasks.    Time  3    Period  Weeks    Status  On-going    Target Date  10/30/19      OT SHORT TERM GOAL #2   Title  Patient will increase P/ROM to [facility] in order to begin using her left hand as an active assist with basic tasks such as turn the lightswitch on and off.    Time  3    Period  Weeks    Status  On-going        OT Long Term Goals - 10/14/19 1049      OT LONG TERM  GOAL #1   Title  Patient will increase Left wrist and hand A/ROM to [facility] in order to be able to stabilize containers and jars when attempting to open them.    Time  6    Period  Weeks    Status  On-going      OT LONG TERM GOAL #2   Title  Patient will increase her grip strength by 15# and her pinch strength by 6# in order to return to holding onto items without the needing to drop them.    Time  6    Period  Weeks    Status  On-going      OT LONG TERM GOAL #3   Title  Patient will increase her left hand coordination by completing the 9 hole peg test in 20 seconds or less.    Time  6    Period  Weeks    Status  On-going      OT LONG TERM GOAL #4   Title  Patient will increase her left wrist strength to 4/5 in order to pick up and carry lightweight household items with less items.    Time  6    Period  Weeks    Status  On-going      OT LONG TERM GOAL #5   Title  Patient will decrease fascial restrictions to min amount or less in order to increase the functional mobility needed to complete functional tasks such as grasping a doorknob to open.    Time  6    Period  Weeks    Status  On-going            Plan - 11/12/19 1107    Clinical Impression Statement  A: Pt reports she is trying to use her hand and wrist more during the day now and was able to wear her flexion glove for 1 hour last night. Continued with edema management prior to passive stretching. Continued with wrist flexion and extension stretches and in-hand manipulation with sponges. Added yellow theraputty tasks this session, max difficulty with  lateral pinch. Verbal cuing for form and technique.    Body Structure / Function / Physical Skills  ADL;UE functional use;Fascial restriction;Pain;FMC;ROM;GMC;Coordination;Decreased knowledge of precautions;Decreased knowledge of use of DME;Edema;Strength;IADL    Plan  P: Continue with edema management techniques and passive stretching, pinch task with clothespin       Patient  will benefit from skilled therapeutic intervention in order to improve the following deficits and impairments:   Body Structure / Function / Physical Skills: ADL, UE functional use, Fascial restriction, Pain, FMC, ROM, GMC, Coordination, Decreased knowledge of precautions, Decreased knowledge of use of DME, Edema, Strength, IADL       Visit Diagnosis: Other symptoms and signs involving the musculoskeletal system  Other lack of coordination  Stiffness of left wrist, not elsewhere classified  Generalized edema  Pain in left wrist    Problem List Patient Active Problem List   Diagnosis Date Noted  . Fracture of left wrist with routine healing 10/04/2019  . Fracture, [place], left, closed 09/09/2019  . S/P ORIF (open reduction internal fixation) fracture 09/05/2019 left wrist  09/09/2019  . Left wrist fracture   . Hypertension 09/02/2019   Guadelupe Sabin, OTR/L  [phone] 11/12/2019, 11:19 AM  [place] [address] [place], [place], [address] Phone: [phone]   Fax:  [phone]  Name: Victoria Dominguez MRN: [id number] Date of Birth: December 20, 1950

## 2019-11-18 ENCOUNTER — Encounter: Payer: Self-pay | Admitting: Orthopedic Surgery

## 2019-11-18 ENCOUNTER — Ambulatory Visit (HOSPITAL_COMMUNITY): Payer: Medicare Other | Admitting: Occupational Therapy

## 2019-11-18 ENCOUNTER — Other Ambulatory Visit: Payer: Self-pay

## 2019-11-18 ENCOUNTER — Encounter (HOSPITAL_COMMUNITY): Payer: Self-pay | Admitting: Occupational Therapy

## 2019-11-18 ENCOUNTER — Ambulatory Visit (INDEPENDENT_AMBULATORY_CARE_PROVIDER_SITE_OTHER): Payer: Medicare Other | Admitting: Orthopedic Surgery

## 2019-11-18 DIAGNOSIS — R29898 Other symptoms and signs involving the musculoskeletal system: Secondary | ICD-10-CM

## 2019-11-18 DIAGNOSIS — R601 Generalized edema: Secondary | ICD-10-CM | POA: Diagnosis not present

## 2019-11-18 DIAGNOSIS — Z8781 Personal history of (healed) traumatic fracture: Secondary | ICD-10-CM

## 2019-11-18 DIAGNOSIS — Z9889 Other specified postprocedural states: Secondary | ICD-10-CM

## 2019-11-18 DIAGNOSIS — R278 Other lack of coordination: Secondary | ICD-10-CM

## 2019-11-18 DIAGNOSIS — M25632 Stiffness of left wrist, not elsewhere classified: Secondary | ICD-10-CM

## 2019-11-18 DIAGNOSIS — M25532 Pain in left wrist: Secondary | ICD-10-CM

## 2019-11-18 NOTE — Progress Notes (Signed)
POST OP   Chief Complaint  Patient presents with  . Routine Post Op    09/05/2019 SURGERY ORIF LEFT WRIST     Approximately 9 weeks after ORIF left wrist patient developed severe contractures in the hand may have even developed some early RSD  She is in therapy and says she is getting better she can now at least pick up a laundry basket.  Again we note the notes from PT below  Some of the shininess and swelling in the hand have gone down her PIP joints and DIP joints are still stiff  MP joint slightly improved   Plan - 11/12/19 1107     Clinical Impression Statement  A: Pt reports she is trying to use her hand and wrist more during the day now and was able to wear her flexion glove for 1 hour last night. Continued with edema management prior to passive stretching. Continued with wrist flexion and extension stretches and in-hand manipulation with sponges. Added yellow theraputty tasks this session, max difficulty with lateral pinch. Verbal cuing for form and technique.     Body Structure / Function / Physical Skills  ADL;UE functional use;Fascial restriction;Pain;FMC;ROM;GMC;Coordination;Decreased knowledge of precautions;Decreased knowledge of use of DME;Edema;Strength;IADL     Plan  P: Continue with edema management techniques and passive stretching, pinch task with clothespin    Plan - 10/15/19 1736     Clinical Impression Statement  A: Pt reports increased soreness after yesterday's session. Debridement of dry skin at scar completed at beginning of session, minimal skin removed as skin was peeling however was deeply attached. Continued with manual techniques and passive stretching, improvement in mobility in wrist and digits noted with increased repetitions. Verbal cuing for form and technique with A/ROM exercises.     Body Structure / Function / Physical Skills  ADL;UE functional use;Fascial restriction;Pain;FMC;ROM;GMC;Coordination;Decreased knowledge of precautions;Decreased knowledge  of use of DME;Edema;Strength;IADL     Plan  P: Follow up on MD appt, continue with tendon glides and add to HEP, continue working ot improve mobility required for functional tasks in wrist and digits. Attempt putty tasks    Reorder OT  Follow-up 6 to 8 weeks

## 2019-11-18 NOTE — Therapy (Signed)
[place], [place], [address] Phone: [phone]   Fax:  [phone]  Occupational Therapy Treatment  Patient Details  Name: Victoria Dominguez MRN: [id number] Date of Birth: 30-Apr-1951 Referring Provider (OT): Arther Abbott, MD   Encounter Date: 11/18/2019  OT End of Session - 11/18/19 1427    Visit Number  13    Number of Visits  24    Date for OT Re-Evaluation  12/23/19    Authorization Type  medicare    Authorization Time Period  progress note at visit 38    Authorization - Visit Number  13    Authorization - Number of Visits  19    OT Start Time  [id number]    OT Stop Time  1428    OT Time Calculation (min)  40 min    Activity Tolerance  Patient tolerated treatment well    Behavior During Therapy  [facility] for tasks assessed/performed       Past Medical History:  Diagnosis Date  . Hypertension     Past Surgical History:  Procedure Laterality Date  . ABDOMINAL HYSTERECTOMY    . ORIF WRIST FRACTURE Left 09/05/2019   Procedure: OPEN REDUCTION INTERNAL FIXATION (ORIF) WRIST FRACTURE;  Surgeon: Carole Civil, MD;  Location: [facility];  Service: Orthopedics;  Laterality: Left;    There were no vitals filed for this visit.  Subjective Assessment - 11/18/19 1349    Subjective   S: I picked up a laundry basket today.    Currently in Pain?  Yes    Pain Score  1     Pain Location  Wrist    Pain Orientation  Left    Pain Descriptors / Indicators  Tender    Pain Type  Acute pain    Pain Radiating Towards  n/a    Pain Onset  Yesterday    Pain Frequency  Intermittent    Aggravating Factors   exercises    Pain Relieving Factors  pain medication    Effect of Pain on Daily Activities  max effect on ADLs    Multiple Pain Sites  No         [facility] OT Assessment - 11/18/19 1346      Assessment   Medical Diagnosis  S/P ORIF left wrist    Referring Provider (OT)  Arther Abbott, MD      Precautions   Precautions   Other (comment)    Precaution Comments  No lifting over 3lbs.     Required Braces or Orthoses  --    Other Brace/Splint  --               OT Treatments/Exercises (OP) - 11/18/19 1350      Exercises   Exercises  Wrist;Hand;Elbow;Theraputty      Additional Elbow Exercises   Theraputty - Roll  yellow    Theraputty - Grip  yellow-pronated and supinated      Wrist Exercises   Wrist Flexion  PROM;10 reps    Wrist Extension  PROM;10 reps    Wrist Radial Deviation  PROM;10 reps    Wrist Ulnar Deviation  PROM;10 reps    Other wrist exercises  Wrist flexion and extension stretch using pink ball while standing at table top; 30" hold 2 sets each    Other wrist exercises  Composite finger extension stretch; standing using the table edge; 2x30"      Hand Exercises   MCPJ Flexion  PROM;10  reps    MCPJ Extension  PROM;10 reps    PIPJ Flexion  PROM;10 reps    PIPJ Extension  PROM;10 reps    DIPJ Flexion  PROM;10 reps    DIPJ Extension  PROM;10 reps    Joint Blocking Exercises  10X, A/ROM PIP and DIP joints    Digit Composite ABduction  AROM;10 reps    Digit Composite ADduction  AROM;10 reps    Other Hand Exercises  towel crunch, 10X    Other Hand Exercises  finger taps, 10X, A/ROM      Theraputty   Theraputty - Pinch  yellow-lateral and 3 point      Manual Therapy   Manual Therapy  Joint mobilization;Myofascial release    Manual therapy comments  completed seperately from all other interventions this date    Edema Management  Edema reducing completed to LUE from elbow to hand.    Joint Mobilization  Joint mobilization techniques completed to left forearm, wrist and hand.             OT Education - 11/18/19 1419    Education Details  finger A/ROM, educated on DIP/PIP joint flexion stretch    Person(s) Educated  Patient    Methods  Explanation;Demonstration;Handout    Comprehension  Verbalized understanding;Returned demonstration       OT Short Term Goals -  10/14/19 1357      OT SHORT TERM GOAL #1   Title  Patient will be educated and independent with HEP in order to faciliate progress in therapy and allow her to return to using her Left UE as her non dominant extremity for 75% or more of daily tasks.    Time  3    Period  Weeks    Status  On-going    Target Date  10/30/19      OT SHORT TERM GOAL #2   Title  Patient will increase P/ROM to [facility] in order to begin using her left hand as an active assist with basic tasks such as turn the lightswitch on and off.    Time  3    Period  Weeks    Status  On-going        OT Long Term Goals - 10/14/19 1049      OT LONG TERM GOAL #1   Title  Patient will increase Left wrist and hand A/ROM to [facility] in order to be able to stabilize containers and jars when attempting to open them.    Time  6    Period  Weeks    Status  On-going      OT LONG TERM GOAL #2   Title  Patient will increase her grip strength by 15# and her pinch strength by 6# in order to return to holding onto items without the needing to drop them.    Time  6    Period  Weeks    Status  On-going      OT LONG TERM GOAL #3   Title  Patient will increase her left hand coordination by completing the 9 hole peg test in 20 seconds or less.    Time  6    Period  Weeks    Status  On-going      OT LONG TERM GOAL #4   Title  Patient will increase her left wrist strength to 4/5 in order to pick up and carry lightweight household items with less items.    Time  6  Period  Weeks    Status  On-going      OT LONG TERM GOAL #5   Title  Patient will decrease fascial restrictions to min amount or less in order to increase the functional mobility needed to complete functional tasks such as grasping a doorknob to open.    Time  6    Period  Weeks    Status  On-going            Plan - 11/18/19 1420    Clinical Impression Statement  A: Pt reports MD appt went well, released from brace will follow up on 7 weeks. Pt reports she has been  using her wrist more this weekend and is mildly sore from increased use. Added joint blocking for DIP and PIP joints today, also completed towel crunch, finger taps, and digit ab/adduction. Continued working on digit extension stretching and theraputty tasks. Verbal cuing for form and technique.    Body Structure / Function / Physical Skills  ADL;UE functional use;Fascial restriction;Pain;FMC;ROM;GMC;Coordination;Decreased knowledge of precautions;Decreased knowledge of use of DME;Edema;Strength;IADL    Plan  P: Continue with passive stretching and edema managment techniques, add weighted wrist flexion and extension stretches       Patient will benefit from skilled therapeutic intervention in order to improve the following deficits and impairments:   Body Structure / Function / Physical Skills: ADL, UE functional use, Fascial restriction, Pain, FMC, ROM, GMC, Coordination, Decreased knowledge of precautions, Decreased knowledge of use of DME, Edema, Strength, IADL       Visit Diagnosis: Other symptoms and signs involving the musculoskeletal system  Other lack of coordination  Stiffness of left wrist, not elsewhere classified  Generalized edema  Pain in left wrist    Problem List Patient Active Problem List   Diagnosis Date Noted  . Fracture of left wrist with routine healing 10/04/2019  . Fracture, [place], left, closed 09/09/2019  . S/P ORIF (open reduction internal fixation) fracture 09/05/2019 left wrist  09/09/2019  . Left wrist fracture   . Hypertension 09/02/2019   Guadelupe Sabin, OTR/L  [phone] 11/18/2019, 2:28 PM  [place] [address] [place], [place], [address] Phone: [phone]   Fax:  [phone]  Name: Victoria Dominguez MRN: [id number] Date of Birth: 02/01/1951

## 2019-11-18 NOTE — Patient Instructions (Signed)
1) Towel crunch Place a small towel on a firm table top. Flatten out the towel and then place your hand on one end of it.  Next, flex your fingers 2-5 (index finger through pinky finger) as you pull the towel towards your hand.    2) Digit composite flexion/adduction (make a fist) Hold your hand up as shown. Open and close your hand into a fist and repeat. If you cannot make a full fist, then make a partial fist.     3) Finger Taps Start with the hand flat and fingers slightly spread.  One at a time, starting with the thumb, lift each finger up separately.    4) PIP Joint Blocking Grasp the affected finger, bracing below the middle knuckle, and actively bend the finger as shown.    5) DIP Joint Blocking Grasp the affected finger, bracing below the last knuckle, and actively bend the finger at the last joint.     6) Digit Abduction/Adduction Hold hand palm down flat on table. Spread your fingers apart and back together.

## 2019-11-20 ENCOUNTER — Other Ambulatory Visit: Payer: Self-pay

## 2019-11-20 ENCOUNTER — Ambulatory Visit (HOSPITAL_COMMUNITY): Payer: Medicare Other

## 2019-11-20 ENCOUNTER — Encounter (HOSPITAL_COMMUNITY): Payer: Self-pay

## 2019-11-20 DIAGNOSIS — M25532 Pain in left wrist: Secondary | ICD-10-CM

## 2019-11-20 DIAGNOSIS — R601 Generalized edema: Secondary | ICD-10-CM

## 2019-11-20 DIAGNOSIS — R29898 Other symptoms and signs involving the musculoskeletal system: Secondary | ICD-10-CM

## 2019-11-20 DIAGNOSIS — M25632 Stiffness of left wrist, not elsewhere classified: Secondary | ICD-10-CM

## 2019-11-20 DIAGNOSIS — R278 Other lack of coordination: Secondary | ICD-10-CM

## 2019-11-20 NOTE — Therapy (Signed)
[place], [place], [address] Phone: [phone]   Fax:  [phone]  Occupational Therapy Treatment  Patient Details  Name: Victoria Dominguez MRN: [id number] Date of Birth: 08/27/1951 Referring Provider (OT): Arther Abbott, MD   Encounter Date: 11/20/2019  OT End of Session - 11/20/19 1336    Visit Number  14    Number of Visits  24    Date for OT Re-Evaluation  12/23/19    Authorization Type  medicare    Authorization Time Period  progress note at visit 31    Authorization - Visit Number  14    Authorization - Number of Visits  19    OT Start Time  1300    OT Stop Time  1340    OT Time Calculation (min)  40 min    Activity Tolerance  Patient tolerated treatment well    Behavior During Therapy  [facility] for tasks assessed/performed       Past Medical History:  Diagnosis Date  . Hypertension     Past Surgical History:  Procedure Laterality Date  . ABDOMINAL HYSTERECTOMY    . ORIF WRIST FRACTURE Left 09/05/2019   Procedure: OPEN REDUCTION INTERNAL FIXATION (ORIF) WRIST FRACTURE;  Surgeon: Carole Civil, MD;  Location: [facility];  Service: Orthopedics;  Laterality: Left;    There were no vitals filed for this visit.  Subjective Assessment - 11/20/19 1321    Subjective   S: I picked up the laundry basket the other day.    Currently in Pain?  No/denies         [facility] OT Assessment - 11/20/19 1322      Assessment   Medical Diagnosis  S/P ORIF left wrist      Precautions   Precautions  Other (comment)    Precaution Comments  No lifting over 5lbs               OT Treatments/Exercises (OP) - 11/20/19 1322      Exercises   Exercises  Wrist;Hand;Elbow;Theraputty      Elbow Exercises   Forearm Supination  Strengthening;10 reps    Bar Weights/Barbell (Forearm Supination)  2 lbs    Forearm Pronation  Strengthening;10 reps    Bar Weights/Barbell (Forearm Pronation)  2 lbs      Weighted  Stretch Over Towel Roll   Supination - Weighted Stretch  2 pounds;60 seconds   hand weight used   Pronation - Weighted Stretch  2 pounds;60 seconds   hand weight used   Wrist Flexion - Weighted Stretch  1 pound;2 pounds;60 seconds   wrist weight used.    Wrist Extension - Weighted Stretch  1 pound;2 pounds;60 seconds   wrist weights used.     Wrist Exercises   Wrist Flexion  PROM;Strengthening;10 reps    Bar Weights/Barbell (Wrist Flexion)  2 lbs    Wrist Extension  PROM;Strengthening;10 reps    Bar Weights/Barbell (Wrist Extension)  2 lbs    Wrist Radial Deviation  Strengthening;10 reps    Bar Weights/Barbell (Radial Deviation)  2 lbs    Wrist Ulnar Deviation  Strengthening;10 reps    Bar Weights/Barbell (Ulnar Deviation)  2 lbs      Hand Exercises   MCPJ Flexion  PROM;10 reps    MCPJ Extension  PROM;10 reps    PIPJ Flexion  PROM;10 reps    PIPJ Extension  PROM;10 reps    DIPJ Flexion  PROM;10 reps  DIPJ Extension  PROM;10 reps    Other Hand Exercises  finger taps, 5X, A/ROM      Manual Therapy   Manual Therapy  Joint mobilization;Myofascial release    Manual therapy comments  completed seperately from all other interventions this date    Edema Management  Edema reducing completed to LUE from elbow to hand.    Joint Mobilization  Joint mobilization techniques completed to left forearm, wrist and hand.               OT Short Term Goals - 10/14/19 1357      OT SHORT TERM GOAL #1   Title  Patient will be educated and independent with HEP in order to faciliate progress in therapy and allow her to return to using her Left UE as her non dominant extremity for 75% or more of daily tasks.    Time  3    Period  Weeks    Status  On-going    Target Date  10/30/19      OT SHORT TERM GOAL #2   Title  Patient will increase P/ROM to [facility] in order to begin using her left hand as an active assist with basic tasks such as turn the lightswitch on and off.    Time  3    Period   Weeks    Status  On-going        OT Long Term Goals - 10/14/19 1049      OT LONG TERM GOAL #1   Title  Patient will increase Left wrist and hand A/ROM to [facility] in order to be able to stabilize containers and jars when attempting to open them.    Time  6    Period  Weeks    Status  On-going      OT LONG TERM GOAL #2   Title  Patient will increase her grip strength by 15# and her pinch strength by 6# in order to return to holding onto items without the needing to drop them.    Time  6    Period  Weeks    Status  On-going      OT LONG TERM GOAL #3   Title  Patient will increase her left hand coordination by completing the 9 hole peg test in 20 seconds or less.    Time  6    Period  Weeks    Status  On-going      OT LONG TERM GOAL #4   Title  Patient will increase her left wrist strength to 4/5 in order to pick up and carry lightweight household items with less items.    Time  6    Period  Weeks    Status  On-going      OT LONG TERM GOAL #5   Title  Patient will decrease fascial restrictions to min amount or less in order to increase the functional mobility needed to complete functional tasks such as grasping a doorknob to open.    Time  6    Period  Weeks    Status  On-going            Plan - 11/20/19 1337    Clinical Impression Statement  A: Added weighted wrist and forearm stretch while progressing to strengthening using a 2# hand weight. patient continues to demonstrate improvement with functional use although is having difficulty with ROM and strength. VC for form and technique as needed. Manual telchniques completed to address edema  and joint limitations.    Body Structure / Function / Physical Skills  ADL;UE functional use;Fascial restriction;Pain;FMC;ROM;GMC;Coordination;Decreased knowledge of precautions;Decreased knowledge of use of DME;Edema;Strength;IADL    Plan  P: Continue with wrist and forearm strengthening and stretching. Continue to work on functional use  and increasing ROM as able in order to use Left hand for basic daily tasks.    Consulted and Agree with Plan of Care  Patient       Patient will benefit from skilled therapeutic intervention in order to improve the following deficits and impairments:   Body Structure / Function / Physical Skills: ADL, UE functional use, Fascial restriction, Pain, FMC, ROM, GMC, Coordination, Decreased knowledge of precautions, Decreased knowledge of use of DME, Edema, Strength, IADL       Visit Diagnosis: Stiffness of left wrist, not elsewhere classified  Generalized edema  Pain in left wrist  Other lack of coordination  Other symptoms and signs involving the musculoskeletal system    Problem List Patient Active Problem List   Diagnosis Date Noted  . Fracture of left wrist with routine healing 10/04/2019  . Fracture, [place], left, closed 09/09/2019  . S/P ORIF (open reduction internal fixation) fracture 09/05/2019 left wrist  09/09/2019  . Left wrist fracture   . Hypertension 09/02/2019   Ailene Ravel, OTR/L,CBIS  [phone]  11/20/2019, 1:45 PM  [place] [address] [place], [place], [address] Phone: [phone]   Fax:  [phone]  Name: Adelita Myre MRN: [id number] Date of Birth: 15-Mar-1951

## 2019-11-27 ENCOUNTER — Encounter (HOSPITAL_COMMUNITY): Payer: Self-pay

## 2019-11-27 ENCOUNTER — Ambulatory Visit (HOSPITAL_COMMUNITY): Payer: Medicare Other | Attending: Orthopedic Surgery

## 2019-11-27 ENCOUNTER — Other Ambulatory Visit: Payer: Self-pay

## 2019-11-27 DIAGNOSIS — R601 Generalized edema: Secondary | ICD-10-CM | POA: Diagnosis present

## 2019-11-27 DIAGNOSIS — M25532 Pain in left wrist: Secondary | ICD-10-CM | POA: Diagnosis present

## 2019-11-27 DIAGNOSIS — R29898 Other symptoms and signs involving the musculoskeletal system: Secondary | ICD-10-CM | POA: Insufficient documentation

## 2019-11-27 DIAGNOSIS — R278 Other lack of coordination: Secondary | ICD-10-CM | POA: Diagnosis present

## 2019-11-27 DIAGNOSIS — M25632 Stiffness of left wrist, not elsewhere classified: Secondary | ICD-10-CM | POA: Diagnosis present

## 2019-11-27 NOTE — Therapy (Signed)
[place], [place], [address] Phone: [phone]   Fax:  [phone]  Occupational Therapy Treatment  Patient Details  Name: Victoria Dominguez MRN: [id number] Date of Birth: 1951-11-19 Referring Provider (OT): Arther Abbott, MD   Encounter Date: 11/27/2019  OT End of Session - 11/27/19 1254    Visit Number  15    Number of Visits  24    Date for OT Re-Evaluation  12/23/19    Authorization Type  medicare    Authorization Time Period  progress note at visit 66    Authorization - Visit Number  15    Authorization - Number of Visits  19    OT Start Time  0900    OT Stop Time  0938    OT Time Calculation (min)  38 min    Activity Tolerance  Patient tolerated treatment well    Behavior During Therapy  [facility] for tasks assessed/performed       Past Medical History:  Diagnosis Date  . Hypertension     Past Surgical History:  Procedure Laterality Date  . ABDOMINAL HYSTERECTOMY    . ORIF WRIST FRACTURE Left 09/05/2019   Procedure: OPEN REDUCTION INTERNAL FIXATION (ORIF) WRIST FRACTURE;  Surgeon: Carole Civil, MD;  Location: [facility];  Service: Orthopedics;  Laterality: Left;    There were no vitals filed for this visit.  Subjective Assessment - 11/27/19 1252    Subjective   S: I picked up a 5lb weight with this hand yesterday.    Currently in Pain?  No/denies         [facility] OT Assessment - 11/27/19 1253      Assessment   Medical Diagnosis  S/P ORIF left wrist      Precautions   Precautions  Other (comment)    Precaution Comments  No lifting over 5lbs               OT Treatments/Exercises (OP) - 11/27/19 0001      Exercises   Exercises  Wrist;Hand;Elbow;Theraputty      Elbow Exercises   Forearm Supination  Strengthening;10 reps    Bar Weights/Barbell (Forearm Supination)  2 lbs    Forearm Pronation  Strengthening;10 reps    Bar Weights/Barbell (Forearm Pronation)  2 lbs      Additional  Elbow Exercises   Hand Gripper with Large Beads  all beads with gripper set at 11#    Hand Gripper with Medium Beads  1 bead with gripper set at 11#      Weighted Stretch Over Towel Roll   Supination - Weighted Stretch  2 pounds;60 seconds   2X   Pronation - Weighted Stretch  2 pounds;60 seconds   2X   Wrist Flexion - Weighted Stretch  2 pounds;60 seconds   2X   Wrist Extension - Weighted Stretch  2 pounds;60 seconds   2X     Wrist Exercises   Wrist Flexion  PROM;Strengthening;10 reps    Bar Weights/Barbell (Wrist Flexion)  2 lbs    Wrist Extension  PROM;Strengthening;10 reps    Bar Weights/Barbell (Wrist Extension)  2 lbs    Wrist Radial Deviation  Strengthening;10 reps    Bar Weights/Barbell (Radial Deviation)  2 lbs    Wrist Ulnar Deviation  Strengthening;10 reps    Bar Weights/Barbell (Ulnar Deviation)  2 lbs      Additional Wrist Exercises   Sponges  8,8,8  OT Education - 11/27/19 1254    Education Details  Discussed possible benefit of a JAS brace to increase wrist flexion/extension. Forms filled out, notes printed, and request was faxed to [employer] representative.    Person(s) Educated  Patient    Methods  Explanation    Comprehension  Verbalized understanding       OT Short Term Goals - 10/14/19 1357      OT SHORT TERM GOAL #1   Title  Patient will be educated and independent with HEP in order to faciliate progress in therapy and allow her to return to using her Left UE as her non dominant extremity for 75% or more of daily tasks.    Time  3    Period  Weeks    Status  On-going    Target Date  10/30/19      OT SHORT TERM GOAL #2   Title  Patient will increase P/ROM to [facility] in order to begin using her left hand as an active assist with basic tasks such as turn the lightswitch on and off.    Time  3    Period  Weeks    Status  On-going        OT Long Term Goals - 10/14/19 1049      OT LONG TERM GOAL #1   Title  Patient will increase Left  wrist and hand A/ROM to [facility] in order to be able to stabilize containers and jars when attempting to open them.    Time  6    Period  Weeks    Status  On-going      OT LONG TERM GOAL #2   Title  Patient will increase her grip strength by 15# and her pinch strength by 6# in order to return to holding onto items without the needing to drop them.    Time  6    Period  Weeks    Status  On-going      OT LONG TERM GOAL #3   Title  Patient will increase her left hand coordination by completing the 9 hole peg test in 20 seconds or less.    Time  6    Period  Weeks    Status  On-going      OT LONG TERM GOAL #4   Title  Patient will increase her left wrist strength to 4/5 in order to pick up and carry lightweight household items with less items.    Time  6    Period  Weeks    Status  On-going      OT LONG TERM GOAL #5   Title  Patient will decrease fascial restrictions to min amount or less in order to increase the functional mobility needed to complete functional tasks such as grasping a doorknob to open.    Time  6    Period  Weeks    Status  On-going            Plan - 11/27/19 1255    Clinical Impression Statement  A: Utilized hand weight for wrist stretch while increasing from 1 to 2 sets. JAS brace education completed with request faxed at end of session. Discussed using an ACE wrap around flexed fingers while in the flexion glove to increase amount of stretch provided.    Body Structure / Function / Physical Skills  ADL;UE functional use;Fascial restriction;Pain;FMC;ROM;GMC;Coordination;Decreased knowledge of precautions;Decreased knowledge of use of DME;Edema;Strength;IADL    Plan  P: Complete handgripper task  while picking up all medium beads at 11# resistance. Attempt small beads.    Consulted and Agree with Plan of Care  Patient       Patient will benefit from skilled therapeutic intervention in order to improve the following deficits and impairments:   Body Structure /  Function / Physical Skills: ADL, UE functional use, Fascial restriction, Pain, FMC, ROM, GMC, Coordination, Decreased knowledge of precautions, Decreased knowledge of use of DME, Edema, Strength, IADL       Visit Diagnosis: Stiffness of left wrist, not elsewhere classified  Generalized edema  Pain in left wrist  Other lack of coordination  Other symptoms and signs involving the musculoskeletal system    Problem List Patient Active Problem List   Diagnosis Date Noted  . Fracture of left wrist with routine healing 10/04/2019  . Fracture, [place], left, closed 09/09/2019  . S/P ORIF (open reduction internal fixation) fracture 09/05/2019 left wrist  09/09/2019  . Left wrist fracture   . Hypertension 09/02/2019   Ailene Ravel, OTR/L,CBIS  [phone]  11/27/2019, 1:02 PM  [place] [address] [place], [place], [address] Phone: [phone]   Fax:  [phone]  Name: Karizma Reust MRN: [id number] Date of Birth: 1951/05/26

## 2019-11-29 ENCOUNTER — Encounter (HOSPITAL_COMMUNITY): Payer: Self-pay

## 2019-11-29 ENCOUNTER — Other Ambulatory Visit: Payer: Self-pay

## 2019-11-29 ENCOUNTER — Ambulatory Visit (HOSPITAL_COMMUNITY): Payer: Medicare Other

## 2019-11-29 DIAGNOSIS — R29898 Other symptoms and signs involving the musculoskeletal system: Secondary | ICD-10-CM

## 2019-11-29 DIAGNOSIS — M25632 Stiffness of left wrist, not elsewhere classified: Secondary | ICD-10-CM

## 2019-11-29 DIAGNOSIS — M25532 Pain in left wrist: Secondary | ICD-10-CM

## 2019-11-29 DIAGNOSIS — R278 Other lack of coordination: Secondary | ICD-10-CM

## 2019-11-29 NOTE — Therapy (Signed)
[place], [place], [address] Phone: [phone]   Fax:  [phone]  Occupational Therapy Treatment  Patient Details  Name: Victoria Dominguez MRN: [id number] Date of Birth: 01-20-1951 Referring Provider (OT): Arther Abbott, MD   Encounter Date: 11/29/2019  OT End of Session - 11/29/19 1014    Visit Number  16    Number of Visits  24    Date for OT Re-Evaluation  12/23/19    Authorization Type  medicare    Authorization Time Period  progress note at visit 53    Authorization - Visit Number  16    Authorization - Number of Visits  19    OT Start Time  0902    OT Stop Time  0940    OT Time Calculation (min)  38 min    Activity Tolerance  Patient tolerated treatment well    Behavior During Therapy  [facility] for tasks assessed/performed       Past Medical History:  Diagnosis Date  . Hypertension     Past Surgical History:  Procedure Laterality Date  . ABDOMINAL HYSTERECTOMY    . ORIF WRIST FRACTURE Left 09/05/2019   Procedure: OPEN REDUCTION INTERNAL FIXATION (ORIF) WRIST FRACTURE;  Surgeon: Carole Civil, MD;  Location: [facility];  Service: Orthopedics;  Laterality: Left;    There were no vitals filed for this visit.  Subjective Assessment - 11/29/19 0909    Subjective   S: I just got the heated pad in yesterday.    Currently in Pain?  No/denies         [facility] OT Assessment - 11/29/19 0910      Assessment   Medical Diagnosis  S/P ORIF left wrist      Precautions   Precautions  Other (comment)    Precaution Comments  No lifting over 5lbs               OT Treatments/Exercises (OP) - 11/29/19 0910      Exercises   Exercises  Wrist;Hand;Elbow;Theraputty      Additional Elbow Exercises   Hand Gripper with Large Beads  all beads with gripper set at 11#    Hand Gripper with Medium Beads  all beads with gripper set at 11#   half completed with gripper horizontal   Hand Gripper with Small  Beads  all beads with gripper set at 11#   horizontal     Hand Exercises   MCPJ Flexion  PROM;5 reps    MCPJ Extension  PROM;5 reps    PIPJ Flexion  PROM;5 reps    PIPJ Extension  PROM;5 reps    DIPJ Flexion  PROM;5 reps    DIPJ Extension  PROM;5 reps    Other Hand Exercises  Utilized red resistive clothespin to pick up 30 sponges and stack 4 high using a 3 point pinch.       Modalities   Modalities  Paraffin;Moist Heat      Moist Heat Therapy   Number Minutes Moist Heat  5 Minutes    Moist Heat Location  Hand;Wrist      LUE Paraffin   Number Minutes Paraffin  5 Minutes    LUE Paraffin Location  Hand;Wrist    Comments  to decrease stiffness      Fine Motor Coordination (Hand/Wrist)   Fine Motor Coordination  Grooved pegs    Stacking coins  Picking up one peg at a time, patient placed all grooved pegs  in pegboard. Removed with tweezers. Completed task with no difficulty.               OT Short Term Goals - 10/14/19 1357      OT SHORT TERM GOAL #1   Title  Patient will be educated and independent with HEP in order to faciliate progress in therapy and allow her to return to using her Left UE as her non dominant extremity for 75% or more of daily tasks.    Time  3    Period  Weeks    Status  On-going    Target Date  10/30/19      OT SHORT TERM GOAL #2   Title  Patient will increase P/ROM to [facility] in order to begin using her left hand as an active assist with basic tasks such as turn the lightswitch on and off.    Time  3    Period  Weeks    Status  On-going        OT Long Term Goals - 10/14/19 1049      OT LONG TERM GOAL #1   Title  Patient will increase Left wrist and hand A/ROM to [facility] in order to be able to stabilize containers and jars when attempting to open them.    Time  6    Period  Weeks    Status  On-going      OT LONG TERM GOAL #2   Title  Patient will increase her grip strength by 15# and her pinch strength by 6# in order to return to holding onto  items without the needing to drop them.    Time  6    Period  Weeks    Status  On-going      OT LONG TERM GOAL #3   Title  Patient will increase her left hand coordination by completing the 9 hole peg test in 20 seconds or less.    Time  6    Period  Weeks    Status  On-going      OT LONG TERM GOAL #4   Title  Patient will increase her left wrist strength to 4/5 in order to pick up and carry lightweight household items with less items.    Time  6    Period  Weeks    Status  On-going      OT LONG TERM GOAL #5   Title  Patient will decrease fascial restrictions to min amount or less in order to increase the functional mobility needed to complete functional tasks such as grasping a doorknob to open.    Time  6    Period  Weeks    Status  On-going            Plan - 11/29/19 1015    Clinical Impression Statement  A; Utilized paraffin wax and moist heat at start of session to help  decrease muscle tightness and fascial restrictions. Pt was able to complete handgripper task while picking all 3 sizes of beads with increased time. Pinch strength and coordination task was completed with minimal difficulty.VC for form and technique were provided.    Body Structure / Function / Physical Skills  ADL;UE functional use;Fascial restriction;Pain;FMC;ROM;GMC;Coordination;Decreased knowledge of precautions;Decreased knowledge of use of DME;Edema;Strength;IADL    Plan  P: Start with paraffin and heat. Continue with grip and pinch strength while also working on increansing mobility of hand in order to get closer to making a complete fist.  Patient will benefit from skilled therapeutic intervention in order to improve the following deficits and impairments:   Body Structure / Function / Physical Skills: ADL, UE functional use, Fascial restriction, Pain, FMC, ROM, GMC, Coordination, Decreased knowledge of precautions, Decreased knowledge of use of DME, Edema, Strength, IADL       Visit  Diagnosis: Stiffness of left wrist, not elsewhere classified  Pain in left wrist  Other lack of coordination  Other symptoms and signs involving the musculoskeletal system    Problem List Patient Active Problem List   Diagnosis Date Noted  . Fracture of left wrist with routine healing 10/04/2019  . Fracture, Col[place], left, closed 09/09/2019  . S/P ORIF (open reduction internal fixation) fracture 09/05/2019 left wrist  09/09/2019  . Left wrist fracture   . Hypertension 09/02/2019   Ailene Ravel, OTR/L,CBIS  [phone]  11/29/2019, 11:15 AM  [place], [place], [address] Phone: [phone]   Fax:  [phone]  Name: Dezzie Stickels MRN: [id number] Date of Birth: 1951/06/22

## 2019-12-02 ENCOUNTER — Telehealth (HOSPITAL_COMMUNITY): Payer: Self-pay

## 2019-12-02 ENCOUNTER — Telehealth: Payer: Self-pay | Admitting: Orthopedic Surgery

## 2019-12-02 DIAGNOSIS — Z8781 Personal history of (healed) traumatic fracture: Secondary | ICD-10-CM

## 2019-12-02 DIAGNOSIS — Z9889 Other specified postprocedural states: Secondary | ICD-10-CM

## 2019-12-02 NOTE — Telephone Encounter (Signed)
S/w patient -Pt understands She will come in at [facility] on 12/04/2019.

## 2019-12-02 NOTE — Telephone Encounter (Signed)
Call from [place]) received today, 12/02/19, from [place] per voice message left on Friday, 11/29/19 after clinic hours, requesting to verify Dr Harrison's current office location, as she still has 2 locations on file. States is working on a DME device/order.  I returned call upon receipt of message to ph [phone], ext [phone], reached her voice mail - left message verifying our current address and to delete the previous address.

## 2019-12-04 ENCOUNTER — Ambulatory Visit (HOSPITAL_COMMUNITY): Payer: Medicare Other

## 2019-12-04 ENCOUNTER — Ambulatory Visit (HOSPITAL_COMMUNITY): Payer: Medicare Other | Admitting: Occupational Therapy

## 2019-12-04 ENCOUNTER — Other Ambulatory Visit: Payer: Self-pay

## 2019-12-04 DIAGNOSIS — M25632 Stiffness of left wrist, not elsewhere classified: Secondary | ICD-10-CM | POA: Diagnosis not present

## 2019-12-04 NOTE — Therapy (Signed)
[place], [place], [address] Phone: [phone]   Fax:  [phone]  Occupational Therapy Treatment  Patient Details  Name: Victoria Dominguez MRN: [id number] Date of Birth: 1951-02-02 Referring Provider (OT): Arther Abbott, MD   Encounter Date: 12/04/2019  OT End of Session - 12/04/19 1745    Visit Number  17    Number of Visits  24    Date for OT Re-Evaluation  12/23/19    Authorization Type  medicare    Authorization Time Period  progress note at visit 79    Authorization - Visit Number  40    Authorization - Number of Visits  19    OT Start Time  1200    OT Stop Time  1244    OT Time Calculation (min)  44 min    Activity Tolerance  Patient tolerated treatment well    Behavior During Therapy  [facility] for tasks assessed/performed       Past Medical History:  Diagnosis Date  . Hypertension     Past Surgical History:  Procedure Laterality Date  . ABDOMINAL HYSTERECTOMY    . ORIF WRIST FRACTURE Left 09/05/2019   Procedure: OPEN REDUCTION INTERNAL FIXATION (ORIF) WRIST FRACTURE;  Surgeon: Carole Civil, MD;  Location: [facility];  Service: Orthopedics;  Laterality: Left;    There were no vitals filed for this visit.  Subjective Assessment - 12/04/19 1735    Subjective   I am concerned that my grip is not better by now    Pertinent History  Patient is a 69 y/o female S/P left ORIF volar plate distal radius fracture with unlar styloid fracture; healed. Surgery was completed on 09/05/19 due to a mechanical fall. Dr. Aline Brochure has referred patient to occupational therapy for evaluation and treatment.    Patient Stated Goals  To be able to use my hand again.    Currently in Pain?  No/denies                   OT Treatments/Exercises (OP) - 12/04/19 0001      Exercises   Exercises  Elbow;Wrist;Hand      Elbow Exercises   Forearm Supination  AROM;AAROM;Strengthening    Forearm Pronation   AROM;AAROM;Strengthening    Other elbow exercises  wrist roll with 1# weighted cable      Wrist Exercises   Wrist Flexion  PROM;AAROM;Strengthening    Wrist Extension  PROM;AAROM;Strengthening    Wrist Radial Deviation  AROM;Strengthening    Wrist Ulnar Deviation  AROM;Strengthening      Hand Exercises   MCPJ Flexion  AAROM    MCPJ Extension  AAROM    PIPJ Flexion  AAROM    PIPJ Extension  AAROM    DIPJ Flexion  AAROM    DIPJ Extension  AAROM    Digit Composite ABduction  AROM    Digit Composite ADduction  AROM      Modalities   Modalities  Paraffin      LUE Paraffin   Number Minutes Paraffin  5 Minutes    LUE Paraffin Location  Hand;Wrist    Comments  prior to stretch and activity      Manual Therapy   Joint Mobilization  focus on palm of hand, MP/PIP/DIP all digits      Fine Motor Coordination (Hand/Wrist)   In Hand Manipulation Training  --   utilized med beads for dexterity activity  OT Education - 12/04/19 1744    Education Details  reviewed techniques to assist with hand/joint mobility, gross flexion and dexterity    Person(s) Educated  Patient    Methods  Explanation;Demonstration    Comprehension  Verbalized understanding;Returned demonstration       OT Short Term Goals - 10/14/19 1357      OT SHORT TERM GOAL #1   Title  Patient will be educated and independent with HEP in order to faciliate progress in therapy and allow her to return to using her Left UE as her non dominant extremity for 75% or more of daily tasks.    Time  3    Period  Weeks    Status  On-going    Target Date  10/30/19      OT SHORT TERM GOAL #2   Title  Patient will increase P/ROM to [facility] in order to begin using her left hand as an active assist with basic tasks such as turn the lightswitch on and off.    Time  3    Period  Weeks    Status  On-going        OT Long Term Goals - 10/14/19 1049      OT LONG TERM GOAL #1   Title  Patient will increase Left wrist  and hand A/ROM to [facility] in order to be able to stabilize containers and jars when attempting to open them.    Time  6    Period  Weeks    Status  On-going      OT LONG TERM GOAL #2   Title  Patient will increase her grip strength by 15# and her pinch strength by 6# in order to return to holding onto items without the needing to drop them.    Time  6    Period  Weeks    Status  On-going      OT LONG TERM GOAL #3   Title  Patient will increase her left hand coordination by completing the 9 hole peg test in 20 seconds or less.    Time  6    Period  Weeks    Status  On-going      OT LONG TERM GOAL #4   Title  Patient will increase her left wrist strength to 4/5 in order to pick up and carry lightweight household items with less items.    Time  6    Period  Weeks    Status  On-going      OT LONG TERM GOAL #5   Title  Patient will decrease fascial restrictions to min amount or less in order to increase the functional mobility needed to complete functional tasks such as grasping a doorknob to open.    Time  6    Period  Weeks    Status  On-going            Plan - 12/04/19 1745    Clinical Impression Statement  patient states that she is concerned about gross flexion and functional grip.  Added ace wrap and increased rate for dexterity activities to provide prolonged stretch with good results.    Body Structure / Function / Physical Skills  ADL;UE functional use;Fascial restriction;Pain;FMC;ROM;GMC;Coordination;Decreased knowledge of precautions;Decreased knowledge of use of DME;Edema;Strength;IADL    Rehab Potential  Excellent    OT Treatment/Interventions  Self-care/ADL training;Ultrasound;DME and/or AE instruction;Patient/family education;Paraffin;Passive range of motion;Cryotherapy;Electrical Stimulation;Moist Heat;Neuromuscular education;Therapeutic activities;Manual Therapy;Therapeutic exercise;Splinting    Plan  P: Start  with paraffin and heat. Continue with grip and pinch  strength while also working on increansing mobility of hand in order to get closer to making a complete fist.    Consulted and Agree with Plan of Care  Patient       Patient will benefit from skilled therapeutic intervention in order to improve the following deficits and impairments:   Body Structure / Function / Physical Skills: ADL, UE functional use, Fascial restriction, Pain, FMC, ROM, GMC, Coordination, Decreased knowledge of precautions, Decreased knowledge of use of DME, Edema, Strength, IADL       Visit Diagnosis: Stiffness of left wrist, not elsewhere classified    Problem List Patient Active Problem List   Diagnosis Date Noted  . Fracture of left wrist with routine healing 10/04/2019  . Fracture, [place], left, closed 09/09/2019  . S/P ORIF (open reduction internal fixation) fracture 09/05/2019 left wrist  09/09/2019  . Left wrist fracture   . Hypertension 09/02/2019    Carlos Levering 12/04/2019, 5:51 PM  [place] [address] [place], [place], [address] Phone: [phone]   Fax:  [phone]  Name: Victoria Dominguez MRN: [id number] Date of Birth: [birth year]

## 2019-12-06 ENCOUNTER — Ambulatory Visit (HOSPITAL_COMMUNITY): Payer: Medicare Other | Admitting: Occupational Therapy

## 2019-12-06 ENCOUNTER — Other Ambulatory Visit: Payer: Self-pay

## 2019-12-06 DIAGNOSIS — M25632 Stiffness of left wrist, not elsewhere classified: Secondary | ICD-10-CM

## 2019-12-06 NOTE — Therapy (Signed)
[place], [place], [address] Phone: [phone]   Fax:  [phone]  Occupational Therapy Treatment  Patient Details  Name: Victoria Dominguez MRN: [id number] Date of Birth: Jan 04, 1951 Referring Provider (OT): Arther Abbott, MD   Encounter Date: 12/06/2019  OT End of Session - 12/06/19 1029    Visit Number  18    Number of Visits  24    Date for OT Re-Evaluation  12/23/19    Authorization Type  medicare    Authorization Time Period  progress note at visit 37    Authorization - Visit Number  18    Authorization - Number of Visits  19    OT Start Time  0948    OT Stop Time  1030    OT Time Calculation (min)  42 min       Past Medical History:  Diagnosis Date  . Hypertension     Past Surgical History:  Procedure Laterality Date  . ABDOMINAL HYSTERECTOMY    . ORIF WRIST FRACTURE Left 09/05/2019   Procedure: OPEN REDUCTION INTERNAL FIXATION (ORIF) WRIST FRACTURE;  Surgeon: Carole Civil, MD;  Location: [facility];  Service: Orthopedics;  Laterality: Left;    There were no vitals filed for this visit.  Subjective Assessment - 12/06/19 0953    Subjective   I really worked my hand over the last few days, I spent alot of time on massage at the palm    Pertinent History  Patient is a 69 y/o female S/P left ORIF volar plate distal radius fracture with unlar styloid fracture; healed. Surgery was completed on 09/05/19 due to a mechanical fall. Dr. Aline Brochure has referred patient to occupational therapy for evaluation and treatment.    Patient Stated Goals  To be able to use my hand again.    Currently in Pain?  Yes    Pain Score  3     Pain Location  Hand    Pain Orientation  Left    Pain Descriptors / Indicators  Discomfort    Pain Type  Acute pain    Pain Frequency  Intermittent                   OT Treatments/Exercises (OP) - 12/06/19 0001      Wrist Exercises   Other wrist exercises  wrist  flexion/ext, rad/uln dev, sup pron while hand in prolonged stretch with ace banadage    Other wrist exercises  wrist roll with 2# on cable - 1 rep in each direction      Additional Wrist Exercises   Purdue Pegboard         Hand Exercises   Other Hand Exercises  dexterity activities with cards, small object manipulation, knots    Other Hand Exercises  self massage with tennis ball       Modalities   Modalities  Paraffin      LUE Paraffin   Number Minutes Paraffin  5 Minutes    LUE Paraffin Location  Hand;Wrist    Comments  start of session      Manual Therapy   Manual Therapy  Joint mobilization;Soft tissue mobilization    Joint Mobilization  wrist, palm/carpal, digits             OT Education - 12/06/19 1027    Education Details  reviewed techniques to assist with hand/joint mobility, gross flexion and dexterity, reivewed and added additional positions    Person(s) Educated  Patient    Methods  Explanation;Demonstration    Comprehension  Verbalized understanding;Returned demonstration       OT Short Term Goals - 10/14/19 1357      OT SHORT TERM GOAL #1   Title  Patient will be educated and independent with HEP in order to faciliate progress in therapy and allow her to return to using her Left UE as her non dominant extremity for 75% or more of daily tasks.    Time  3    Period  Weeks    Status  On-going    Target Date  10/30/19      OT SHORT TERM GOAL #2   Title  Patient will increase P/ROM to [facility] in order to begin using her left hand as an active assist with basic tasks such as turn the lightswitch on and off.    Time  3    Period  Weeks    Status  On-going        OT Long Term Goals - 10/14/19 1049      OT LONG TERM GOAL #1   Title  Patient will increase Left wrist and hand A/ROM to [facility] in order to be able to stabilize containers and jars when attempting to open them.    Time  6    Period  Weeks    Status  On-going      OT LONG TERM GOAL #2   Title   Patient will increase her grip strength by 15# and her pinch strength by 6# in order to return to holding onto items without the needing to drop them.    Time  6    Period  Weeks    Status  On-going      OT LONG TERM GOAL #3   Title  Patient will increase her left hand coordination by completing the 9 hole peg test in 20 seconds or less.    Time  6    Period  Weeks    Status  On-going      OT LONG TERM GOAL #4   Title  Patient will increase her left wrist strength to 4/5 in order to pick up and carry lightweight household items with less items.    Time  6    Period  Weeks    Status  On-going      OT LONG TERM GOAL #5   Title  Patient will decrease fascial restrictions to min amount or less in order to increase the functional mobility needed to complete functional tasks such as grasping a doorknob to open.    Time  6    Period  Weeks    Status  On-going            Plan - 12/06/19 1213    Clinical Impression Statement  Patient states that she is consistently moving her hand at home, she felt as though the palm massage helped her mobility but she continues to be concerned about limited grasp.  She tolerates therapy session with prolonged stretch well.  Improved grasp after manual therapy/manipulation    Plan  P: Start with paraffin and heat. Continue with grip and pinch strength while also working on increasing mobility of hand in order to get closer to making a complete fist.    Consulted and Agree with Plan of Care  Patient       Patient will benefit from skilled therapeutic intervention in order to improve the following deficits and impairments:  Visit Diagnosis: Stiffness of left wrist, not elsewhere classified    Problem List Patient Active Problem List   Diagnosis Date Noted  . Fracture of left wrist with routine healing 10/04/2019  . Fracture, [place], left, closed 09/09/2019  . S/P ORIF (open reduction internal fixation) fracture 09/05/2019 left wrist   09/09/2019  . Left wrist fracture   . Hypertension 09/02/2019    Carlos Levering 12/06/2019, 12:17 PM  [place], [place], [address] Phone: [phone]   Fax:  [phone]  Name: Victoria Dominguez MRN: [id number] Date of Birth: Victoria Dominguez, 1952

## 2019-12-09 ENCOUNTER — Encounter (HOSPITAL_COMMUNITY): Payer: Self-pay | Admitting: Occupational Therapy

## 2019-12-09 ENCOUNTER — Ambulatory Visit (HOSPITAL_COMMUNITY): Payer: Medicare Other | Admitting: Occupational Therapy

## 2019-12-09 ENCOUNTER — Other Ambulatory Visit: Payer: Self-pay

## 2019-12-09 DIAGNOSIS — M25632 Stiffness of left wrist, not elsewhere classified: Secondary | ICD-10-CM | POA: Diagnosis not present

## 2019-12-09 DIAGNOSIS — R278 Other lack of coordination: Secondary | ICD-10-CM

## 2019-12-09 DIAGNOSIS — R29898 Other symptoms and signs involving the musculoskeletal system: Secondary | ICD-10-CM

## 2019-12-09 NOTE — Therapy (Addendum)
[place], [place], [address] Phone: [phone]   Fax:  [phone]  Occupational Therapy Treatment  Patient Details  Name: Victoria Dominguez MRN: [id number] Date of Birth: 04-Oct-1951 Referring Provider (OT): Arther Abbott, MD   Progress Note Reporting Period 11/05/2019 to 12/09/2019  See note below for Objective Data and Assessment of Progress/Goals.       Encounter Date: 12/09/2019  OT End of Session - 12/09/19 1029    Visit Number  19    Number of Visits  24    Date for OT Re-Evaluation  12/23/19    Authorization Type  medicare    Authorization Time Period  progress note at visit 72    Authorization - Visit Number  36    Authorization - Number of Visits  69    OT Start Time  0940    OT Stop Time  1020    OT Time Calculation (min)  40 min       Past Medical History:  Diagnosis Date  . Hypertension     Past Surgical History:  Procedure Laterality Date  . ABDOMINAL HYSTERECTOMY    . ORIF WRIST FRACTURE Left 09/05/2019   Procedure: OPEN REDUCTION INTERNAL FIXATION (ORIF) WRIST FRACTURE;  Surgeon: Carole Civil, MD;  Location: [facility];  Service: Orthopedics;  Laterality: Left;    There were no vitals filed for this visit.  Subjective Assessment - 12/09/19 0938    Subjective   S: It's getting better in picking up things around the house but I still can't make a fist.    Currently in Pain?  No/denies         [facility] OT Assessment - 12/09/19 0938      Assessment   Medical Diagnosis  S/P ORIF left wrist      Precautions   Precautions  Other (comment)    Precaution Comments  No lifting over 5lbs               OT Treatments/Exercises (OP) - 12/09/19 0948      Exercises   Exercises  Elbow;Wrist;Hand;Theraputty      Additional Elbow Exercises   Theraputty - Flatten  red working on digit extension    Theraputty - Grip  red-supination/pronation    Hand Gripper with Large Beads   all beads with gripper set at 15#    Hand Gripper with Medium Beads  all beads with gripper set at 15#    Hand Gripper with Small Beads  all beads with gripper set at 15#   horizontal      Weighted Stretch Over Towel Roll   Supination - Weighted Stretch  2 pounds;60 seconds   2X   Wrist Flexion - Weighted Stretch  2 pounds;60 seconds   2X   Wrist Extension - Weighted Stretch  2 pounds;60 seconds   2X     Wrist Exercises   Wrist Flexion  Theraband;10 reps    Theraband Level (Wrist Flexion)  Level 2 (Red)    Wrist Extension  Theraband;10 reps    Theraband Level (Wrist Extension)  Level 2 (Red)    Wrist Radial Deviation  Theraband;10 reps    Theraband Level (Radial Deviation)  Level 2 (Red)    Wrist Ulnar Deviation  Theraband;10 reps    Theraband Level (Ulnar Deviation)  Level 2 (Red)    Other wrist exercises  supination, red theraband, 10X each      Hand Exercises   MCPJ  Flexion  PROM;5 reps    MCPJ Extension  PROM;5 reps    PIPJ Flexion  PROM;5 reps    PIPJ Extension  PROM;5 reps    DIPJ Flexion  PROM;5 reps    DIPJ Extension  PROM;5 reps      Theraputty   Theraputty - Pinch  red-3 point      Modalities   Modalities  Paraffin      Moist Heat Therapy   Number Minutes Moist Heat  5 Minutes    Moist Heat Location  Hand;Wrist      LUE Paraffin   Number Minutes Paraffin  5 Minutes    LUE Paraffin Location  Hand;Wrist    Comments  To decrease stiffness               OT Short Term Goals - 10/14/19 1357      OT SHORT TERM GOAL #1   Title  Patient will be educated and independent with HEP in order to faciliate progress in therapy and allow her to return to using her Left UE as her non dominant extremity for 75% or more of daily tasks.    Time  3    Period  Weeks    Status  On-going    Target Date  10/30/19      OT SHORT TERM GOAL #2   Title  Patient will increase P/ROM to [facility] in order to begin using her left hand as an active assist with basic tasks such as  turn the lightswitch on and off.    Time  3    Period  Weeks    Status  On-going        OT Long Term Goals - 10/14/19 1049      OT LONG TERM GOAL #1   Title  Patient will increase Left wrist and hand A/ROM to [facility] in order to be able to stabilize containers and jars when attempting to open them.    Time  6    Period  Weeks    Status  On-going      OT LONG TERM GOAL #2   Title  Patient will increase her grip strength by 15# and her pinch strength by 6# in order to return to holding onto items without the needing to drop them.    Time  6    Period  Weeks    Status  On-going      OT LONG TERM GOAL #3   Title  Patient will increase her left hand coordination by completing the 9 hole peg test in 20 seconds or less.    Time  6    Period  Weeks    Status  On-going      OT LONG TERM GOAL #4   Title  Patient will increase her left wrist strength to 4/5 in order to pick up and carry lightweight household items with less items.    Time  6    Period  Weeks    Status  On-going      OT LONG TERM GOAL #5   Title  Patient will decrease fascial restrictions to min amount or less in order to increase the functional mobility needed to complete functional tasks such as grasping a doorknob to open.    Time  6    Period  Weeks    Status  On-going            Plan - 12/09/19 1017    Clinical  Impression Statement  A: Pt reports she messaged the MD via MyChart and he has referred her to a hand specialist. Per chart review to Dr. Daryll Brod. Pt reports she is using her hand as much as possible and is able to complete some light carrying tasks like laundry baskets, however cannot make a fist. Session focusing on wrist and hand ROM, continued with weighted stretches and grip strengthening using hand gripper. Pt able to increase gripper resistance and complete all beads in appropriate time frame today. Added theraputty tasks for grip and pinch, as well as digit extension. Verbal cuing for form and  technique.    Body Structure / Function / Physical Skills  ADL;UE functional use;Fascial restriction;Pain;FMC;ROM;GMC;Coordination;Decreased knowledge of precautions;Decreased knowledge of use of DME;Edema;Strength;IADL    Plan  P: Follow up on hand specialist scheduling. Continue working on hand and wrist mobility, focusing hand activities on grip and making a fist       Patient will benefit from skilled therapeutic intervention in order to improve the following deficits and impairments:   Body Structure / Function / Physical Skills: ADL, UE functional use, Fascial restriction, Pain, FMC, ROM, GMC, Coordination, Decreased knowledge of precautions, Decreased knowledge of use of DME, Edema, Strength, IADL       Visit Diagnosis: Stiffness of left wrist, not elsewhere classified  Other lack of coordination  Other symptoms and signs involving the musculoskeletal system    Problem List Patient Active Problem List   Diagnosis Date Noted  . Fracture of left wrist with routine healing 10/04/2019  . Fracture, Col[place], left, closed 09/09/2019  . S/P ORIF (open reduction internal fixation) fracture 09/05/2019 left wrist  09/09/2019  . Left wrist fracture   . Hypertension 09/02/2019   Guadelupe Sabin, OTR/L  [phone] 12/09/2019, 11:12 AM  [place], [place], [address] Phone: [phone]   Fax:  [phone]  Name: Victoria Dominguez MRN: [id number] Date of Birth: 1951/08/31

## 2019-12-11 ENCOUNTER — Ambulatory Visit (HOSPITAL_COMMUNITY): Payer: Medicare Other

## 2019-12-12 ENCOUNTER — Ambulatory Visit (HOSPITAL_COMMUNITY): Payer: Medicare Other

## 2019-12-12 ENCOUNTER — Encounter (HOSPITAL_COMMUNITY): Payer: Self-pay

## 2019-12-12 ENCOUNTER — Other Ambulatory Visit: Payer: Self-pay

## 2019-12-12 DIAGNOSIS — M25532 Pain in left wrist: Secondary | ICD-10-CM

## 2019-12-12 DIAGNOSIS — M25632 Stiffness of left wrist, not elsewhere classified: Secondary | ICD-10-CM

## 2019-12-12 DIAGNOSIS — R278 Other lack of coordination: Secondary | ICD-10-CM

## 2019-12-12 DIAGNOSIS — R29898 Other symptoms and signs involving the musculoskeletal system: Secondary | ICD-10-CM

## 2019-12-12 NOTE — Therapy (Signed)
[place], [place], [address] Phone: [phone]   Fax:  [phone]  Occupational Therapy Treatment  Patient Details  Name: Victoria Dominguez MRN: [id number] Date of Birth: [birth year] Referring Provider (OT): Arther Abbott, MD   Encounter Date: 12/12/2019  OT End of Session - 12/12/19 1421    Visit Number  20    Number of Visits  24    Date for OT Re-Evaluation  12/23/19    Authorization Type  medicare    Authorization Time Period  progress note at visit 63    Authorization - Visit Number  20    Authorization - Number of Visits  29    OT Start Time  1345    OT Stop Time  1424    OT Time Calculation (min)  39 min    Activity Tolerance  Patient tolerated treatment well    Behavior During Therapy  [facility] for tasks assessed/performed       Past Medical History:  Diagnosis Date  . Hypertension     Past Surgical History:  Procedure Laterality Date  . ABDOMINAL HYSTERECTOMY    . ORIF WRIST FRACTURE Left 09/05/2019   Procedure: OPEN REDUCTION INTERNAL FIXATION (ORIF) WRIST FRACTURE;  Surgeon: Carole Civil, MD;  Location: [facility];  Service: Orthopedics;  Laterality: Left;    There were no vitals filed for this visit.  Subjective Assessment - 12/12/19 1408    Subjective   S: I haven't heard anything from the hand specialist. No one has called me.    Currently in Pain?  Yes    Pain Score  3     Pain Location  Wrist    Pain Orientation  Left    Pain Descriptors / Indicators  Sore    Pain Type  Acute pain    Pain Radiating Towards  N/A    Pain Onset  In the past 7 days    Pain Frequency  Intermittent    Aggravating Factors   increased use    Pain Relieving Factors  nothing used at this time.    Effect of Pain on Daily Activities  min effect    Multiple Pain Sites  No         [facility] OT Assessment - 12/12/19 1410      Assessment   Medical Diagnosis  S/P ORIF left wrist      Precautions   Precautions   Other (comment)    Precaution Comments  No lifting over 5lbs               OT Treatments/Exercises (OP) - 12/12/19 1410      Exercises   Exercises  Elbow;Wrist;Hand;Theraputty      Additional Elbow Exercises   Hand Gripper with Large Beads  all beads with gripper set at 15 then all but 3 at 29#    Hand Gripper with Medium Beads  all beads with gripper set at 15 then all but 4 at 29#    Hand Gripper with Small Beads  all beads with gripper set at 15#   horizontal     Weighted Stretch Over Towel Roll   Supination - Weighted Stretch  3 pounds;30 seconds   2X   Wrist Flexion - Weighted Stretch  3 pounds;30 seconds   2X   Wrist Extension - Weighted Stretch  3 pounds;30 seconds   2X     Modalities   Modalities  Paraffin  Moist Heat Therapy   Number Minutes Moist Heat  5 Minutes    Moist Heat Location  Hand;Wrist      LUE Paraffin   Number Minutes Paraffin  5 Minutes    LUE Paraffin Location  Hand;Wrist    Comments  to decrease stiffness             OT Education - 12/12/19 1420    Education Details  Provided patient with name of hand specialist Daryll Brod) which Dr Aline Brochure placed a referral for. Address and phone number provided as well. Pt was encouraged to reach out and call clinic about scheduling.    Person(s) Educated  Patient    Methods  Explanation;Handout    Comprehension  Verbalized understanding       OT Short Term Goals - 10/14/19 1357      OT SHORT TERM GOAL #1   Title  Patient will be educated and independent with HEP in order to faciliate progress in therapy and allow her to return to using her Left UE as her non dominant extremity for 75% or more of daily tasks.    Time  3    Period  Weeks    Status  On-going    Target Date  10/30/19      OT SHORT TERM GOAL #2   Title  Patient will increase P/ROM to [facility] in order to begin using her left hand as an active assist with basic tasks such as turn the lightswitch on and off.    Time  3     Period  Weeks    Status  On-going        OT Long Term Goals - 10/14/19 1049      OT LONG TERM GOAL #1   Title  Patient will increase Left wrist and hand A/ROM to [facility] in order to be able to stabilize containers and jars when attempting to open them.    Time  6    Period  Weeks    Status  On-going      OT LONG TERM GOAL #2   Title  Patient will increase her grip strength by 15# and her pinch strength by 6# in order to return to holding onto items without the needing to drop them.    Time  6    Period  Weeks    Status  On-going      OT LONG TERM GOAL #3   Title  Patient will increase her left hand coordination by completing the 9 hole peg test in 20 seconds or less.    Time  6    Period  Weeks    Status  On-going      OT LONG TERM GOAL #4   Title  Patient will increase her left wrist strength to 4/5 in order to pick up and carry lightweight household items with less items.    Time  6    Period  Weeks    Status  On-going      OT LONG TERM GOAL #5   Title  Patient will decrease fascial restrictions to min amount or less in order to increase the functional mobility needed to complete functional tasks such as grasping a doorknob to open.    Time  6    Period  Weeks    Status  On-going            Plan - 12/12/19 1546    Clinical Impression Statement  A: Pt  reports some soreness located at the healed incision with visual edema and redness. Able to increase resistance on handgripper for large and medium size beads although due to muscle fatigue could not pick up all beads. VC for form and technique were provided throughout session. Able to form a fist with therapist completing passive stretching during session.    Body Structure / Function / Physical Skills  ADL;UE functional use;Fascial restriction;Pain;FMC;ROM;GMC;Coordination;Decreased knowledge of precautions;Decreased knowledge of use of DME;Edema;Strength;IADL    Plan  P: Follow up on hand specialst scheduling. Continue  with handgripper task.    Consulted and Agree with Plan of Care  Patient       Patient will benefit from skilled therapeutic intervention in order to improve the following deficits and impairments:   Body Structure / Function / Physical Skills: ADL, UE functional use, Fascial restriction, Pain, FMC, ROM, GMC, Coordination, Decreased knowledge of precautions, Decreased knowledge of use of DME, Edema, Strength, IADL       Visit Diagnosis: Stiffness of left wrist, not elsewhere classified  Other lack of coordination  Other symptoms and signs involving the musculoskeletal system  Pain in left wrist    Problem List Patient Active Problem List   Diagnosis Date Noted  . Fracture of left wrist with routine healing 10/04/2019  . Fracture, Col[place], left, closed 09/09/2019  . S/P ORIF (open reduction internal fixation) fracture 09/05/2019 left wrist  09/09/2019  . Left wrist fracture   . Hypertension 09/02/2019   Ailene Ravel, OTR/L,CBIS  [phone]  12/12/2019, 4:01 PM  [facility] [address] [place], [place], [address] Phone: [phone]   Fax:  [phone]  Name: Victoria Dominguez MRN: [id number] Date of Birth: 08/28/1951

## 2019-12-16 ENCOUNTER — Telehealth: Payer: Self-pay | Admitting: Orthopedic Surgery

## 2019-12-16 ENCOUNTER — Other Ambulatory Visit: Payer: Self-pay

## 2019-12-16 ENCOUNTER — Encounter (HOSPITAL_COMMUNITY): Payer: Self-pay

## 2019-12-16 ENCOUNTER — Ambulatory Visit (HOSPITAL_COMMUNITY): Payer: Medicare Other

## 2019-12-16 DIAGNOSIS — M25632 Stiffness of left wrist, not elsewhere classified: Secondary | ICD-10-CM

## 2019-12-16 DIAGNOSIS — R29898 Other symptoms and signs involving the musculoskeletal system: Secondary | ICD-10-CM

## 2019-12-16 DIAGNOSIS — R278 Other lack of coordination: Secondary | ICD-10-CM

## 2019-12-16 DIAGNOSIS — M25532 Pain in left wrist: Secondary | ICD-10-CM

## 2019-12-16 NOTE — Telephone Encounter (Signed)
Called them back / thanks.  They showed to Dr Cyndia Diver, he declined, but she was not sure if we wanted sent to one of the others, and yes, that will be fine, she will call back and let us know.

## 2019-12-16 NOTE — Telephone Encounter (Signed)
Call received via voice message at 2:13pm from [facility] at [facility] regarding this patient's referral.  Please call at ph#[phone]

## 2019-12-16 NOTE — Therapy (Signed)
[place], [place], [address] Phone: [phone]   Fax:  [phone]  Occupational Therapy Treatment  Patient Details  Name: Victoria Dominguez MRN: [id number] Date of Birth: 1951-04-29 Referring Provider (OT): Arther Abbott, MD   Encounter Date: 12/16/2019  OT End of Session - 12/16/19 1148    Visit Number  21    Number of Visits  24    Date for OT Re-Evaluation  12/23/19    Authorization Type  medicare    Authorization Time Period  progress note at visit 9    Authorization - Visit Number  21    Authorization - Number of Visits  29    OT Start Time  1030    OT Stop Time  1108    OT Time Calculation (min)  38 min    Activity Tolerance  Patient tolerated treatment well    Behavior During Therapy  WFL for tasks assessed/performed       Past Medical History:  Diagnosis Date  . Hypertension     Past Surgical History:  Procedure Laterality Date  . ABDOMINAL HYSTERECTOMY    . ORIF WRIST FRACTURE Left 09/05/2019   Procedure: OPEN REDUCTION INTERNAL FIXATION (ORIF) WRIST FRACTURE;  Surgeon: Carole Civil, MD;  Location: [facility];  Service: Orthopedics;  Laterality: Left;    There were no vitals filed for this visit.  Subjective Assessment - 12/16/19 1046    Subjective   S: My hand was hurting yesterday but not right now. It feels more tight today though.    Currently in Pain?  No/denies         [facility] OT Assessment - 12/16/19 1047      Assessment   Medical Diagnosis  S/P ORIF left wrist      Precautions   Precautions  Other (comment)    Precaution Comments  No lifting over 5lbs               OT Treatments/Exercises (OP) - 12/16/19 1047      Exercises   Exercises  Elbow;Wrist;Hand;Theraputty      Additional Elbow Exercises   Theraputty - Grip  Used red putty to work on wrist flexion and extension. ten times each while imitating wringing out the washcloth    Hand Gripper with Large  Beads  all beads with gripper set at 22#    Hand Gripper with Medium Beads  all beads with gripper set at 22#    Hand Gripper with Small Beads  all beads with grippper set at 22#   horizontal     Weighted Stretch Over Towel Roll   Supination - Weighted Stretch  3 pounds;30 seconds   2 sets   Wrist Flexion - Weighted Stretch  3 pounds;30 seconds   2 sets   Wrist Extension - Weighted Stretch  3 pounds;30 seconds   2 sets     Hand Exercises   MCPJ Flexion  PROM;5 reps    MCPJ Extension  PROM;5 reps    PIPJ Flexion  PROM;5 reps    PIPJ Extension  PROM;5 reps    DIPJ Flexion  PROM;5 reps    DIPJ Extension  PROM;5 reps    Other Hand Exercises  30 sponges moved with green resistive clothespins while utilizing a 3 point pinch with the left hand.       Theraputty   Theraputty - Pinch  red: lateral       Modalities   Modalities  Moist Heat;Paraffin      Moist Heat Therapy   Number Minutes Moist Heat  5 Minutes    Moist Heat Location  Hand;Wrist      LUE Paraffin   Number Minutes Paraffin  5 Minutes    LUE Paraffin Location  Hand;Wrist    Comments  to decrease stiffness               OT Short Term Goals - 10/14/19 1357      OT SHORT TERM GOAL #1   Title  Patient will be educated and independent with HEP in order to faciliate progress in therapy and allow her to return to using her Left UE as her non dominant extremity for 75% or more of daily tasks.    Time  3    Period  Weeks    Status  On-going    Target Date  10/30/19      OT SHORT TERM GOAL #2   Title  Patient will increase P/ROM to [facility] in order to begin using her left hand as an active assist with basic tasks such as turn the lightswitch on and off.    Time  3    Period  Weeks    Status  On-going        OT Long Term Goals - 10/14/19 1049      OT LONG TERM GOAL #1   Title  Patient will increase Left wrist and hand A/ROM to [facility] in order to be able to stabilize containers and jars when attempting to open  them.    Time  6    Period  Weeks    Status  On-going      OT LONG TERM GOAL #2   Title  Patient will increase her grip strength by 15# and her pinch strength by 6# in order to return to holding onto items without the needing to drop them.    Time  6    Period  Weeks    Status  On-going      OT LONG TERM GOAL #3   Title  Patient will increase her left hand coordination by completing the 9 hole peg test in 20 seconds or less.    Time  6    Period  Weeks    Status  On-going      OT LONG TERM GOAL #4   Title  Patient will increase her left wrist strength to 4/5 in order to pick up and carry lightweight household items with less items.    Time  6    Period  Weeks    Status  On-going      OT LONG TERM GOAL #5   Title  Patient will decrease fascial restrictions to min amount or less in order to increase the functional mobility needed to complete functional tasks such as grasping a doorknob to open.    Time  6    Period  Weeks    Status  On-going            Plan - 12/16/19 1149    Clinical Impression Statement  A: Pt reports that she called the hand specialist's office and was told they need a few more things from Dr. Ruthe Mannan office and will call her when they are ready to schedule. Pt was unable to utilize 29# of resistance on the handgripper this session although completed gripper task using 22# of resistance. Able to pick up all beads with increased time and  several rest breaks due to hand weakness and limited hand mobility. VC for form and technique were provided.    Body Structure / Function / Physical Skills  ADL;UE functional use;Fascial restriction;Pain;FMC;ROM;GMC;Coordination;Decreased knowledge of precautions;Decreased knowledge of use of DME;Edema;Strength;IADL    Plan  P: Continue with increasing hand mobility while incorporating strengthening as able.    Consulted and Agree with Plan of Care  Patient       Patient will benefit from skilled therapeutic  intervention in order to improve the following deficits and impairments:   Body Structure / Function / Physical Skills: ADL, UE functional use, Fascial restriction, Pain, FMC, ROM, GMC, Coordination, Decreased knowledge of precautions, Decreased knowledge of use of DME, Edema, Strength, IADL       Visit Diagnosis: Other lack of coordination  Stiffness of left wrist, not elsewhere classified  Other symptoms and signs involving the musculoskeletal system  Pain in left wrist    Problem List Patient Active Problem List   Diagnosis Date Noted  . Fracture of left wrist with routine healing 10/04/2019  . Fracture, Col[place], left, closed 09/09/2019  . S/P ORIF (open reduction internal fixation) fracture 09/05/2019 left wrist  09/09/2019  . Left wrist fracture   . Hypertension 09/02/2019   Ailene Ravel, OTR/L,CBIS  [phone]  12/16/2019, 11:52 AM  [facility] [address] [place], [place], [address] Phone: [phone]   Fax:  [phone]  Name: Victoria Dominguez MRN: [id number] Date of Birth: 23-Jan-1951

## 2019-12-18 ENCOUNTER — Encounter (HOSPITAL_COMMUNITY): Payer: Self-pay

## 2019-12-18 ENCOUNTER — Other Ambulatory Visit: Payer: Self-pay

## 2019-12-18 ENCOUNTER — Ambulatory Visit (HOSPITAL_COMMUNITY): Payer: Medicare Other

## 2019-12-18 DIAGNOSIS — M25632 Stiffness of left wrist, not elsewhere classified: Secondary | ICD-10-CM

## 2019-12-18 DIAGNOSIS — R29898 Other symptoms and signs involving the musculoskeletal system: Secondary | ICD-10-CM

## 2019-12-18 DIAGNOSIS — R278 Other lack of coordination: Secondary | ICD-10-CM

## 2019-12-18 NOTE — Therapy (Signed)
[place], [place], [address] Phone: [phone]   Fax:  [phone]  Occupational Therapy Treatment  Patient Details  Name: Victoria Dominguez MRN: [id number] Date of Birth: 12-04-1950 Referring Provider (OT): Arther Abbott, MD   Encounter Date: 12/18/2019  OT End of Session - 12/18/19 1100    Visit Number  22    Number of Visits  24    Date for OT Re-Evaluation  12/23/19    Authorization Type  medicare    Authorization Time Period  progress note at visit 21    Authorization - Visit Number  22    Authorization - Number of Visits  29    OT Start Time  1032    OT Stop Time  1110    OT Time Calculation (min)  38 min    Activity Tolerance  Patient tolerated treatment well    Behavior During Therapy  WFL for tasks assessed/performed       Past Medical History:  Diagnosis Date  . Hypertension     Past Surgical History:  Procedure Laterality Date  . ABDOMINAL HYSTERECTOMY    . ORIF WRIST FRACTURE Left 09/05/2019   Procedure: OPEN REDUCTION INTERNAL FIXATION (ORIF) WRIST FRACTURE;  Surgeon: Carole Civil, MD;  Location: [facility];  Service: Orthopedics;  Laterality: Left;    There were no vitals filed for this visit.  Subjective Assessment - 12/18/19 1040    Subjective   S: It's just tender where the plate is but that's it.    Currently in Pain?  No/denies         [facility] OT Assessment - 12/18/19 1041      Assessment   Medical Diagnosis  S/P ORIF left wrist      Precautions   Precautions  Other (comment)    Precaution Comments  No lifting over 5lbs               OT Treatments/Exercises (OP) - 12/18/19 1041      Exercises   Exercises  Elbow;Wrist;Hand;Theraputty      Additional Elbow Exercises   Theraputty - Flatten  red -standing focusing finger extension and wrist extension    Theraputty - Roll  red- seated focus on digit flexion and hand strength.     Theraputty - Grip  red      Weighted Stretch Over Towel Roll   Supination - Weighted Stretch  3 pounds;30 seconds   2 sets   Wrist Flexion - Weighted Stretch  3 pounds;30 seconds   2 sets   Wrist Extension - Weighted Stretch  3 pounds;30 seconds   2 sets     Theraputty   Theraputty - Pinch  red: lateral     Theraputty Hand- Locate Pegs  red- 6/6 beads located      Modalities   Modalities  Moist Heat;Paraffin      Moist Heat Therapy   Number Minutes Moist Heat  5 Minutes    Moist Heat Location  Hand;Wrist      LUE Paraffin   Number Minutes Paraffin  5 Minutes    LUE Paraffin Location  Hand;Wrist    Comments  To decrease stiffness               OT Short Term Goals - 10/14/19 1357      OT SHORT TERM GOAL #1   Title  Patient will be educated and independent with HEP in order to faciliate progress in therapy  and allow her to return to using her Left UE as her non dominant extremity for 75% or more of daily tasks.    Time  3    Period  Weeks    Status  On-going    Target Date  10/30/19      OT SHORT TERM GOAL #2   Title  Patient will increase P/ROM to [facility] in order to begin using her left hand as an active assist with basic tasks such as turn the lightswitch on and off.    Time  3    Period  Weeks    Status  On-going        OT Long Term Goals - 10/14/19 1049      OT LONG TERM GOAL #1   Title  Patient will increase Left wrist and hand A/ROM to [facility] in order to be able to stabilize containers and jars when attempting to open them.    Time  6    Period  Weeks    Status  On-going      OT LONG TERM GOAL #2   Title  Patient will increase her grip strength by 15# and her pinch strength by 6# in order to return to holding onto items without the needing to drop them.    Time  6    Period  Weeks    Status  On-going      OT LONG TERM GOAL #3   Title  Patient will increase her left hand coordination by completing the 9 hole peg test in 20 seconds or less.    Time  6    Period  Weeks    Status   On-going      OT LONG TERM GOAL #4   Title  Patient will increase her left wrist strength to 4/5 in order to pick up and carry lightweight household items with less items.    Time  6    Period  Weeks    Status  On-going      OT LONG TERM GOAL #5   Title  Patient will decrease fascial restrictions to min amount or less in order to increase the functional mobility needed to complete functional tasks such as grasping a doorknob to open.    Time  6    Period  Weeks    Status  On-going            Plan - 12/18/19 1135    Clinical Impression Statement  A: Focused on stretching and increasing wrist and finger mobility during weighted stretches and theraputty activities. Rest breaks were taken as needed due to discomfort. VC for form and technique as needed.Contnues to have limited ROM and mobility which is restricting her ability to use her left hand for all daily tasks.    Body Structure / Function / Physical Skills  ADL;UE functional use;Fascial restriction;Pain;FMC;ROM;GMC;Coordination;Decreased knowledge of precautions;Decreased knowledge of use of DME;Edema;Strength;IADL    Plan  P: Continue with increasing hand mobility while incorporating strengthening as able.    Consulted and Agree with Plan of Care  Patient       Patient will benefit from skilled therapeutic intervention in order to improve the following deficits and impairments:   Body Structure / Function / Physical Skills: ADL, UE functional use, Fascial restriction, Pain, FMC, ROM, GMC, Coordination, Decreased knowledge of precautions, Decreased knowledge of use of DME, Edema, Strength, IADL       Visit Diagnosis: Other lack of coordination  Stiffness  of left wrist, not elsewhere classified  Other symptoms and signs involving the musculoskeletal system    Problem List Patient Active Problem List   Diagnosis Date Noted  . Fracture of left wrist with routine healing 10/04/2019  . Fracture, Colles, left, closed  09/09/2019  . S/P ORIF (open reduction internal fixation) fracture 09/05/2019 left wrist  09/09/2019  . Left wrist fracture   . Hypertension 09/02/2019   Ailene Ravel, OTR/L,CBIS  [phone]  12/18/2019, 11:40 AM  [place] [address] [place], [place], [address] Phone: [phone]   Fax:  [phone]  Name: Karl Riggin MRN: [id number] Date of Birth: March 13, 1951

## 2019-12-19 NOTE — Telephone Encounter (Signed)
The hand center has denied referral (Dr Dominica Severin and Dr Leanora Cover) they would like for you to refer to Pain clinic for treatment of CRPS / they use a clinic in [place] / pain clinic in [facility] to send referral there?   Dr Lupe Carney or Cannon Kettle phone number [phone]

## 2019-12-23 ENCOUNTER — Other Ambulatory Visit: Payer: Self-pay

## 2019-12-23 ENCOUNTER — Encounter (HOSPITAL_COMMUNITY): Payer: Self-pay

## 2019-12-23 ENCOUNTER — Other Ambulatory Visit: Payer: Self-pay | Admitting: Orthopedic Surgery

## 2019-12-23 ENCOUNTER — Ambulatory Visit (HOSPITAL_COMMUNITY): Payer: Medicare Other | Attending: Orthopedic Surgery

## 2019-12-23 DIAGNOSIS — R29898 Other symptoms and signs involving the musculoskeletal system: Secondary | ICD-10-CM | POA: Insufficient documentation

## 2019-12-23 DIAGNOSIS — R278 Other lack of coordination: Secondary | ICD-10-CM | POA: Insufficient documentation

## 2019-12-23 DIAGNOSIS — M25632 Stiffness of left wrist, not elsewhere classified: Secondary | ICD-10-CM | POA: Insufficient documentation

## 2019-12-23 DIAGNOSIS — M25532 Pain in left wrist: Secondary | ICD-10-CM

## 2019-12-23 NOTE — Patient Instructions (Signed)
Complete the following stretches 2-3 times a day. Hold for 20-23 seconds. Complete 1-2 sets.   WRIST FLEXOR STRETCH  Use your unaffected hand to bend the affected wrist up as shown.   Keep the elbow straight on the affected side the entire time.       WRIST EXTENSOR STRETCH  Use your unaffected hand to bend the affected wrist down as shown.   Keep the elbow straight on the affected side the entire time.      WRIST EXTENSION STRETCH - TABLE  Place boths hand on a table as shown and gently lean forward until a stretch is felt.       PRAYER STRETCH - WRIST  Place the palms of your hands together with your fingers pointed upwards. Then lower your hands in front of your chest as shown to stretch your wrists.         WRIST EXTENSION STRETCH - FREE WEIGHT 3# - hold for 30 seconds. 2 sets or 1 minute for 1 set  Start by holding a small free weight with the weight and your arm rested on a table.   Next, move your arm so that your wrist is over the edge of the table with palm pointed upward.   Then, let your wrist bend back and downwards towards the floor as shown.   Allow gravity to stretch your wrist as this position is maintained.  Return to starting position with your arm and the weight resting on the table and then repeat.     WRIST FLEXION STRETCH - FREE WEIGHT 3# - hold for 30 seconds. 2 sets or 1 minute for 1 set  Start by holding a small free weight with the weight and your arm rested on a table.   Next, move your arm so that your wrist is over the edge of the table with palm pointed downward.  Then, let your wrist bend downwards towards the floor as shown.   Allow gravity to stretch your wrist as this position is maintained.  Return to starting position with your arm and the weight resting on the table and then repeat.

## 2019-12-23 NOTE — Therapy (Signed)
[facility] [address] [place], [place], [address] Phone: [phone]   Fax:  [phone]  Occupational Therapy Treatment Reassessment  Patient Details  Name: Victoria Dominguez MRN: [id number] Date of Birth: 1951-03-07 Referring Provider (OT): Arther Abbott, MD   Encounter Date: 12/23/2019  OT End of Session - 12/23/19 1433    Visit Number  23    Number of Visits  24    Authorization Type  medicare    Authorization Time Period  progress note at visit 80    Authorization - Visit Number  23    Authorization - Number of Visits  29    OT Start Time  1115    OT Stop Time  1153    OT Time Calculation (min)  38 min    Activity Tolerance  Patient tolerated treatment well    Behavior During Therapy  WFL for tasks assessed/performed       Past Medical History:  Diagnosis Date  . Hypertension     Past Surgical History:  Procedure Laterality Date  . ABDOMINAL HYSTERECTOMY    . ORIF WRIST FRACTURE Left 09/05/2019   Procedure: OPEN REDUCTION INTERNAL FIXATION (ORIF) WRIST FRACTURE;  Surgeon: Carole Civil, MD;  Location: [facility];  Service: Orthopedics;  Laterality: Left;    There were no vitals filed for this visit.  Subjective Assessment - 12/23/19 1121    Subjective   S: I just I knew what was wrong with my hand and why I can't close my hand.    Currently in Pain?  No/denies         [facility] OT Assessment - 12/23/19 1121      Assessment   Medical Diagnosis  S/P ORIF left wrist    Referring Provider (OT)  Arther Abbott, MD    Onset Date/Surgical Date  09/05/19      Precautions   Precautions  Other (comment)    Precaution Comments  No lifting over 5lbs    Required Braces or Orthoses  Other Brace/Splint      Prior Function   Level of Independence  Independent      Coordination   9 Hole Peg Test  Left    Left 9 Hole Peg Test  17.4"   previous: 29.7"     ROM / Strength   AROM / PROM / Strength  Strength      AROM    Left Forearm Supination  62 Degrees   previous: 58   Left Wrist Extension  52 Degrees   previous: 30   Left Wrist Flexion  50 Degrees   previous: 44   Left Wrist Radial Deviation  30 Degrees   previous: 12   Left Wrist Ulnar Deviation  26 Degrees   previous; same     Strength   Left Forearm Pronation  4-/5   previous: 3/5   Left Forearm Supination  4-/5   previous: 3/5   Left Wrist Flexion  3-/5   previous: same   Left Wrist Extension  3+/5   previous: 3-/5   Left Wrist Radial Deviation  5/5   previous: 3-/5   Left Wrist Ulnar Deviation  5/5   previous: 3-/5   Left Hand Grip (lbs)  20   previous: 7   Left Hand Lateral Pinch  11 lbs   previous: 7   Left Hand 3 Point Pinch  8 lbs   previous: 5     Left Hand AROM   L  Index  MCP 0-90  58 Degrees   20   L Index PIP 0-100  8 Degrees    L Index DIP 0-70  44 Degrees    L Long  MCP 0-90  58 Degrees   18   L Long PIP 0-100  10 Degrees    L Long DIP 0-70  46 Degrees    L Ring  MCP 0-90  64 Degrees   30   L Ring PIP 0-100  28 Degrees    L Ring DIP 0-70  46 Degrees    L Little  MCP 0-90  46 Degrees   0   L Little PIP 0-100  10 Degrees    L Little DIP 0-70  38 Degrees         Quick Dash - 12/23/19 0001    Open a tight or new jar  Mild difficulty    Do heavy household chores (wash walls, wash floors)  Moderate difficulty    Carry a shopping bag or briefcase  No difficulty    Wash your back  No difficulty    Use a knife to cut food  Mild difficulty    Recreational activities in which you take some force or impact through your arm, shoulder, or hand (golf, hammering, tennis)  Unable    During the past week, to what extent has your arm, shoulder or hand problem interfered with your normal social activities with family, friends, neighbors, or groups?  Not at all    During the past week, to what extent has your arm, shoulder or hand problem limited your work or other regular daily activities  Not at all    Arm, shoulder, or  hand pain.  Mild    Tingling (pins and needles) in your arm, shoulder, or hand  Mild    Difficulty Sleeping  No difficulty   not related to wrist   DASH Score  22.73 %           OT Treatments/Exercises (OP) - 12/23/19 1331      Modalities   Modalities  Moist Heat;Paraffin      Moist Heat Therapy   Number Minutes Moist Heat  5 Minutes    Moist Heat Location  Hand;Wrist      LUE Paraffin   Number Minutes Paraffin  5 Minutes    LUE Paraffin Location  Hand;Wrist    Comments  To decrease stiffness             OT Education - 12/23/19 1432    Education Details  Reviewed progress in therapy, reviewed therapy goals. Updated HEP with wrist stretches.    Person(s) Educated  Patient    Methods  Explanation;Demonstration;Handout    Comprehension  Verbalized understanding       OT Short Term Goals - 12/23/19 1142      OT SHORT TERM GOAL #1   Title  Patient will be educated and independent with HEP in order to faciliate progress in therapy and allow her to return to using her Left UE as her non dominant extremity for 75% or more of daily tasks.    Time  3    Period  Weeks    Status  Achieved    Target Date  10/30/19      OT SHORT TERM GOAL #2   Title  Patient will increase P/ROM to [facility] in order to begin using her left hand as an active assist with basic tasks such as turn the  lightswitch on and off.    Time  3    Period  Weeks    Status  Not Met        OT Long Term Goals - 12/23/19 1142      OT LONG TERM GOAL #1   Title  Patient will increase Left wrist and hand A/ROM to [facility] in order to be able to stabilize containers and jars when attempting to open them.    Time  6    Period  Weeks    Status  Partially Met      OT LONG TERM GOAL #2   Title  Patient will increase her grip strength by 15# and her pinch strength by 6# in order to return to holding onto items without the needing to drop them.    Time  6    Period  Weeks    Status  Partially Met      OT LONG  TERM GOAL #3   Title  Patient will increase her left hand coordination by completing the 9 hole peg test in 20 seconds or less.    Time  6    Period  Weeks    Status  Achieved      OT LONG TERM GOAL #4   Title  Patient will increase her left wrist strength to 4/5 in order to pick up and carry lightweight household items with less items.    Time  6    Period  Weeks    Status  Achieved      OT LONG TERM GOAL #5   Title  Patient will decrease fascial restrictions to min amount or less in order to increase the functional mobility needed to complete functional tasks such as grasping a doorknob to open.    Time  6    Period  Weeks    Status  Partially Met            Plan - 12/23/19 1434    Clinical Impression Statement  A: Reassessment completed this date. Patient has met 1/2 short term goals and 2/5 long term goals. Patient does show improvement with grip and pinch strength, A/ROM has made improvement as well. Although improvements are noted during reassessment, patient continues to be limited to approximately 50-60% full use of her left hand. She is unable to form a full fist. She is significantly limited with joint mobility in her MCP joints. Session has been centered around stretching and increasing joint mobility as much as possible. At this point, therapy has completed all that is possible with the achieving more range of motion and functional use. Patient has been diligent with her HEP and is independent with her exercises. Pt has been referred to a hand specialist in [place] for further management although he was unable to see her. Patient reports that she may be seeing a doctor in [place]. At this time, it's recommended that patient discharge from therapy and continue with her HEP while waiting to follow up with a hand specialist for further treatment. Patient is in agreement with recommendation.    Body Structure / Function / Physical Skills  ADL;UE functional use;Fascial  restriction;Pain;FMC;ROM;GMC;Coordination;Decreased knowledge of precautions;Decreased knowledge of use of DME;Edema;Strength;IADL    Plan  P: Discharge from OT services with HEP. See hand specialist to assess her hand and recommend next step of treatment.    Consulted and Agree with Plan of Care  Patient       Patient will benefit from skilled  therapeutic intervention in order to improve the following deficits and impairments:   Body Structure / Function / Physical Skills: ADL, UE functional use, Fascial restriction, Pain, FMC, ROM, GMC, Coordination, Decreased knowledge of precautions, Decreased knowledge of use of DME, Edema, Strength, IADL       Visit Diagnosis: Stiffness of left wrist, not elsewhere classified  Other lack of coordination  Other symptoms and signs involving the musculoskeletal system  Pain in left wrist    Problem List Patient Active Problem List   Diagnosis Date Noted  . Fracture of left wrist with routine healing 10/04/2019  . Fracture, Colles, left, closed 09/09/2019  . S/P ORIF (open reduction internal fixation) fracture 09/05/2019 left wrist  09/09/2019  . Left wrist fracture   . Hypertension 09/02/2019   OCCUPATIONAL THERAPY DISCHARGE SUMMARY  Visits from Start of Care: 23  Current functional level related to goals / functional outcomes: See above   Remaining deficits: See above   Education / Equipment: See above Plan: Patient agrees to discharge.  Patient goals were partially met. Patient is being discharged due to                                                     ?????reaching maximum potential for rehab services.            Ailene Ravel, OTR/L,CBIS  [phone]  12/23/2019, 2:57 PM  [facility] [address] [place], [place], [address] Phone: [phone]   Fax:  [phone]  Name: Victoria Dominguez MRN: [id number] Date of Birth: 1950/12/19

## 2019-12-25 ENCOUNTER — Ambulatory Visit (HOSPITAL_COMMUNITY): Payer: Medicare Other

## 2020-01-06 ENCOUNTER — Ambulatory Visit (INDEPENDENT_AMBULATORY_CARE_PROVIDER_SITE_OTHER): Payer: Medicare Other | Admitting: Orthopedic Surgery

## 2020-01-06 ENCOUNTER — Encounter: Payer: Self-pay | Admitting: Orthopedic Surgery

## 2020-01-06 ENCOUNTER — Other Ambulatory Visit: Payer: Self-pay

## 2020-01-06 VITALS — BP 143/87 | HR 103 | Ht 65.5 in | Wt 133.0 lb

## 2020-01-06 DIAGNOSIS — Z9889 Other specified postprocedural states: Secondary | ICD-10-CM

## 2020-01-06 DIAGNOSIS — S52539S Colles' fracture of unspecified radius, sequela: Secondary | ICD-10-CM

## 2020-01-06 DIAGNOSIS — Z8781 Personal history of (healed) traumatic fracture: Secondary | ICD-10-CM | POA: Diagnosis not present

## 2020-01-06 DIAGNOSIS — M810 Age-related osteoporosis without current pathological fracture: Secondary | ICD-10-CM | POA: Insufficient documentation

## 2020-01-06 NOTE — Progress Notes (Signed)
Chief Complaint  Patient presents with  . Post-op Follow-up    Left wrist open reduction internal fixation volar plating complains of left wrist pain and stiffness in the hand   Open reduction internal fixation September 05, 2019 fracture healed patient did not regain range of motion  We have made several attempts at referral to a hand specialist and were unsuccessful and then the patient declined attempts at referring.  Main problem is she cannot flex her fingers all the way down to make a full fist she has some discomfort in the MP joints of the hand  BP (!) 143/87   Pulse (!) 103   Ht 5' 5.5" (1.664 m)   Wt 133 lb (60.3 kg)   BMI 21.80 kg/m   The surgical scar of the left wrist has healed nicely there is a fullness around the distal radial crease she can flex the fingers down but incompletely and she cannot fully extend at the MP joints  The fracture site is nontender  There is no numbness or tingling color and capillary refill are normal  We have referred her to someone now in [place] they will see her in March started I will see her on the 12th or 13th  Encounter Diagnoses  Name Primary?  . S/P ORIF (open reduction internal fixation) fracture Yes  . Closed Colles' fracture, sequela

## 2020-01-14 ENCOUNTER — Telehealth: Payer: Self-pay | Admitting: Radiology

## 2020-01-14 NOTE — Telephone Encounter (Signed)
Letter received from [place] patient has not responded to the calls from company to get her in a JAS wrist brace  To you FYI

## 2020-03-04 ENCOUNTER — Ambulatory Visit: Payer: Medicare Other | Admitting: Orthopedic Surgery

## 2020-03-11 ENCOUNTER — Other Ambulatory Visit (HOSPITAL_COMMUNITY): Payer: Self-pay | Admitting: Hand Surgery

## 2020-03-11 ENCOUNTER — Other Ambulatory Visit: Payer: Self-pay | Admitting: Hand Surgery

## 2020-03-11 DIAGNOSIS — S46012A Strain of muscle(s) and tendon(s) of the rotator cuff of left shoulder, initial encounter: Secondary | ICD-10-CM

## 2020-03-25 ENCOUNTER — Ambulatory Visit
Admission: RE | Admit: 2020-03-25 | Discharge: 2020-03-25 | Disposition: A | Discharge status: Home / Self Care | Payer: Medicare Other | Source: Ambulatory Visit | Attending: Hand Surgery | Admitting: Hand Surgery

## 2020-03-25 ENCOUNTER — Other Ambulatory Visit: Payer: Self-pay

## 2020-03-25 DIAGNOSIS — S46012A Strain of muscle(s) and tendon(s) of the rotator cuff of left shoulder, initial encounter: Secondary | ICD-10-CM

## 2020-06-29 ENCOUNTER — Other Ambulatory Visit: Payer: Self-pay | Admitting: Family Medicine

## 2020-06-29 DIAGNOSIS — Z1231 Encounter for screening mammogram for malignant neoplasm of breast: Secondary | ICD-10-CM

## 2020-07-02 ENCOUNTER — Other Ambulatory Visit: Payer: Self-pay | Admitting: Family Medicine

## 2020-07-02 DIAGNOSIS — R519 Headache, unspecified: Secondary | ICD-10-CM

## 2020-07-02 DIAGNOSIS — R42 Dizziness and giddiness: Secondary | ICD-10-CM

## 2020-07-02 DIAGNOSIS — Z86011 Personal history of benign neoplasm of the brain: Secondary | ICD-10-CM

## 2020-07-21 ENCOUNTER — Ambulatory Visit: Payer: Medicare Other

## 2020-08-10 ENCOUNTER — Other Ambulatory Visit: Payer: Self-pay

## 2020-08-10 ENCOUNTER — Ambulatory Visit (INDEPENDENT_AMBULATORY_CARE_PROVIDER_SITE_OTHER): Payer: Medicare Other | Admitting: Dermatology

## 2020-08-10 DIAGNOSIS — L820 Inflamed seborrheic keratosis: Secondary | ICD-10-CM

## 2020-08-10 DIAGNOSIS — L578 Other skin changes due to chronic exposure to nonionizing radiation: Secondary | ICD-10-CM

## 2020-08-10 DIAGNOSIS — D229 Melanocytic nevi, unspecified: Secondary | ICD-10-CM

## 2020-08-10 DIAGNOSIS — L570 Actinic keratosis: Secondary | ICD-10-CM

## 2020-08-10 DIAGNOSIS — Z1283 Encounter for screening for malignant neoplasm of skin: Secondary | ICD-10-CM

## 2020-08-10 DIAGNOSIS — D692 Other nonthrombocytopenic purpura: Secondary | ICD-10-CM

## 2020-08-10 DIAGNOSIS — L814 Other melanin hyperpigmentation: Secondary | ICD-10-CM

## 2020-08-10 DIAGNOSIS — D1800 Hemangioma unspecified site: Secondary | ICD-10-CM

## 2020-08-10 DIAGNOSIS — L821 Other seborrheic keratosis: Secondary | ICD-10-CM

## 2020-08-10 NOTE — Patient Instructions (Addendum)

## 2020-08-10 NOTE — Progress Notes (Signed)
Follow-Up Visit   Subjective  Victoria Dominguez is a 69 y.o. female who presents for the following: TBSE. The patient presents for Total-Body Skin Exam (TBSE) for skin cancer screening and mole check. Patient presents today for full body skin exam and skin cancer screening. Patient does have a few areas of concern on her Left lower leg and bilateral cheeks that are scaly. Patient has no h/o skin cancers  The following portions of the chart were reviewed this encounter and updated as appropriate:  Tobacco  Allergies  Meds  Problems  Med Hx  Surg Hx  Fam Hx     Review of Systems:  No other skin or systemic complaints except as noted in HPI or Assessment and Plan.  Objective  Well appearing patient in no apparent distress; mood and affect are within normal limits.  A full examination was performed including scalp, head, eyes, ears, nose, lips, neck, chest, axillae, abdomen, back, buttocks, bilateral upper extremities, bilateral lower extremities, hands, feet, fingers, toes, fingernails, and toenails. All findings within normal limits unless otherwise noted below.  Objective  Face x 16 (16): Erythematous thin papules/macules with gritty scale.   Objective  Left Medial Calf: Erythematous keratotic or waxy stuck-on papule or plaque.    Assessment & Plan  AK (actinic keratosis) (16) Face x 16  Cryotherapy today Prior to procedure, discussed risks of blister formation, small wound, skin dyspigmentation, or rare scar following cryotherapy.    Destruction of lesion - Face x 16 Complexity: simple   Destruction method: cryotherapy   Informed consent: discussed and consent obtained   Timeout:  patient name, date of birth, surgical site, and procedure verified Lesion destroyed using liquid nitrogen: Yes   Region frozen until ice ball extended beyond lesion: Yes   Outcome: patient tolerated procedure well with no complications   Post-procedure details: wound care instructions given     Inflamed seborrheic keratosis Left Medial Calf  Cryotherapy today Prior to procedure, discussed risks of blister formation, small wound, skin dyspigmentation, or rare scar following cryotherapy.    Destruction of lesion - Left Medial Calf Complexity: simple   Destruction method: cryotherapy   Informed consent: discussed and consent obtained   Timeout:  patient name, date of birth, surgical site, and procedure verified Lesion destroyed using liquid nitrogen: Yes   Region frozen until ice ball extended beyond lesion: Yes   Outcome: patient tolerated procedure well with no complications   Post-procedure details: wound care instructions given    Skin cancer screening   Lentigines - Scattered tan macules - Discussed due to sun exposure - Benign, observe - Call for any changes  Seborrheic Keratoses - Stuck-on, waxy, tan-brown papules and plaques  - Discussed benign etiology and prognosis. - Observe - Call for any changes  Melanocytic Nevi - Tan-brown and/or pink-flesh-colored symmetric macules and papules - Benign appearing on exam today - Observation - Call clinic for new or changing moles - Recommend daily use of broad spectrum spf 30+ sunscreen to sun-exposed areas.   Hemangiomas - Red papules - Discussed benign nature - Observe - Call for any changes  Actinic Damage - diffuse scaly erythematous macules with underlying dyspigmentation - Recommend daily broad spectrum sunscreen SPF 30+ to sun-exposed areas, reapply every 2 hours as needed.  - Call for new or changing lesions.  Purpura - Violaceous macules and patches - Benign - Related to age, sun damage and/or use of blood thinners - Observe - Can use OTC arnica containing moisturizer such as  Dermend Bruise Formula if desired - Call for worsening or other concerns  Skin cancer screening performed today.  Return in about 6 weeks (around 09/21/2020) for ISK / AK Follow up.  Marene Lenz, CMA, am acting  as scribe for Sarina Ser, MD . Documentation: I have reviewed the above documentation for accuracy and completeness, and I agree with the above.  Sarina Ser, MD

## 2020-08-11 ENCOUNTER — Encounter: Payer: Self-pay | Admitting: Dermatology

## 2020-08-13 ENCOUNTER — Other Ambulatory Visit: Payer: Self-pay

## 2020-08-13 ENCOUNTER — Ambulatory Visit
Admission: RE | Admit: 2020-08-13 | Discharge: 2020-08-13 | Disposition: A | Discharge status: Home / Self Care | Payer: Medicare Other | Source: Ambulatory Visit | Attending: Family Medicine | Admitting: Family Medicine

## 2020-08-13 DIAGNOSIS — Z1231 Encounter for screening mammogram for malignant neoplasm of breast: Secondary | ICD-10-CM | POA: Insufficient documentation

## 2020-08-24 ENCOUNTER — Ambulatory Visit
Admission: RE | Admit: 2020-08-24 | Discharge: 2020-08-24 | Disposition: A | Discharge status: Home / Self Care | Payer: Medicare Other | Source: Ambulatory Visit | Attending: Family Medicine | Admitting: Family Medicine

## 2020-08-24 ENCOUNTER — Other Ambulatory Visit: Payer: Self-pay

## 2020-08-24 DIAGNOSIS — R42 Dizziness and giddiness: Secondary | ICD-10-CM | POA: Insufficient documentation

## 2020-08-24 DIAGNOSIS — Z86011 Personal history of benign neoplasm of the brain: Secondary | ICD-10-CM | POA: Diagnosis present

## 2020-08-24 DIAGNOSIS — R519 Headache, unspecified: Secondary | ICD-10-CM | POA: Diagnosis present

## 2020-08-24 MED ORDER — GADOBUTROL 1 MMOL/ML IV SOLN
6.0000 mL | Freq: Once | INTRAVENOUS | Status: AC | PRN
  Administered 2020-08-24: 6 mL via INTRAVENOUS

## 2020-09-23 ENCOUNTER — Ambulatory Visit (INDEPENDENT_AMBULATORY_CARE_PROVIDER_SITE_OTHER): Payer: Medicare Other | Admitting: Dermatology

## 2020-09-23 ENCOUNTER — Encounter: Payer: Self-pay | Admitting: Dermatology

## 2020-09-23 ENCOUNTER — Other Ambulatory Visit: Payer: Self-pay

## 2020-09-23 DIAGNOSIS — L570 Actinic keratosis: Secondary | ICD-10-CM | POA: Diagnosis not present

## 2020-09-23 DIAGNOSIS — L820 Inflamed seborrheic keratosis: Secondary | ICD-10-CM | POA: Diagnosis not present

## 2020-09-23 DIAGNOSIS — L578 Other skin changes due to chronic exposure to nonionizing radiation: Secondary | ICD-10-CM | POA: Diagnosis not present

## 2020-09-23 NOTE — Progress Notes (Signed)
Follow-Up Visit   Subjective  Victoria Dominguez is a 69 y.o. female who presents for the following: Actinic Keratosis (Follow of face treated with LN2 x 16) and ISK (L med calf 6m f/u).   The following portions of the chart were reviewed this encounter and updated as appropriate:  Tobacco  Allergies  Meds  Problems  Med Hx  Surg Hx  Fam Hx     Review of Systems:  No other skin or systemic complaints except as noted in HPI or Assessment and Plan.  Objective  Well appearing patient in no apparent distress; mood and affect are within normal limits.  A focused examination was performed including face, L calf. Relevant physical exam findings are noted in the Assessment and Plan.  Objective  face x 16 (16): Pink scaly macules   Objective  face x 3 (3): Erythematous keratotic or waxy stuck-on papule or plaque.    Assessment & Plan    Actinic Damage - chronic, secondary to cumulative UV radiation exposure/sun exposure over time - diffuse scaly erythematous macules with underlying dyspigmentation - Recommend daily broad spectrum sunscreen SPF 30+ to sun-exposed areas, reapply every 2 hours as needed.  - Call for new or changing lesions. - Severe, chronic - Discussed "Field Treatment" for Severe, Confluent Actinic Changes with Pre-Cancerous Actinic Keratoses due to cumulative sun exposure/UV radiation exposure over time Field treatment involves treatment of an entire area of skin that has confluent Actinic Changes (Sun/ Ultraviolet light damage) and PreCancerous Actinic Keratoses by method of PhotoDynamic Therapy (PDT) and/or prescription Topical Chemotherapy agents such as 5-fluorouracil, 5-fluorouracil/calcipotriene, and/or imiquimod.  The purpose is to decrease the number of clinically evident and subclinical PreCancerous lesions to prevent progression to development of skin cancer by chemically destroying early precancer changes that may or may not be visible.  It has been shown to  reduce the risk of developing skin cancer in the treated area. As a result of treatment, redness, scaling, crusting, and open sores may occur during treatment course. One or more than one of these methods may be used and may have to be used several times to control, suppress and eliminate the PreCancerous changes. Discussed treatment course, expected reaction, and possible side effects.   AK (actinic keratosis) (16) face x 16  Recommend PDT with ALA in next 69months  Destruction of lesion - face x 16 Complexity: simple   Destruction method: cryotherapy   Informed consent: discussed and consent obtained   Timeout:  patient name, date of birth, surgical site, and procedure verified Lesion destroyed using liquid nitrogen: Yes   Region frozen until ice ball extended beyond lesion: Yes   Outcome: patient tolerated procedure well with no complications   Post-procedure details: wound care instructions given    Inflamed seborrheic keratosis (3) face x 3  Destruction of lesion - face x 3 Complexity: simple   Destruction method: cryotherapy   Informed consent: discussed and consent obtained   Timeout:  patient name, date of birth, surgical site, and procedure verified Lesion destroyed using liquid nitrogen: Yes   Region frozen until ice ball extended beyond lesion: Yes   Outcome: patient tolerated procedure well with no complications   Post-procedure details: wound care instructions given    Return in about 1 month (around 10/23/2020) for PDT with ALA face, then March for f/u AK with Dr. Dara Lords, Othelia Pulling, RMA, am acting as scribe for Sarina Ser, MD .  Documentation: I have reviewed the above documentation for accuracy  and completeness, and I agree with the above.  Sarina Ser, MD

## 2020-10-29 ENCOUNTER — Ambulatory Visit: Payer: Medicare Other

## 2020-12-09 ENCOUNTER — Ambulatory Visit: Payer: Medicare Other

## 2020-12-24 ENCOUNTER — Ambulatory Visit (INDEPENDENT_AMBULATORY_CARE_PROVIDER_SITE_OTHER): Payer: Medicare Other

## 2020-12-24 ENCOUNTER — Other Ambulatory Visit: Payer: Self-pay

## 2020-12-24 DIAGNOSIS — L570 Actinic keratosis: Secondary | ICD-10-CM | POA: Diagnosis not present

## 2020-12-24 MED ORDER — AMINOLEVULINIC ACID HCL 20 % EX SOLR
1.0000 "application " | Freq: Once | CUTANEOUS | Status: AC
  Administered 2020-12-24: 354 mg via TOPICAL

## 2020-12-24 NOTE — Patient Instructions (Signed)

## 2020-12-24 NOTE — Progress Notes (Signed)
Patient completed PDT therapy today.  1. AK (actinic keratosis) Head - Anterior (Face)  Photodynamic therapy - Head - Anterior (Face) Procedure discussed: discussed risks, benefits, side effects. and alternatives   Prep: site scrubbed/prepped with acetone   Location:  Face Number of lesions:  Multiple Type of treatment:  Blue light Aminolevulinic Acid (see MAR for details): Levulan Number of Levulan sticks used:  1 Incubation time (minutes):  60 Number of minutes under lamp:  16 Number of seconds under lamp:  40 Cooling:  Floor fan Post-procedure details: sunscreen applied    Aminolevulinic Acid HCl 20 % SOLR 354 mg - Head - Anterior (Face)   

## 2021-02-04 ENCOUNTER — Ambulatory Visit: Payer: Medicare Other | Admitting: Dermatology

## 2021-02-18 ENCOUNTER — Other Ambulatory Visit: Payer: Self-pay

## 2021-02-18 ENCOUNTER — Encounter: Payer: Self-pay | Admitting: Ophthalmology

## 2021-02-26 ENCOUNTER — Other Ambulatory Visit: Admission: RE | Admit: 2021-02-26 | Payer: Medicare Other | Source: Ambulatory Visit

## 2021-03-02 ENCOUNTER — Ambulatory Visit
Admission: RE | Admit: 2021-03-02 | Discharge: 2021-03-02 | Disposition: A | Discharge status: Home / Self Care | Payer: Medicare Other | Attending: Ophthalmology | Admitting: Ophthalmology

## 2021-03-02 ENCOUNTER — Encounter: Payer: Self-pay | Admitting: Ophthalmology

## 2021-03-02 ENCOUNTER — Ambulatory Visit: Payer: Medicare Other | Admitting: Anesthesiology

## 2021-03-02 ENCOUNTER — Other Ambulatory Visit: Payer: Self-pay

## 2021-03-02 ENCOUNTER — Encounter
Admission: RE | Disposition: A | Discharge status: Home / Self Care | Payer: Self-pay | Source: Home / Self Care | Attending: Ophthalmology

## 2021-03-02 DIAGNOSIS — Z79899 Other long term (current) drug therapy: Secondary | ICD-10-CM | POA: Insufficient documentation

## 2021-03-02 DIAGNOSIS — H2511 Age-related nuclear cataract, right eye: Secondary | ICD-10-CM | POA: Diagnosis present

## 2021-03-02 HISTORY — PX: CATARACT EXTRACTION W/PHACO: SHX586

## 2021-03-02 SURGERY — PHACOEMULSIFICATION, CATARACT, WITH IOL INSERTION
Anesthesia: Monitor Anesthesia Care | Site: Eye | Laterality: Right

## 2021-03-02 MED ORDER — LIDOCAINE HCL (PF) 2 % IJ SOLN
INTRAOCULAR | Status: DC | PRN
  Administered 2021-03-02: 2 mL

## 2021-03-02 MED ORDER — TETRACAINE HCL 0.5 % OP SOLN
1.0000 [drp] | OPHTHALMIC | Status: DC | PRN
  Administered 2021-03-02 (×3): 1 [drp] via OPHTHALMIC

## 2021-03-02 MED ORDER — ACETAMINOPHEN 325 MG PO TABS: 325.0000 mg | ORAL_TABLET | Freq: Once | ORAL | Status: DC

## 2021-03-02 MED ORDER — MOXIFLOXACIN HCL 0.5 % OP SOLN
OPHTHALMIC | Status: DC | PRN
  Administered 2021-03-02: 0.2 mL via OPHTHALMIC

## 2021-03-02 MED ORDER — EPINEPHRINE PF 1 MG/ML IJ SOLN
INTRAOCULAR | Status: DC | PRN
  Administered 2021-03-02: 49 mL via OPHTHALMIC

## 2021-03-02 MED ORDER — ACETAMINOPHEN 160 MG/5ML PO SOLN: 325.0000 mg | Freq: Once | ORAL | Status: DC

## 2021-03-02 MED ORDER — MIDAZOLAM HCL 2 MG/2ML IJ SOLN
INTRAMUSCULAR | Status: DC | PRN
  Administered 2021-03-02: 1 mg via INTRAVENOUS

## 2021-03-02 MED ORDER — NA CHONDROIT SULF-NA HYALURON 40-17 MG/ML IO SOLN
INTRAOCULAR | Status: DC | PRN
  Administered 2021-03-02: 1 mL via INTRAOCULAR

## 2021-03-02 MED ORDER — ARMC OPHTHALMIC DILATING DROPS
1.0000 "application " | OPHTHALMIC | Status: DC | PRN
  Administered 2021-03-02 (×3): 1 via OPHTHALMIC

## 2021-03-02 MED ORDER — FENTANYL CITRATE (PF) 100 MCG/2ML IJ SOLN
INTRAMUSCULAR | Status: DC | PRN
  Administered 2021-03-02: 50 ug via INTRAVENOUS

## 2021-03-02 MED ORDER — BRIMONIDINE TARTRATE-TIMOLOL 0.2-0.5 % OP SOLN
OPHTHALMIC | Status: DC | PRN
  Administered 2021-03-02: 1 [drp] via OPHTHALMIC

## 2021-03-02 MED ORDER — LACTATED RINGERS IV SOLN: INTRAVENOUS | Status: DC

## 2021-03-02 SURGICAL SUPPLY — 15 items
CANNULA ANT/CHMB 27GA (MISCELLANEOUS) ×4 IMPLANT
GLOVE SURG TRIUMPH 8.0 PF LTX (GLOVE) ×4 IMPLANT
GOWN STRL REUS W/ TWL LRG LVL3 (GOWN DISPOSABLE) ×2 IMPLANT
GOWN STRL REUS W/TWL LRG LVL3 (GOWN DISPOSABLE) ×4
LENS IOL TECNIS EYHANCE 16.5 (Intraocular Lens) ×2 IMPLANT
MARKER SKIN DUAL TIP RULER LAB (MISCELLANEOUS) ×2 IMPLANT
NEEDLE FILTER BLUNT 18X 1/2SAF (NEEDLE) ×1
NEEDLE FILTER BLUNT 18X1 1/2 (NEEDLE) ×1 IMPLANT
PACK EYE AFTER SURG (MISCELLANEOUS) ×2 IMPLANT
PACK OPTHALMIC (MISCELLANEOUS) ×2 IMPLANT
PACK PORFILIO (MISCELLANEOUS) ×2 IMPLANT
SYR 3ML LL SCALE MARK (SYRINGE) ×2 IMPLANT
SYR TB 1ML LUER SLIP (SYRINGE) ×2 IMPLANT
WATER STERILE IRR 250ML POUR (IV SOLUTION) ×2 IMPLANT
WIPE NON LINTING 3.25X3.25 (MISCELLANEOUS) ×2 IMPLANT

## 2021-03-02 NOTE — Discharge Instructions (Signed)

## 2021-03-02 NOTE — Anesthesia Preprocedure Evaluation (Signed)
Anesthesia Evaluation  Patient identified by MRN, date of birth, ID band Patient awake    Reviewed: Allergy & Precautions, H&P , NPO status , Patient's Chart, lab work & pertinent test results  Airway Mallampati: II  TM Distance: >3 FB Neck ROM: full    Dental  (+) Upper Dentures   Pulmonary    Pulmonary exam normal breath sounds clear to auscultation       Cardiovascular hypertension, Normal cardiovascular exam Rhythm:regular Rate:Normal     Neuro/Psych    GI/Hepatic   Endo/Other    Renal/GU      Musculoskeletal   Abdominal   Peds  Hematology   Anesthesia Other Findings   Reproductive/Obstetrics                             Anesthesia Physical Anesthesia Plan  ASA: II  Anesthesia Plan: MAC   Post-op Pain Management:    Induction:   PONV Risk Score and Plan: 2 and Treatment may vary due to age or medical condition, TIVA and Midazolam  Airway Management Planned:   Additional Equipment:   Intra-op Plan:   Post-operative Plan:   Informed Consent: I have reviewed the patients History and Physical, chart, labs and discussed the procedure including the risks, benefits and alternatives for the proposed anesthesia with the patient or authorized representative who has indicated his/her understanding and acceptance.     Dental Advisory Given  Plan Discussed with: CRNA  Anesthesia Plan Comments:         Anesthesia Quick Evaluation

## 2021-03-02 NOTE — Transfer of Care (Signed)
Immediate Anesthesia Transfer of Care Note  Patient: Victoria Dominguez  Procedure(s) Performed: CATARACT EXTRACTION PHACO AND INTRAOCULAR LENS PLACEMENT (IOC) RIGHT 4.23 00:31.5 (Right Eye)  Patient Location: PACU  Anesthesia Type: MAC  Level of Consciousness: awake, alert  and patient cooperative  Airway and Oxygen Therapy: Patient Spontanous Breathing and Patient connected to supplemental oxygen  Post-op Assessment: Post-op Vital signs reviewed, Patient's Cardiovascular Status Stable, Respiratory Function Stable, Patent Airway and No signs of Nausea or vomiting  Post-op Vital Signs: Reviewed and stable  Complications: No complications documented.

## 2021-03-02 NOTE — H&P (Signed)
[facility]   Primary Care Physician:  Maryland Pink, MD Ophthalmologist: Dr. George Ina  Pre-Procedure History & Physical: HPI:  Victoria Dominguez is a 70 y.o. female here for cataract surgery.   Past Medical History:  Diagnosis Date  . Hypertension     Past Surgical History:  Procedure Laterality Date  . ABDOMINAL HYSTERECTOMY    . ORIF WRIST FRACTURE Left 09/05/2019   Procedure: OPEN REDUCTION INTERNAL FIXATION (ORIF) WRIST FRACTURE;  Surgeon: Carole Civil, MD;  Location: [facility];  Service: Orthopedics;  Laterality: Left;    Prior to Admission medications   Medication Sig Start Date End Date Taking? Authorizing Provider  citalopram (CELEXA) 10 MG tablet Take 10 mg by mouth daily.   Yes [provider]  ibandronate (BONIVA) 150 MG tablet Take by mouth. 07/01/20  Yes [provider]  ibuprofen (ADVIL) 800 MG tablet Take 1 tablet (800 mg total) by mouth every 8 (eight) hours as needed. 09/05/19  Yes Carole Civil, MD  lisinopril (ZESTRIL) 10 MG tablet  08/07/19  Yes [provider]  traZODone (DESYREL) 100 MG tablet Take by mouth. 07/01/20 03/02/21 Yes [provider]  cetirizine (ZYRTEC) 10 MG tablet Take by mouth. Patient not taking: Reported on 02/18/2021 10/10/18 10/10/19  [provider]  fluticasone (FLONASE) 50 MCG/ACT nasal spray Place into the nose. Patient not taking: Reported on 03/02/2021 10/10/18 10/10/19  [provider]  omeprazole (PRILOSEC) 20 MG capsule Take by mouth. 05/03/19 05/02/20  [provider]    Allergies as of 02/01/2021  . (No Known Allergies)    Family History  Problem Relation Age of Onset  . Breast cancer Sister 28    Social History   Socioeconomic History  . Marital status: Married    Spouse name: Not on file  . Number of children: Not on file  . Years of education: Not on file  . Highest education level: Not on file  Occupational History  . Not on file  Tobacco  Use  . Smoking status: Never Smoker  . Smokeless tobacco: Never Used  Substance and Sexual Activity  . Alcohol use: Never  . Drug use: Never  . Sexual activity: Not on file  Other Topics Concern  . Not on file  Social History Narrative  . Not on file   Social Determinants of Health   Financial Resource Strain: Not on file  Food Insecurity: Not on file  Transportation Needs: Not on file  Physical Activity: Not on file  Stress: Not on file  Social Connections: Not on file  Intimate Partner Violence: Not on file    Review of Systems: See HPI, otherwise negative ROS  Physical Exam: BP (!) 145/72   Pulse (!) 102   Temp (!) 96.8 F (36 C) (Temporal)   Ht 5\' 5"  (1.651 m)   Wt 55.8 kg   SpO2 100%   BMI 20.47 kg/m  General:   Alert,  pleasant and cooperative in NAD Head:  Normocephalic and atraumatic. Respiratory:  Normal work of breathing. Cardiovascular:  RRR  Impression/Plan: Victoria Dominguez is here for cataract surgery.  Risks, benefits, limitations, and alternatives regarding cataract surgery have been reviewed with the patient.  Questions have been answered.  All parties agreeable.   Birder Robson, MD  03/02/2021, 9:42 AM

## 2021-03-02 NOTE — Op Note (Signed)
PREOPERATIVE DIAGNOSIS:  Nuclear sclerotic cataract of the right eye.   POSTOPERATIVE DIAGNOSIS:  Cataract   OPERATIVE PROCEDURE:@   SURGEON:  Birder Robson, MD.   ANESTHESIA:  Anesthesiologist: Ronelle Nigh, MD CRNA: Cameron Ali, CRNA  1.      Managed anesthesia care. 2.      0.33ml of Shugarcaine was instilled in the eye following the paracentesis.   COMPLICATIONS:  None.   TECHNIQUE:   Stop and chop   DESCRIPTION OF PROCEDURE:  The patient was examined and consented in the preoperative holding area where the aforementioned topical anesthesia was applied to the right eye and then brought back to the Operating Room where the right eye was prepped and draped in the usual sterile ophthalmic fashion and a lid speculum was placed. A paracentesis was created with the side port blade and the anterior chamber was filled with viscoelastic. A near clear corneal incision was performed with the steel keratome. A continuous curvilinear capsulorrhexis was performed with a cystotome followed by the capsulorrhexis forceps. Hydrodissection and hydrodelineation were carried out with BSS on a blunt cannula. The lens was removed in a stop and chop  technique and the remaining cortical material was removed with the irrigation-aspiration handpiece. The capsular bag was inflated with viscoelastic and the Technis ZCB00  lens was placed in the capsular bag without complication. The remaining viscoelastic was removed from the eye with the irrigation-aspiration handpiece. The wounds were hydrated. The anterior chamber was flushed with BSS and the eye was inflated to physiologic pressure. 0.75ml of Vigamox was placed in the anterior chamber. The wounds were found to be water tight. The eye was dressed with Combigan. The patient was given protective glasses to wear throughout the day and a shield with which to sleep tonight. The patient was also given drops with which to begin a drop regimen today and will follow-up  with me in one day. Implant Name Type Inv. Item Serial No. Manufacturer Lot No. LRB No. Used Action  LENS IOL TECNIS EYHANCE 16.5 - [id number] Intraocular Lens LENS IOL TECNIS EYHANCE 16.5 [id number] JOHNSON   Right 1 Implanted   Procedure(s) with comments: CATARACT EXTRACTION PHACO AND INTRAOCULAR LENS PLACEMENT (IOC) RIGHT 4.23 00:31.5 (Right) - COVID + 01-10-21  Electronically signed: Birder Robson 03/02/2021 10:09 AM

## 2021-03-02 NOTE — Anesthesia Procedure Notes (Signed)
Procedure Name: MAC Date/Time: 03/02/2021 9:54 AM Performed by: Cameron Ali, CRNA Pre-anesthesia Checklist: Patient identified, Emergency Drugs available, Suction available, Timeout performed and Patient being monitored Patient Re-evaluated:Patient Re-evaluated prior to induction Oxygen Delivery Method: Nasal cannula Placement Confirmation: positive ETCO2

## 2021-03-02 NOTE — Anesthesia Postprocedure Evaluation (Signed)
Anesthesia Post Note  Patient: Victoria Dominguez  Procedure(s) Performed: CATARACT EXTRACTION PHACO AND INTRAOCULAR LENS PLACEMENT (IOC) RIGHT 4.23 00:31.5 (Right Eye)     Patient location during evaluation: PACU Anesthesia Type: MAC Level of consciousness: awake and alert and oriented Pain management: satisfactory to patient Vital Signs Assessment: post-procedure vital signs reviewed and stable Respiratory status: spontaneous breathing, nonlabored ventilation and respiratory function stable Cardiovascular status: blood pressure returned to baseline and stable Postop Assessment: Adequate PO intake and No signs of nausea or vomiting Anesthetic complications: no   No complications documented.  Raliegh Ip

## 2021-03-03 ENCOUNTER — Encounter: Payer: Self-pay | Admitting: Ophthalmology

## 2021-03-08 ENCOUNTER — Encounter: Payer: Self-pay | Admitting: Ophthalmology

## 2021-03-11 NOTE — Discharge Instructions (Signed)

## 2021-03-12 ENCOUNTER — Other Ambulatory Visit: Payer: Medicare Other

## 2021-03-16 ENCOUNTER — Encounter
Admission: RE | Disposition: A | Discharge status: Home / Self Care | Payer: Self-pay | Source: Ambulatory Visit | Attending: Ophthalmology

## 2021-03-16 ENCOUNTER — Ambulatory Visit: Payer: Medicare Other | Admitting: Anesthesiology

## 2021-03-16 ENCOUNTER — Ambulatory Visit
Admission: RE | Admit: 2021-03-16 | Discharge: 2021-03-16 | Disposition: A | Discharge status: Home / Self Care | Payer: Medicare Other | Source: Ambulatory Visit | Attending: Ophthalmology | Admitting: Ophthalmology

## 2021-03-16 ENCOUNTER — Encounter: Payer: Self-pay | Admitting: Ophthalmology

## 2021-03-16 DIAGNOSIS — Z79899 Other long term (current) drug therapy: Secondary | ICD-10-CM | POA: Diagnosis not present

## 2021-03-16 DIAGNOSIS — Z8616 Personal history of COVID-19: Secondary | ICD-10-CM | POA: Insufficient documentation

## 2021-03-16 DIAGNOSIS — Z9841 Cataract extraction status, right eye: Secondary | ICD-10-CM | POA: Insufficient documentation

## 2021-03-16 DIAGNOSIS — Z961 Presence of intraocular lens: Secondary | ICD-10-CM | POA: Diagnosis not present

## 2021-03-16 DIAGNOSIS — H2512 Age-related nuclear cataract, left eye: Secondary | ICD-10-CM | POA: Insufficient documentation

## 2021-03-16 DIAGNOSIS — Z7983 Long term (current) use of bisphosphonates: Secondary | ICD-10-CM | POA: Diagnosis not present

## 2021-03-16 HISTORY — PX: CATARACT EXTRACTION W/PHACO: SHX586

## 2021-03-16 SURGERY — PHACOEMULSIFICATION, CATARACT, WITH IOL INSERTION
Anesthesia: Monitor Anesthesia Care | Site: Eye | Laterality: Left

## 2021-03-16 MED ORDER — LIDOCAINE HCL (PF) 2 % IJ SOLN
INTRAOCULAR | Status: DC | PRN
  Administered 2021-03-16: 1 mL

## 2021-03-16 MED ORDER — MIDAZOLAM HCL 2 MG/2ML IJ SOLN
INTRAMUSCULAR | Status: DC | PRN
  Administered 2021-03-16: 1 mg via INTRAVENOUS

## 2021-03-16 MED ORDER — ACETAMINOPHEN 160 MG/5ML PO SOLN: 325.0000 mg | ORAL | Status: DC | PRN

## 2021-03-16 MED ORDER — FENTANYL CITRATE (PF) 100 MCG/2ML IJ SOLN
INTRAMUSCULAR | Status: DC | PRN
  Administered 2021-03-16: 50 ug via INTRAVENOUS

## 2021-03-16 MED ORDER — ARMC OPHTHALMIC DILATING DROPS
1.0000 "application " | OPHTHALMIC | Status: DC | PRN
  Administered 2021-03-16 (×3): 1 via OPHTHALMIC

## 2021-03-16 MED ORDER — EPINEPHRINE PF 1 MG/ML IJ SOLN
INTRAOCULAR | Status: DC | PRN
  Administered 2021-03-16: 50 mL via OPHTHALMIC

## 2021-03-16 MED ORDER — MOXIFLOXACIN HCL 0.5 % OP SOLN
OPHTHALMIC | Status: DC | PRN
  Administered 2021-03-16: 0.2 mL via OPHTHALMIC

## 2021-03-16 MED ORDER — ACETAMINOPHEN 325 MG PO TABS
325.0000 mg | ORAL_TABLET | ORAL | Status: DC | PRN
Start: 2021-03-16 — End: 2021-03-16

## 2021-03-16 MED ORDER — ONDANSETRON HCL 4 MG/2ML IJ SOLN: 4.0000 mg | Freq: Once | INTRAMUSCULAR | Status: DC

## 2021-03-16 MED ORDER — BRIMONIDINE TARTRATE-TIMOLOL 0.2-0.5 % OP SOLN
OPHTHALMIC | Status: DC | PRN
  Administered 2021-03-16: 1 [drp] via OPHTHALMIC

## 2021-03-16 MED ORDER — ONDANSETRON HCL 4 MG/2ML IJ SOLN: 4.0000 mg | Freq: Once | INTRAMUSCULAR | Status: DC | PRN

## 2021-03-16 MED ORDER — TETRACAINE HCL 0.5 % OP SOLN
1.0000 [drp] | OPHTHALMIC | Status: DC | PRN
  Administered 2021-03-16 (×3): 1 [drp] via OPHTHALMIC

## 2021-03-16 MED ORDER — NA CHONDROIT SULF-NA HYALURON 40-17 MG/ML IO SOLN
INTRAOCULAR | Status: DC | PRN
  Administered 2021-03-16: 1 mL via INTRAOCULAR

## 2021-03-16 SURGICAL SUPPLY — 15 items
CANNULA ANT/CHMB 27GA (MISCELLANEOUS) ×4 IMPLANT
GLOVE SURG TRIUMPH 8.0 PF LTX (GLOVE) ×2 IMPLANT
GOWN STRL REUS W/ TWL LRG LVL3 (GOWN DISPOSABLE) ×2 IMPLANT
GOWN STRL REUS W/TWL LRG LVL3 (GOWN DISPOSABLE) ×4
LENS IOL TECNIS EYHANCE 15.0 (Intraocular Lens) ×2 IMPLANT
MARKER SKIN DUAL TIP RULER LAB (MISCELLANEOUS) ×2 IMPLANT
NEEDLE FILTER BLUNT 18X 1/2SAF (NEEDLE) ×1
NEEDLE FILTER BLUNT 18X1 1/2 (NEEDLE) ×1 IMPLANT
PACK EYE AFTER SURG (MISCELLANEOUS) ×2 IMPLANT
PACK OPTHALMIC (MISCELLANEOUS) ×2 IMPLANT
PACK PORFILIO (MISCELLANEOUS) ×2 IMPLANT
SYR 3ML LL SCALE MARK (SYRINGE) ×2 IMPLANT
SYR TB 1ML LUER SLIP (SYRINGE) ×2 IMPLANT
WATER STERILE IRR 250ML POUR (IV SOLUTION) ×2 IMPLANT
WIPE NON LINTING 3.25X3.25 (MISCELLANEOUS) ×2 IMPLANT

## 2021-03-16 NOTE — Op Note (Signed)
PREOPERATIVE DIAGNOSIS:  Nuclear sclerotic cataract of the left eye.   POSTOPERATIVE DIAGNOSIS:  Nuclear sclerotic cataract of the left eye.   OPERATIVE PROCEDURE:@   SURGEON:  Birder Robson, MD.   ANESTHESIA:  Anesthesiologist: Veda Canning, MD CRNA: Mayme Genta, CRNA  1.      Managed anesthesia care. 2.     0.70ml of Shugarcaine was instilled following the paracentesis   COMPLICATIONS:  None.   TECHNIQUE:   Stop and chop   DESCRIPTION OF PROCEDURE:  The patient was examined and consented in the preoperative holding area where the aforementioned topical anesthesia was applied to the left eye and then brought back to the Operating Room where the left eye was prepped and draped in the usual sterile ophthalmic fashion and a lid speculum was placed. A paracentesis was created with the side port blade and the anterior chamber was filled with viscoelastic. A near clear corneal incision was performed with the steel keratome. A continuous curvilinear capsulorrhexis was performed with a cystotome followed by the capsulorrhexis forceps. Hydrodissection and hydrodelineation were carried out with BSS on a blunt cannula. The lens was removed in a stop and chop  technique and the remaining cortical material was removed with the irrigation-aspiration handpiece. The capsular bag was inflated with viscoelastic and the Technis ZCB00 lens was placed in the capsular bag without complication. The remaining viscoelastic was removed from the eye with the irrigation-aspiration handpiece. The wounds were hydrated. The anterior chamber was flushed with BSS and the eye was inflated to physiologic pressure. 0.8ml Vigamox was placed in the anterior chamber. The wounds were found to be water tight. The eye was dressed with Combigan. The patient was given protective glasses to wear throughout the day and a shield with which to sleep tonight. The patient was also given drops with which to begin a drop regimen today and  will follow-up with me in one day. Implant Name Type Inv. Item Serial No. Manufacturer Lot No. LRB No. Used Action  LENS IOL TECNIS EYHANCE 15.0 - [id number] Intraocular Lens LENS IOL TECNIS EYHANCE 15.0 [id number] JOHNSON   Left 1 Implanted    Procedure(s): CATARACT EXTRACTION PHACO AND INTRAOCULAR LENS PLACEMENT (IOC) LEFT 7.06 00:40.7 (Left)  Electronically signed: Birder Robson 03/16/2021 11:15 AM

## 2021-03-16 NOTE — Anesthesia Procedure Notes (Signed)
Performed by: Adiana Smelcer, CRNA Pre-anesthesia Checklist: Patient identified, Emergency Drugs available, Suction available, Timeout performed and Patient being monitored Patient Re-evaluated:Patient Re-evaluated prior to induction Oxygen Delivery Method: Nasal cannula Placement Confirmation: positive ETCO2       

## 2021-03-16 NOTE — Anesthesia Postprocedure Evaluation (Signed)
Anesthesia Post Note  Patient: Victoria Dominguez  Procedure(s) Performed: CATARACT EXTRACTION PHACO AND INTRAOCULAR LENS PLACEMENT (IOC) LEFT 7.06 00:40.7 (Left Eye)     Patient location during evaluation: PACU Anesthesia Type: MAC Level of consciousness: awake Pain management: pain level controlled Vital Signs Assessment: post-procedure vital signs reviewed and stable Respiratory status: respiratory function stable Cardiovascular status: stable Postop Assessment: no apparent nausea or vomiting Anesthetic complications: no   No complications documented.  Veda Canning

## 2021-03-16 NOTE — Anesthesia Preprocedure Evaluation (Addendum)
Anesthesia Evaluation  Patient identified by MRN, date of birth, ID band Patient awake    Reviewed: Allergy & Precautions, H&P , NPO status   Airway Mallampati: II  TM Distance: >3 FB Neck ROM: full    Dental  (+) Upper Dentures   Pulmonary    Pulmonary exam normal        Cardiovascular hypertension,  Rhythm:Regular Rate:Normal     Neuro/Psych    GI/Hepatic   Endo/Other    Renal/GU      Musculoskeletal   Abdominal   Peds  Hematology   Anesthesia Other Findings   Reproductive/Obstetrics                             Anesthesia Physical  Anesthesia Plan  ASA: II  Anesthesia Plan: MAC   Post-op Pain Management:    Induction:   PONV Risk Score and Plan: 2 and Treatment may vary due to age or medical condition, TIVA and Midazolam  Airway Management Planned:   Additional Equipment:   Intra-op Plan:   Post-operative Plan:   Informed Consent: I have reviewed the patients History and Physical, chart, labs and discussed the procedure including the risks, benefits and alternatives for the proposed anesthesia with the patient or authorized representative who has indicated his/her understanding and acceptance.       Plan Discussed with: CRNA  Anesthesia Plan Comments:         Anesthesia Quick Evaluation

## 2021-03-16 NOTE — H&P (Signed)
[facility]   Primary Care Physician:  Mary[place] Pink, MD Ophthalmologist: Dr. George Ina  Pre-Procedure History & Physical: HPI:  Victoria Dominguez is a 70 y.o. female here for cataract surgery.   Past Medical History:  Diagnosis Date  . Hypertension     Past Surgical History:  Procedure Laterality Date  . ABDOMINAL HYSTERECTOMY    . CATARACT EXTRACTION W/PHACO Right 03/02/2021   Procedure: CATARACT EXTRACTION PHACO AND INTRAOCULAR LENS PLACEMENT (IOC) RIGHT 4.23 00:31.5;  Surgeon: Birder Robson, MD;  Location: [place];  Service: Ophthalmology;  Laterality: Right;  COVID + 01-10-21  . ORIF WRIST FRACTURE Left 09/05/2019   Procedure: OPEN REDUCTION INTERNAL FIXATION (ORIF) WRIST FRACTURE;  Surgeon: Carole Civil, MD;  Location: [facility];  Service: Orthopedics;  Laterality: Left;    Prior to Admission medications   Medication Sig Start Date End Date Taking? Authorizing Provider  citalopram (CELEXA) 10 MG tablet Take 10 mg by mouth daily.   Yes [provider]  ibandronate (BONIVA) 150 MG tablet Take by mouth. 07/01/20  Yes [provider]  ibuprofen (ADVIL) 800 MG tablet Take 1 tablet (800 mg total) by mouth every 8 (eight) hours as needed. 09/05/19  Yes Carole Civil, MD  lisinopril (ZESTRIL) 10 MG tablet  08/07/19  Yes [provider]  traZODone (DESYREL) 100 MG tablet Take by mouth. 07/01/20 03/16/21 Yes [provider]  cetirizine (ZYRTEC) 10 MG tablet Take by mouth. Patient not taking: Reported on 02/18/2021 10/10/18 10/10/19  [provider]  fluticasone (FLONASE) 50 MCG/ACT nasal spray Place into the nose. Patient not taking: Reported on 03/02/2021 10/10/18 10/10/19  [provider]  omeprazole (PRILOSEC) 20 MG capsule Take by mouth. 05/03/19 05/02/20  [provider]    Allergies as of 02/01/2021  . (No Known Allergies)    Family History  Problem Relation Age of Onset  . Breast cancer  Sister 32    Social History   Socioeconomic History  . Marital status: Married    Spouse name: Not on file  . Number of children: Not on file  . Years of education: Not on file  . Highest education level: Not on file  Occupational History  . Not on file  Tobacco Use  . Smoking status: Never Smoker  . Smokeless tobacco: Never Used  Substance and Sexual Activity  . Alcohol use: Never  . Drug use: Never  . Sexual activity: Not on file  Other Topics Concern  . Not on file  Social History Narrative  . Not on file   Social Determinants of Health   Financial Resource Strain: Not on file  Food Insecurity: Not on file  Transportation Needs: Not on file  Physical Activity: Not on file  Stress: Not on file  Social Connections: Not on file  Intimate Partner Violence: Not on file    Review of Systems: See HPI, otherwise negative ROS  Physical Exam: BP 124/66   Pulse 93   Temp (!) 97.2 F (36.2 C) (Temporal)   Resp 16   Ht 5\' 5"  (1.651 m)   Wt 56.7 kg   SpO2 100%   BMI 20.80 kg/m  General:   Alert,  pleasant and cooperative in NAD Head:  Normocephalic and atraumatic. Respiratory:  Normal work of breathing. Cardiovascular:  RRR  Impression/Plan: Victoria Dominguez is here for cataract surgery.  Risks, benefits, limitations, and alternatives regarding cataract surgery have been reviewed with the patient.  Questions have been answered.  All parties agreeable.   Birder Robson, MD  03/16/2021, 10:49 AM

## 2021-03-16 NOTE — Transfer of Care (Signed)
Immediate Anesthesia Transfer of Care Note  Patient: Victoria Dominguez  Procedure(s) Performed: CATARACT EXTRACTION PHACO AND INTRAOCULAR LENS PLACEMENT (IOC) LEFT 7.06 00:40.7 (Left Eye)  Patient Location: PACU  Anesthesia Type: MAC  Level of Consciousness: awake, alert  and patient cooperative  Airway and Oxygen Therapy: Patient Spontanous Breathing and Patient connected to supplemental oxygen  Post-op Assessment: Post-op Vital signs reviewed, Patient's Cardiovascular Status Stable, Respiratory Function Stable, Patent Airway and No signs of Nausea or vomiting  Post-op Vital Signs: Reviewed and stable  Complications: No complications documented.

## 2021-05-18 ENCOUNTER — Ambulatory Visit: Payer: Medicare Other | Admitting: Dermatology

## 2021-05-19 ENCOUNTER — Other Ambulatory Visit: Payer: Self-pay

## 2021-05-19 ENCOUNTER — Ambulatory Visit (INDEPENDENT_AMBULATORY_CARE_PROVIDER_SITE_OTHER): Payer: Medicare Other | Admitting: Dermatology

## 2021-05-19 DIAGNOSIS — L570 Actinic keratosis: Secondary | ICD-10-CM | POA: Diagnosis not present

## 2021-05-19 DIAGNOSIS — L820 Inflamed seborrheic keratosis: Secondary | ICD-10-CM

## 2021-05-19 DIAGNOSIS — L578 Other skin changes due to chronic exposure to nonionizing radiation: Secondary | ICD-10-CM | POA: Diagnosis not present

## 2021-05-19 NOTE — Progress Notes (Signed)
   Follow-Up Visit   Subjective  Victoria Dominguez is a 70 y.o. female who presents for the following: Actinic Keratosis (Face, f/u PDT 12/24/20). She has other areas to be evaluated.  The following portions of the chart were reviewed this encounter and updated as appropriate:   Tobacco  Allergies  Meds  Problems  Med Hx  Surg Hx  Fam Hx     Review of Systems:  No other skin or systemic complaints except as noted in HPI or Assessment and Plan.  Objective  Well appearing patient in no apparent distress; mood and affect are within normal limits.  A focused examination was performed including face. Relevant physical exam findings are noted in the Assessment and Plan.  face x 18 (18) Pink scaly macules   R nasolabial x 1, Total = 1 Erythematous keratotic or waxy stuck-on papule or plaque.    Assessment & Plan  AK (actinic keratosis) (18) face x 18  Destruction of lesion - face x 18 Complexity: simple   Destruction method: cryotherapy   Informed consent: discussed and consent obtained   Timeout:  patient name, date of birth, surgical site, and procedure verified Lesion destroyed using liquid nitrogen: Yes   Region frozen until ice ball extended beyond lesion: Yes   Outcome: patient tolerated procedure well with no complications   Post-procedure details: wound care instructions given    Inflamed seborrheic keratosis R nasolabial x 1, Total = 1  Destruction of lesion - R nasolabial x 1, Total = 1 Complexity: simple   Destruction method: cryotherapy   Informed consent: discussed and consent obtained   Timeout:  patient name, date of birth, surgical site, and procedure verified Lesion destroyed using liquid nitrogen: Yes   Region frozen until ice ball extended beyond lesion: Yes   Outcome: patient tolerated procedure well with no complications   Post-procedure details: wound care instructions given    Actinic Damage - chronic, secondary to cumulative UV radiation  exposure/sun exposure over time - diffuse scaly erythematous macules with underlying dyspigmentation - Recommend daily broad spectrum sunscreen SPF 30+ to sun-exposed areas, reapply every 2 hours as needed.  - Recommend staying in the shade or wearing long sleeves, sun glasses (UVA+UVB protection) and wide brim hats (4-inch brim around the entire circumference of the hat). - Call for new or changing lesions.  Return in about 7 months (around 12/19/2021) for AK f/u.  I, Othelia Pulling, RMA, am acting as scribe for Sarina Ser, MD . Documentation: I have reviewed the above documentation for accuracy and completeness, and I agree with the above.  Sarina Ser, MD

## 2021-05-19 NOTE — Patient Instructions (Signed)

## 2021-05-29 ENCOUNTER — Encounter: Payer: Self-pay | Admitting: Dermatology

## 2021-06-01 ENCOUNTER — Ambulatory Visit: Payer: Medicare Other | Admitting: Dermatology

## 2021-06-03 IMAGING — RF DG C-ARM 1-60 MIN
1 series · 10 of 10 positions shown · IV contrast (agent unspecified)
Comparison: 08/28/2019.

CLINICAL DATA: ORIF left wrist.

EXAM:
DG C-ARM 1-60 MIN; LEFT WRIST - COMPLETE 3+ VIEW
CONTRAST:  None.
FLUOROSCOPY TIME:  Fluoroscopy Time:  0 minutes 52 seconds.
Number of Acquired Spot Images: 9

[Series 1: run · 10 of 10 slices shown]
[im 1/10]
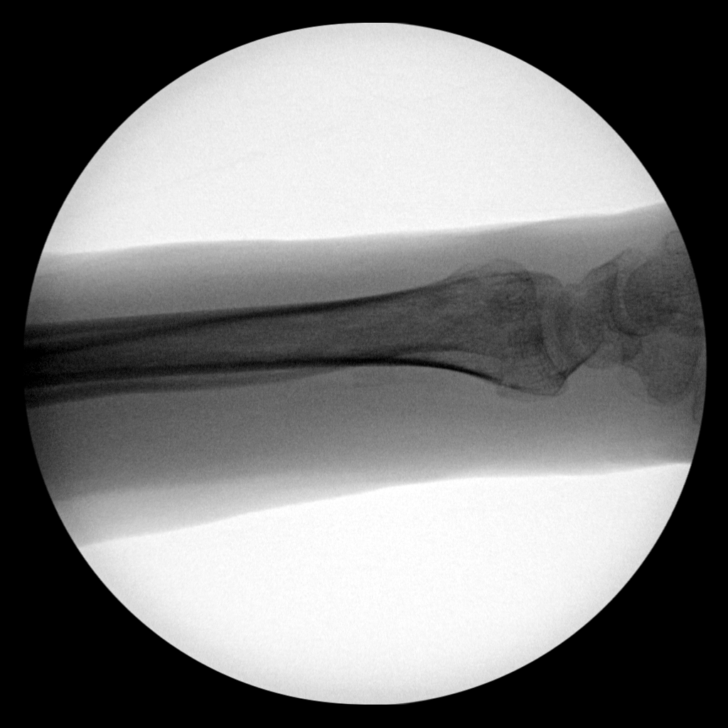
[im 2/10]
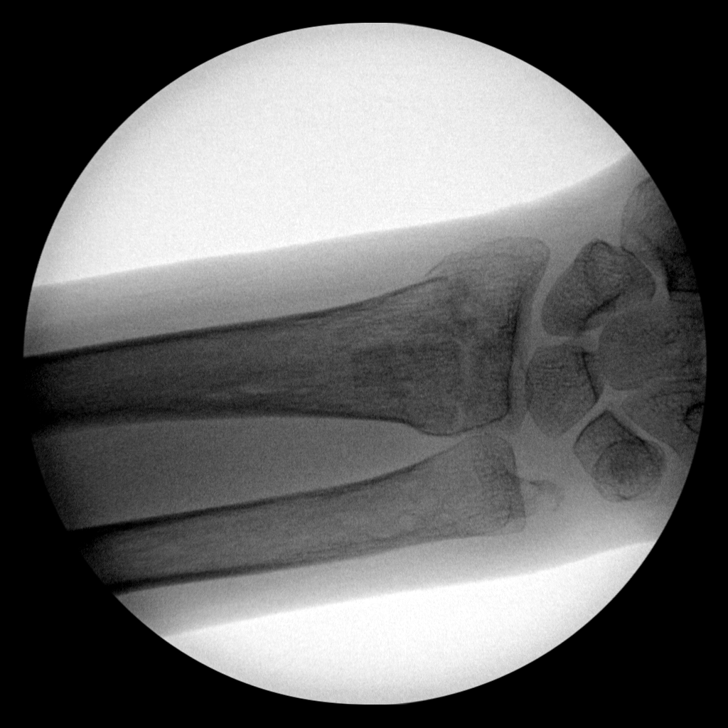
[im 3/10]
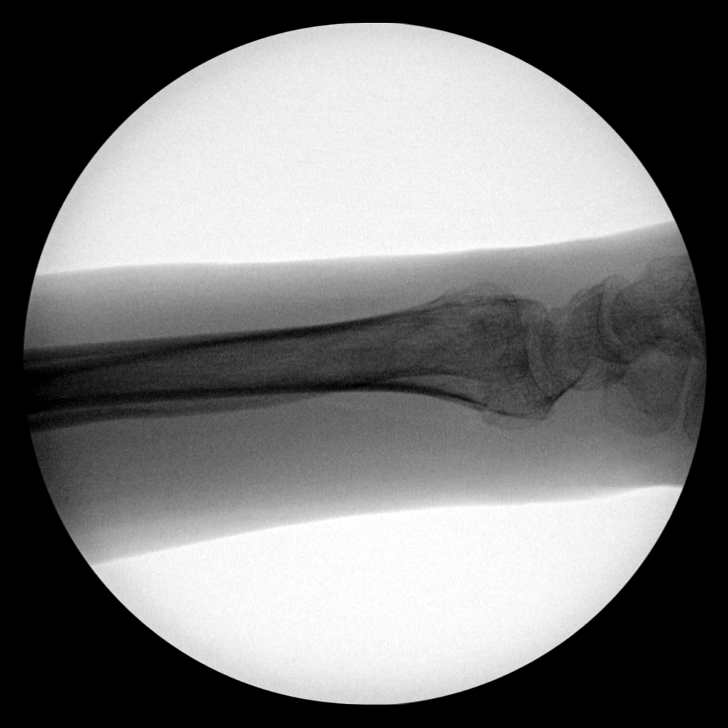
[im 4/10]
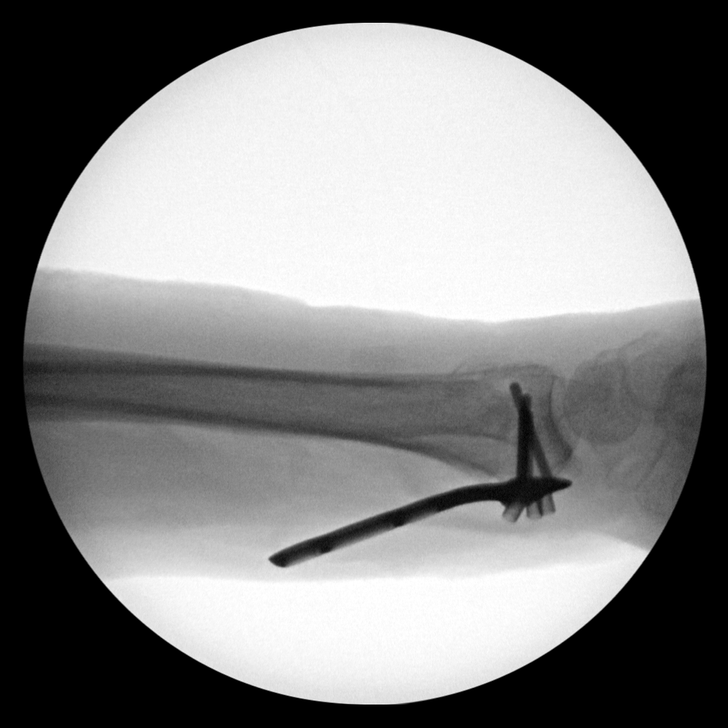
[im 5/10]
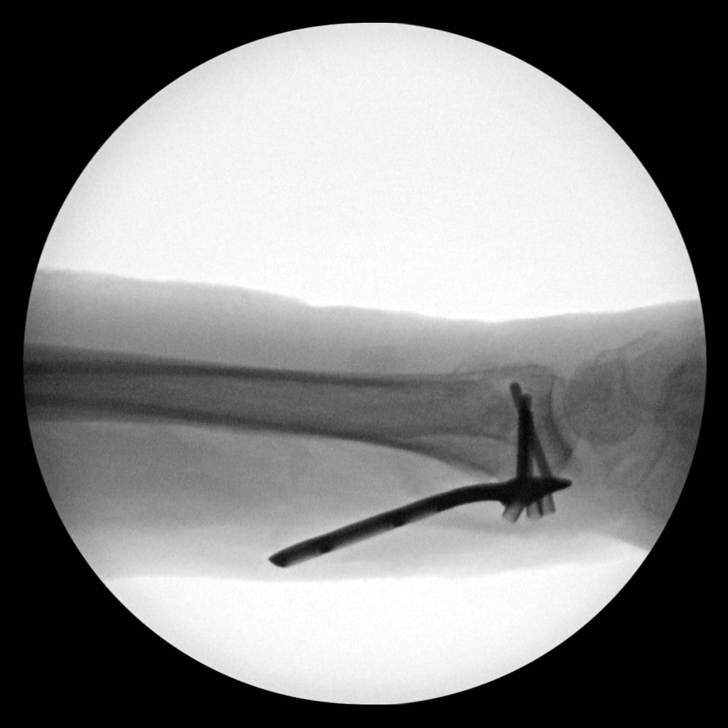
[im 6/10]
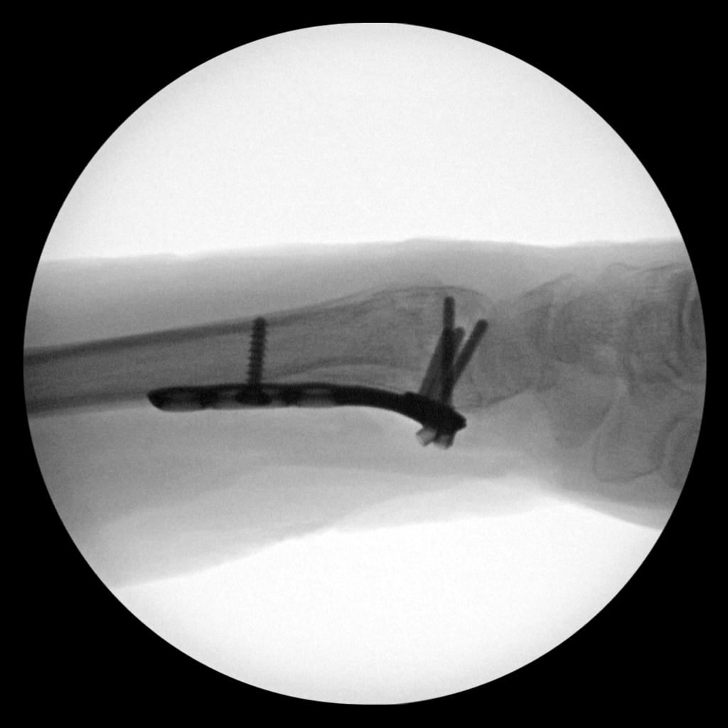
[im 7/10]
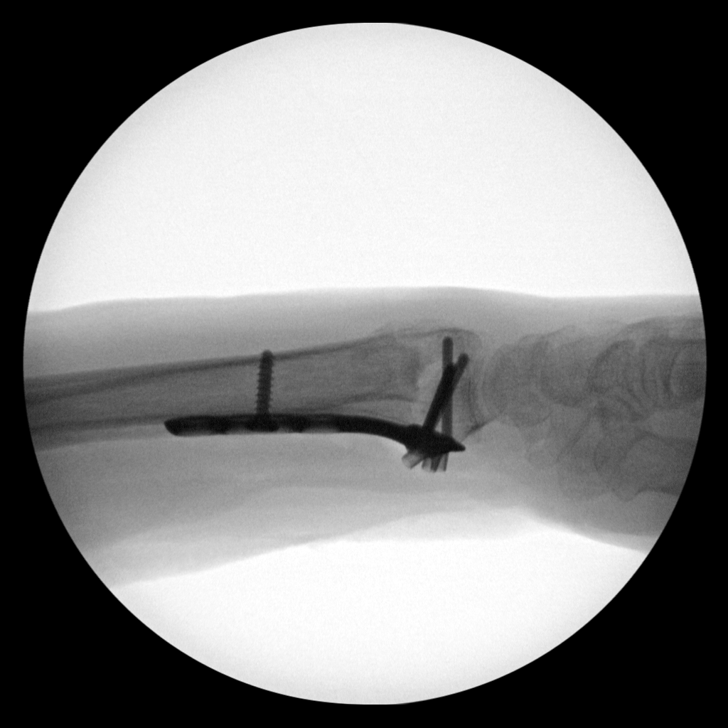
[im 8/10]
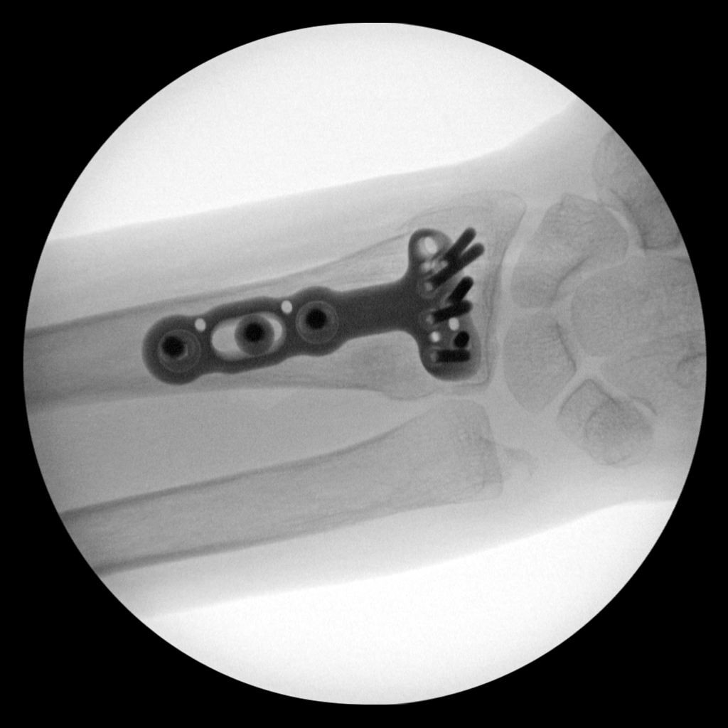
[im 9/10]
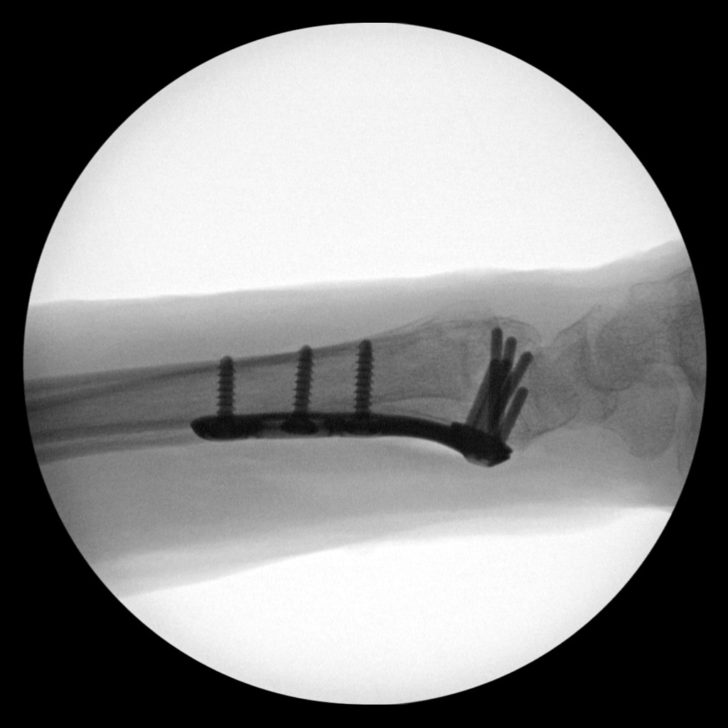
[im 10/10]
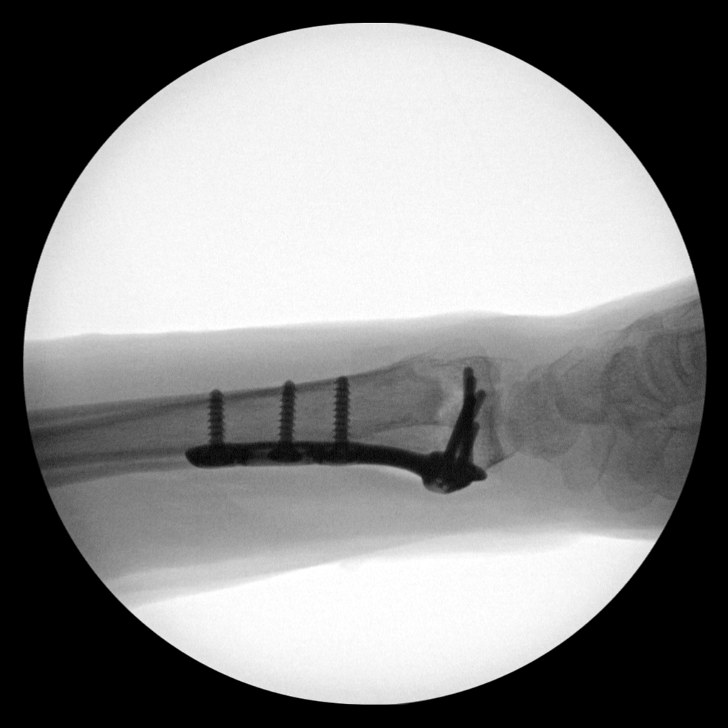

[10 of 10 positions shown; findings below may reference images not displayed]

FINDINGS: Plate and screw fixation distal radial fracture. Hardware intact.
Anatomic alignment.
IMPRESSION: ORIF distal radial fracture with anatomic alignment.

## 2021-06-25 ENCOUNTER — Other Ambulatory Visit: Payer: Self-pay | Admitting: Family Medicine

## 2021-06-25 DIAGNOSIS — Z1231 Encounter for screening mammogram for malignant neoplasm of breast: Secondary | ICD-10-CM

## 2021-08-16 ENCOUNTER — Ambulatory Visit
Admission: RE | Admit: 2021-08-16 | Discharge: 2021-08-16 | Disposition: A | Discharge status: Home / Self Care | Payer: Medicare Other | Source: Ambulatory Visit | Attending: Family Medicine | Admitting: Family Medicine

## 2021-08-16 ENCOUNTER — Other Ambulatory Visit: Payer: Self-pay

## 2021-08-16 DIAGNOSIS — Z1231 Encounter for screening mammogram for malignant neoplasm of breast: Secondary | ICD-10-CM | POA: Diagnosis present

## 2021-11-06 ENCOUNTER — Other Ambulatory Visit: Payer: Self-pay

## 2021-11-06 ENCOUNTER — Emergency Department (HOSPITAL_COMMUNITY): Payer: Medicare Other

## 2021-11-06 ENCOUNTER — Emergency Department (HOSPITAL_COMMUNITY)
Admission: EM | Admit: 2021-11-06 | Discharge: 2021-11-06 | Disposition: A | Discharge status: Home / Self Care | Payer: Medicare Other | Attending: Emergency Medicine | Admitting: Emergency Medicine

## 2021-11-06 ENCOUNTER — Encounter (HOSPITAL_COMMUNITY): Payer: Self-pay

## 2021-11-06 DIAGNOSIS — S52571A Other intraarticular fracture of lower end of right radius, initial encounter for closed fracture: Secondary | ICD-10-CM | POA: Diagnosis not present

## 2021-11-06 DIAGNOSIS — I10 Essential (primary) hypertension: Secondary | ICD-10-CM | POA: Insufficient documentation

## 2021-11-06 DIAGNOSIS — M79601 Pain in right arm: Secondary | ICD-10-CM | POA: Diagnosis not present

## 2021-11-06 DIAGNOSIS — S52611A Displaced fracture of right ulna styloid process, initial encounter for closed fracture: Secondary | ICD-10-CM | POA: Diagnosis not present

## 2021-11-06 DIAGNOSIS — S6991XA Unspecified injury of right wrist, hand and finger(s), initial encounter: Secondary | ICD-10-CM | POA: Diagnosis present

## 2021-11-06 DIAGNOSIS — Y9301 Activity, walking, marching and hiking: Secondary | ICD-10-CM | POA: Diagnosis not present

## 2021-11-06 DIAGNOSIS — W000XXA Fall on same level due to ice and snow, initial encounter: Secondary | ICD-10-CM | POA: Diagnosis not present

## 2021-11-06 DIAGNOSIS — W19XXXA Unspecified fall, initial encounter: Secondary | ICD-10-CM

## 2021-11-06 NOTE — ED Notes (Signed)
Splint applied by Christen Bame, RN

## 2021-11-06 NOTE — ED Notes (Signed)
Pt A&OX4 ambulatory at d/c with independent steady gait, NAD. Pt verbalized understanding of d/c instructions and follow up care. ?

## 2021-11-06 NOTE — ED Provider Notes (Signed)
Emergency Department Provider Note   I have reviewed the triage vital signs and the nursing notes.   HISTORY  Chief Complaint Fall   HPI Victoria Dominguez is a 70 y.o. female with past medical history reviewed below presents emergency department with right arm pain after fall this morning.  Patient took her dog out for a walk this morning and slipped on the icy ground.  She fell landing mainly on her right arm.  She denies head injury or loss of consciousness.  She is not having neck or back pain.  No numbness or weakness in the extremities.  Denies any syncope.  She not having pain in her chest or abdomen.  Pain is mild to moderate and worse with touching or moving the area. Denies shoulder pain.    Past Medical History:  Diagnosis Date   Hypertension     Patient Active Problem List   Diagnosis Date Noted   Osteoporosis 01/06/2020   Fracture of left wrist with routine healing 10/04/2019   Fracture, Colles, left, closed 09/09/2019   S/P ORIF (open reduction internal fixation) fracture 09/05/2019 left wrist  09/09/2019   Left wrist fracture    Hypertension 09/02/2019    Past Surgical History:  Procedure Laterality Date   ABDOMINAL HYSTERECTOMY     CATARACT EXTRACTION W/PHACO Right 03/02/2021   Procedure: CATARACT EXTRACTION PHACO AND INTRAOCULAR LENS PLACEMENT ([facility]) RIGHT 4.23 00:31.5;  Surgeon: Birder Robson, MD;  Location: [place];  Service: Ophthalmology;  Laterality: Right;  COVID + 01-10-21   CATARACT EXTRACTION W/PHACO Left 03/16/2021   Procedure: CATARACT EXTRACTION PHACO AND INTRAOCULAR LENS PLACEMENT ([facility]) LEFT 7.06 00:40.7;  Surgeon: Birder Robson, MD;  Location: [place];  Service: Ophthalmology;  Laterality: Left;   ORIF WRIST FRACTURE Left 09/05/2019   Procedure: OPEN REDUCTION INTERNAL FIXATION (ORIF) WRIST FRACTURE;  Surgeon: Carole Civil, MD;  Location: [facility];  Service: Orthopedics;  Laterality: Left;    Allergies Patient  has no known allergies.  Family History  Problem Relation Age of Onset   Breast cancer Sister 18    Social History Social History   Tobacco Use   Smoking status: Never   Smokeless tobacco: Never  Substance Use Topics   Alcohol use: Never   Drug use: Never    Review of Systems  Constitutional: No fever/chills Cardiovascular: Denies chest pain. Respiratory: Denies shortness of breath. Gastrointestinal: No abdominal pain.   Musculoskeletal: Right arm pain (elbow to wrist).  Skin: Negative for rash. No lacerations or bruising.  Neurological: Negative for focal weakness or numbness.  ____________________________________________   PHYSICAL EXAM:  VITAL SIGNS: ED Triage Vitals  Enc Vitals Group     BP 11/06/21 0814 (!) 151/76     Pulse Rate 11/06/21 0814 99     Resp 11/06/21 0814 16     Temp 11/06/21 0814 98.7 F (37.1 C)     Temp Source 11/06/21 0814 Oral     SpO2 11/06/21 0814 100 %     Weight 11/06/21 0815 130 lb (59 kg)     Height 11/06/21 0815 5\' 5"  (1.651 m)   Constitutional: Alert and oriented. Well appearing and in no acute distress. Eyes: Conjunctivae are normal.  Head: Atraumatic. Nose: No congestion/rhinnorhea. Mouth/Throat: Mucous membranes are moist.   Neck: No stridor.  No cervical spine tenderness to palpation. Cardiovascular: Normal rate, regular rhythm. Good peripheral circulation. Grossly normal heart sounds.   Respiratory: Normal respiratory effort.  No retractions. Lungs CTAB. Gastrointestinal: Soft  and nontender. No distention.  Musculoskeletal: Normal range of motion of the right shoulder.  Mild decreased range of motion of the right elbow and right wrist secondary to pain but no obvious deformity.  Faint abrasion to the right elbow but no laceration.  No bruising or other hematoma.  No scaphoid tenderness.  Normal range of motion of the fingers.  No tenderness over the hand.  Neurologic:  Normal speech and language. No gross focal neurologic  deficits are appreciated.  Skin:  Skin is warm and dry. 1 cm abrasion to the right elbow without bleeding or laceration.   ____________________________________________  RADIOLOGY  DG Elbow Complete Right  Result Date: 11/06/2021 CLINICAL DATA:  Fall, elbow pain EXAM: RIGHT ELBOW - COMPLETE 3+ VIEW COMPARISON:  None. FINDINGS: There is no evidence of fracture, dislocation, or joint effusion. There is no evidence of arthropathy or other focal bone abnormality. Soft tissues are unremarkable. IMPRESSION: No acute osseous abnormality identified. Electronically Signed   By: Ofilia Neas M.D.   On: 11/06/2021 09:04   DG Wrist Complete Right  Result Date: 11/06/2021 CLINICAL DATA:  Fall, wrist pain EXAM: RIGHT WRIST - COMPLETE 3+ VIEW COMPARISON:  None. FINDINGS: Acute oblique fracture of the distal radius which extends to the radiocarpal articular surface with up to 4 mm volar displacement of the main fracture fragment. There is also minimally displaced acute fracture of the ulnar styloid process. Carpal bones appear intact and aligned. Soft tissue swelling of the wrist. IMPRESSION: Acute intra-articular fracture of the distal radius and acute fracture of the ulnar styloid, as described. Electronically Signed   By: Ofilia Neas M.D.   On: 11/06/2021 08:59    ____________________________________________   PROCEDURES  Procedure(s) performed:   Procedures  None  ____________________________________________   INITIAL IMPRESSION / ASSESSMENT AND PLAN / ED COURSE  Pertinent labs & imaging results that were available during my care of the patient were reviewed by me and considered in my medical decision making (see chart for details).   Patient presents to the emergency department for evaluation after mechanical fall (slip on ice) with pain in the right arm.  No midline cervical spine tenderness or obvious head injury.  Range of motion of the right upper extremity is mostly preserved.   Patient is ambulatory here in the emergency department without difficulty or pain in the lower extremities.  Plan for plain films of the right elbow and right wrist.  Differential includes contusion, sprain, nondisplaced fracture.  Very low suspicion for dislocation.  No shoulder tenderness with preserved range of motion and no subjective pain symptoms.  Will defer imaging of the right shoulder.   Patient with a distal radius fracture and ulnar styloid fracture as described above and imaging.  Reviewed this diagnosis with the patient and will provide a sugar-tong splint.  Advised to keep clean and dry along with elevated.  She can apply cool compress today.  She has an established relationship with Dr. Aline Brochure.  Plan to have her call the office on Monday for a follow-up appointment.  Pain is well controlled here.  We will provide a sling but advised frequent removal and shoulder range of motion exercises to prevent shoulder stiffness.  ____________________________________________  FINAL CLINICAL IMPRESSION(S) / ED DIAGNOSES  Final diagnoses:  Other closed intra-articular fracture of distal end of right radius, initial encounter  Closed displaced fracture of styloid process of right ulna, initial encounter  Fall, initial encounter      Note:  This document was  prepared using Systems analyst and may include unintentional dictation errors.  Nanda Quinton, MD, [facility] Emergency Medicine    Maleki Hippe, [facility], MD 11/06/21 [phone]

## 2021-11-06 NOTE — ED Triage Notes (Signed)
Pt fell this morning on the ice taking the dog outside. Pt denies hitting head or LOC. Pt c/o right arm pain from right shoulder to right wrist.

## 2021-11-06 NOTE — Discharge Instructions (Signed)
You were seen in the ED after a fall. You have a fracture to the right wrist. I have placed you in a splint. Please keep the splint clean and dry. Elevated the wrist above the level of your heart at home. You have apply a cool compress as needed to reduce pain/swelling. Call Dr. Ruthe Mannan office to schedule a follow up appointment. The sling is provided for comfort but you may find that you do not need it. If you are using the sling, be sure to remove it at least once per hour and move your shoulder to prevent joint stiffness.

## 2021-11-08 ENCOUNTER — Telehealth: Payer: Self-pay | Admitting: Orthopedic Surgery

## 2021-11-08 NOTE — Telephone Encounter (Signed)
-----   Message from Carole Civil, MD sent at 11/07/2021  9:31 AM EST ----- Tues pm

## 2021-11-08 NOTE — Telephone Encounter (Signed)
Called patient per Dr Ruthe Mannan message, called patient to offer appointment for tomorrow, Tuesday, 11/09/21; reached voice mail, left message to return call.

## 2021-11-12 NOTE — Telephone Encounter (Signed)
As of 11/12/21, no response to calls/messages left for patient.

## 2021-12-30 ENCOUNTER — Ambulatory Visit: Payer: Medicare Other | Admitting: Dermatology

## 2022-01-05 ENCOUNTER — Ambulatory Visit (HOSPITAL_COMMUNITY): Payer: Medicare Other | Attending: Orthopedic Surgery

## 2022-01-05 ENCOUNTER — Other Ambulatory Visit: Payer: Self-pay

## 2022-01-05 ENCOUNTER — Encounter (HOSPITAL_COMMUNITY): Payer: Self-pay

## 2022-01-05 DIAGNOSIS — M25631 Stiffness of right wrist, not elsewhere classified: Secondary | ICD-10-CM | POA: Insufficient documentation

## 2022-01-05 DIAGNOSIS — R29898 Other symptoms and signs involving the musculoskeletal system: Secondary | ICD-10-CM | POA: Insufficient documentation

## 2022-01-05 DIAGNOSIS — M25531 Pain in right wrist: Secondary | ICD-10-CM | POA: Diagnosis present

## 2022-01-05 NOTE — Therapy (Signed)
Victoria Dominguez, [place], [address] Phone: [phone]   Fax:  [phone]  Occupational Therapy Evaluation  Patient Details  Name: Victoria Dominguez MRN: [id number] Date of Birth: Apr 09, 1951 Referring Provider (OT): Ailene Ards, MD   Encounter Date: 01/05/2022   OT End of Session - 01/05/22 2113     Visit Number 1    Number of Visits 12    Date for OT Re-Evaluation 02/16/22    Authorization Type 1) Medicare 2) [employer] medicare    Progress Note Due on Visit 10    OT Start Time 1600    OT Stop Time 1641    OT Time Calculation (min) 41 min    Activity Tolerance Patient tolerated treatment well    Behavior During Therapy [facility] for tasks assessed/performed             Past Medical History:  Diagnosis Date   Hypertension     Past Surgical History:  Procedure Laterality Date   ABDOMINAL HYSTERECTOMY     CATARACT EXTRACTION W/PHACO Right 03/02/2021   Procedure: CATARACT EXTRACTION PHACO AND INTRAOCULAR LENS PLACEMENT ([place]) RIGHT 4.23 00:31.5;  Surgeon: Birder Robson, MD;  Location: [place];  Service: Ophthalmology;  Laterality: Right;  COVID + 01-10-21   CATARACT EXTRACTION W/PHACO Left 03/16/2021   Procedure: CATARACT EXTRACTION PHACO AND INTRAOCULAR LENS PLACEMENT ([facility]) LEFT 7.06 00:40.7;  Surgeon: Birder Robson, MD;  Location: [place];  Service: Ophthalmology;  Laterality: Left;   ORIF WRIST FRACTURE Left 09/05/2019   Procedure: OPEN REDUCTION INTERNAL FIXATION (ORIF) WRIST FRACTURE;  Surgeon: Carole Civil, MD;  Location: [facility];  Service: Orthopedics;  Laterality: Left;    There were no vitals filed for this visit.   Subjective Assessment - 01/05/22 1611     Subjective  S: This time I injuried the right wrist    Pertinent History Patient is a 71 y/o female S/P ORIF of the right wrist which was completed on 11/18/21. Dr. Edmonia Lynch has referred patient to  occupational therapy for evaluation and treatment.    Patient Stated Goals To increase the use of her right hand.    Currently in Pain? Other (Comment)   Ocassionally will pain when picking up heavy items, grabbing an item or pushing ROM further 9/10.              [facility] OT Assessment - 01/05/22 1613       Assessment   Medical Diagnosis ORIF right wrist    Referring Provider (OT) Ailene Ards, MD    Onset Date/Surgical Date 11/18/21    Hand Dominance Right    Next MD Visit 01/26/22    Prior Therapy yes      Precautions   Precautions Other (comment)    Precaution Comments Wearing a soft resting hand brace at night or when out in public for comfort. Standard protocol: continue with brace at night and in public. Wean from brace at 8 weeks. Begin progressive resistive exercises as tolerated.      Restrictions   Weight Bearing Restrictions Yes    RUE Weight Bearing Weight bearing as tolerated      Balance Screen   Has the patient fallen in the past 6 months Yes    How many times? 1    Has the patient had a decrease in activity level because of a fear of falling?  No    Is the patient reluctant to leave their  home because of a fear of falling?  No      Home  Environment   Family/patient expects to be discharged to: Private residence      Prior Function   Level of [place] Retired      ADL   ADL comments Difficulty donning/doffing bra, mobility/ROM, strength      Mobility   Mobility Status Independent      Vision - History   Baseline Vision Wears glasses all the time      Cognition   Overall Cognitive Status Within Functional Limits for tasks assessed      Observation/Other Assessments   Other Surveys  Select    Quick DASH  63.64      Sensation   Additional Comments Reports pin and needles/tingling sensation from right wrist to back of neck. More than likely from a pinched nerve. Reports mild numbness in fingers.      Coordination   9  Hole Peg Test Right    Right 9 Hole Peg Test 17.0"      Edema   Edema right wrist superior to the ulnar styloid process 15.25 cm (left: 14.3 cm), inferior to the ulnar styloid process 16.0 cm (left: 14.7 cm)      ROM / Strength   AROM / PROM / Strength AROM;PROM;Strength      Palpation   Palpation comment Moderate fascial restrictions in the left wrist and forearm.      AROM   Overall AROM Comments Assessed seated. Wrist measured in gravity eliminated    AROM Assessment Site Wrist;Forearm    Right/Left Forearm Right    Right Forearm Pronation 90 Degrees    Right Forearm Supination 48 Degrees    Right/Left Wrist Right    Right Wrist Extension 50 Degrees    Right Wrist Flexion 34 Degrees    Right Wrist Radial Deviation 28 Degrees    Right Wrist Ulnar Deviation 10 Degrees      Strength   Strength Assessment Site Hand    Right/Left hand Right    Right Hand Grip (lbs) 25    Right Hand Lateral Pinch 13 lbs   WNL   Right Hand 3 Point Pinch 10 lbs   WNL     Right Hand PROM   R Ring PIP 0-100 --   lack 20 degrees of full extension                Katina Dung - 01/05/22 1616     Open a tight or new jar Unable    Do heavy household chores (wash walls, wash floors) Unable    Carry a shopping bag or briefcase Moderate difficulty    Wash your back Unable    Use a knife to cut food Moderate difficulty    Recreational activities in which you take some force or impact through your arm, shoulder, or hand (golf, hammering, tennis) Unable    During the past week, to what extent has your arm, shoulder or hand problem interfered with your normal social activities with family, friends, neighbors, or groups? Not at all    During the past week, to what extent has your arm, shoulder or hand problem limited your work or other regular daily activities Modererately    Arm, shoulder, or hand pain. Moderate    Tingling (pins and needles) in your arm, shoulder, or hand Extreme    Difficulty  Sleeping No difficulty    DASH Score 63.64 %  OT Education - 01/05/22 2109     Education Details yellow putty, supination forearm stretch    Person(s) Educated Patient    Methods Explanation;Demonstration;Verbal cues;Handout    Comprehension Verbalized understanding              OT Short Term Goals - 01/05/22 2119       OT SHORT TERM GOAL #1   Title Patient will be educated and independent with HEP in order to faciliate progress in therapy and allow her to return to using her right UE as her non dominant extremity for 75% or more of daily tasks.    Time 3    Period Weeks    Status New    Target Date 01/26/22      OT SHORT TERM GOAL #2   Title Patient will increase right wrist and forearm P/ROM to [facility] in order to increase her ability to maage dishes while cooking or washing.    Time 3    Period Weeks    Status New      OT SHORT TERM GOAL #3   Title Patient will report a decrease in right wrist pain of approximately 5/10 or less when completing daily tasks.    Time 3    Period Weeks    Status New               OT Long Term Goals - 01/05/22 2122       OT LONG TERM GOAL #1   Title Patient will increase right wrist and hand A/ROM to [facility] in order to be able to stabilize containers and jars when attempting to open them.    Time 6    Period Weeks    Status New    Target Date 02/16/22      OT LONG TERM GOAL #2   Title Patient will increase her grip strength to 45# in order to return to holding onto items such as grocery bags without difficulty.    Time 6    Period Weeks    Status New      OT LONG TERM GOAL #3   Title Pt will increase her right wrist strength to 4/5 or better in order to return to holding and manipulating items of moderate weight with less difficulty.    Time 6    Period Weeks    Status New      OT LONG TERM GOAL #4   Title Pt will decrease her right wrist/forearm fascial restrictions to min amount or  less in order to increase the functional mobility needed to complete basic household tasks such as bathing and dressing.    Time 6    Period Weeks    Status New                   Plan - 01/05/22 2115     Clinical Impression Statement A: Patient is a 71 y/o female S/P right wrist ORIF causing increased pain, fascial restrictions, and decreased strength and ROM resulting in severe difficulty completing required daily tasks using her RUE as her dominant extremity.    OT Occupational Profile and History Detailed Assessment- Review of Records and additional review of physical, cognitive, psychosocial history related to current functional performance    Occupational performance deficits (Please refer to evaluation for details): ADL's;IADL's;Leisure;Rest and Sleep    Body Structure / Function / Physical Skills ADL;Strength;Pain;Edema;UE functional use;ROM;IADL;Fascial restriction;Mobility    Rehab Potential Excellent    Clinical Decision Making  Limited treatment options, no task modification necessary    Comorbidities Affecting Occupational Performance: None    Modification or Assistance to Complete Evaluation  No modification of tasks or assist necessary to complete eval    OT Frequency 2x / week    OT Duration 6 weeks    OT Treatment/Interventions Self-care/ADL training;Moist Heat;Ultrasound;Cryotherapy;Electrical Stimulation;Paraffin;Neuromuscular education;Manual Therapy;Patient/family education;Passive range of motion;Therapeutic activities;DME and/or AE instruction    Plan P: Patient will beneft from skilled OT services to increase functional use of her RUE and return to using her right hand for all daily tasks with less discomfort and difficulty. Treatment plan; Initiate joint mobilization to increase wrist/forearm ROM, dorsal glides to increase wrist flexion, volar glides to increase extension, dorsal glides of radius on ulnar to increase supination. myofascial release, manual  stretching, A/ROM, strengthening, edema management (ice massage).    OT Home Exercise Plan eval: putty, supinationa forearm stretch    Consulted and Agree with Plan of Care Patient             Patient will benefit from skilled therapeutic intervention in order to improve the following deficits and impairments:   Body Structure / Function / Physical Skills: ADL, Strength, Pain, Edema, UE functional use, ROM, IADL, Fascial restriction, Mobility       Visit Diagnosis: Other symptoms and signs involving the musculoskeletal system  Pain in right wrist  Stiffness of right wrist, not elsewhere classified    Problem List Patient Active Problem List   Diagnosis Date Noted   Osteoporosis 01/06/2020   Fracture of left wrist with routine healing 10/04/2019   Fracture, Colles, left, closed 09/09/2019   S/P ORIF (open reduction internal fixation) fracture 09/05/2019 left wrist  09/09/2019   Left wrist fracture    Hypertension 09/02/2019   Ailene Ravel, OTR/L,CBIS  [id number]  01/05/2022, 9:29 PM  [place], [place], [address] Phone: [phone]   Fax:  [phone]  Name: Lucine CORBY VILLASENOR MRN: [id number] Date of Birth: [birth year]

## 2022-01-05 NOTE — Patient Instructions (Signed)
Theraputty Home Exercise Program  Complete 1-2 times a day.  putty squeeze  Pt. should squeeze putty in hand trying to keep it round by rotating putty after each squeeze. push fingers through putty to palm each time. Complete for __3-5____ minutes.   PUTTY KEY GRIP  Hold the putty at the top of your hand. Squeeze the putty between your thumb and the side of your 2nd finger as shown. Complete for ___3-5_____ minutes.    PUTTY 3 JAW CHUCK  Roll up some putty into a ball then flatten it. Then, firmly squeeze it with your first 3 fingers as shown. Complete for ___3-5___ minutes.      SUPINATION STRETCH  With bent elbows, grasp your affected hand from under the palm on the outside aspect of your target hand as shown. Then straighten your elbows to stretch your forearm Hold for 30 seconds. Complete 2 times.

## 2022-01-07 ENCOUNTER — Other Ambulatory Visit: Payer: Self-pay

## 2022-01-07 ENCOUNTER — Encounter (HOSPITAL_COMMUNITY): Payer: Self-pay | Admitting: Occupational Therapy

## 2022-01-07 ENCOUNTER — Ambulatory Visit (HOSPITAL_COMMUNITY): Payer: Medicare Other | Admitting: Occupational Therapy

## 2022-01-07 DIAGNOSIS — R29898 Other symptoms and signs involving the musculoskeletal system: Secondary | ICD-10-CM

## 2022-01-07 DIAGNOSIS — M25631 Stiffness of right wrist, not elsewhere classified: Secondary | ICD-10-CM

## 2022-01-07 DIAGNOSIS — M25531 Pain in right wrist: Secondary | ICD-10-CM

## 2022-01-07 NOTE — Therapy (Signed)
[facility] [address] [place], [place], [address] Phone: [phone]   Fax:  [phone]  Occupational Therapy Treatment  Patient Details  Name: Victoria Dominguez MRN: [id number] Date of Birth: 06-19-1951 Referring Provider (OT): Ailene Ards, MD   Encounter Date: 01/07/2022   OT End of Session - 01/07/22 1335     Visit Number 2    Number of Visits 12    Date for OT Re-Evaluation 02/16/22    Authorization Type 1) Medicare 2) [employer]    Progress Note Due on Visit 10    OT Start Time 1255    OT Stop Time 1335    OT Time Calculation (min) 40 min    Activity Tolerance Patient tolerated treatment well    Behavior During Therapy [facility] for tasks assessed/performed             Past Medical History:  Diagnosis Date   Hypertension     Past Surgical History:  Procedure Laterality Date   ABDOMINAL HYSTERECTOMY     CATARACT EXTRACTION W/PHACO Right 03/02/2021   Procedure: CATARACT EXTRACTION PHACO AND INTRAOCULAR LENS PLACEMENT ([place]) RIGHT 4.23 00:31.5;  Surgeon: Birder Robson, MD;  Location: [facility];  Service: Ophthalmology;  Laterality: Right;  COVID + 01-10-21   CATARACT EXTRACTION W/PHACO Left 03/16/2021   Procedure: CATARACT EXTRACTION PHACO AND INTRAOCULAR LENS PLACEMENT ([facility]) LEFT 7.06 00:40.7;  Surgeon: Birder Robson, MD;  Location: [facility];  Service: Ophthalmology;  Laterality: Left;   ORIF WRIST FRACTURE Left 09/05/2019   Procedure: OPEN REDUCTION INTERNAL FIXATION (ORIF) WRIST FRACTURE;  Surgeon: Carole Civil, MD;  Location: [facility];  Service: Orthopedics;  Laterality: Left;    There were no vitals filed for this visit.   Subjective Assessment - 01/07/22 1251     Subjective  S: It occasionally twinges.    Currently in Pain? No/denies                [facility] OT Assessment - 01/07/22 1251       Assessment   Medical Diagnosis ORIF right wrist      Precautions    Precautions Other (comment)    Precaution Comments Wearing a soft resting hand brace at night or when out in public for comfort. Standard protocol: continue with brace at night and in public. Wean from brace at 8 weeks. Begin progressive resistive exercises as tolerated.                      OT Treatments/Exercises (OP) - 01/07/22 1257       Exercises   Exercises Wrist;Hand      Weighted Stretch Over Towel Roll   Supination - Weighted Stretch 2 pounds;60 seconds      Wrist Exercises   Wrist Flexion AROM;10 reps    Wrist Extension AROM;10 reps    Other wrist exercises forearm supination, A/ROM, 10X    Other wrist exercises wrist flexion stretch with RUE extended, 2x20"; prayer stretch for wrist extension, 2x20"      Additional Wrist Exercises   Sponges Pt with forearm secure on table, reaching into wrist extension to stack 3 piles of 5 sponges, min difficulty    Hand Gripper with Large Beads all beads gripper at 35#, gripper vertical    Hand Gripper with Medium Beads all beads gripper at 29#, gripper vertical    Hand Gripper with Small Beads all beads gripper at 29#, gripper horizontal  Hand Exercises   Other Hand Exercises Squeezing graduated eggercizers, each color 5x      Manual Therapy   Manual Therapy Myofascial release;Passive ROM    Manual therapy comments completed seaparately from therapeutic exercises    Myofascial Release myofascial release to right volar and dorsal wrist and forearm regions to decrease pain and fascial restrictions increase joint ROM    Passive ROM sustained passive stretching to wrist flexion, extension, and supination to improve joint mobility for ROM                      OT Short Term Goals - 01/07/22 1251       OT SHORT TERM GOAL #1   Title Patient will be educated and independent with HEP in order to faciliate progress in therapy and allow her to return to using her right UE as her non dominant extremity for 75% or  more of daily tasks.    Time 3    Period Weeks    Status On-going    Target Date 01/26/22      OT SHORT TERM GOAL #2   Title Patient will increase right wrist and forearm P/ROM to [facility] in order to increase her ability to maage dishes while cooking or washing.    Time 3    Period Weeks    Status On-going      OT SHORT TERM GOAL #3   Title Patient will report a decrease in right wrist pain of approximately 5/10 or less when completing daily tasks.    Time 3    Period Weeks    Status On-going               OT Long Term Goals - 01/07/22 1251       OT LONG TERM GOAL #1   Title Patient will increase right wrist and hand A/ROM to [facility] in order to be able to stabilize containers and jars when attempting to open them.    Time 6    Period Weeks    Status On-going    Target Date 02/16/22      OT LONG TERM GOAL #2   Title Patient will increase her grip strength to 45# in order to return to holding onto items such as grocery bags without difficulty.    Time 6    Period Weeks    Status On-going      OT LONG TERM GOAL #3   Title Pt will increase her right wrist strength to 4/5 or better in order to return to holding and manipulating items of moderate weight with less difficulty.    Time 6    Period Weeks    Status On-going      OT LONG TERM GOAL #4   Title Pt will decrease her right wrist/forearm fascial restrictions to min amount or less in order to increase the functional mobility needed to complete basic household tasks such as bathing and dressing.    Time 6    Period Weeks    Status On-going                   Plan - 01/07/22 1319     Clinical Impression Statement A: Initiated myofascial release and passive stretching to right wrist and forearm regions to address fascial restrictions. Wrist and forearm A/ROM initiated as well as weighted supination stretch. Pt completing grip strengthening task using hand gripper and eggercizers. Verbal cuing for form, technique,  and speed.  Body Structure / Function / Physical Skills ADL;Strength;Pain;Edema;UE functional use;ROM;IADL;Fascial restriction;Mobility    Plan P: continue with myofascial release and passive stretching, add theraputty grip work    OT Home Exercise Plan eval: putty, supinationa forearm stretch    Consulted and Agree with Plan of Care Patient             Patient will benefit from skilled therapeutic intervention in order to improve the following deficits and impairments:   Body Structure / Function / Physical Skills: ADL, Strength, Pain, Edema, UE functional use, ROM, IADL, Fascial restriction, Mobility       Visit Diagnosis: Other symptoms and signs involving the musculoskeletal system  Pain in right wrist  Stiffness of right wrist, not elsewhere classified    Problem List Patient Active Problem List   Diagnosis Date Noted   Osteoporosis 01/06/2020   Fracture of left wrist with routine healing 10/04/2019   Fracture, [place], left, closed 09/09/2019   S/P ORIF (open reduction internal fixation) fracture 09/05/2019 left wrist  09/09/2019   Left wrist fracture    Hypertension 09/02/2019    Guadelupe Sabin, OTR/L  [phone] 01/07/2022, 1:36 PM  [place], [place], [address] Phone: [phone]   Fax:  [phone]  Name: Kama YANISA GOODGAME MRN: [id number] Date of Birth: [birth year]

## 2022-01-10 ENCOUNTER — Encounter (HOSPITAL_COMMUNITY): Payer: Self-pay | Admitting: Occupational Therapy

## 2022-01-10 ENCOUNTER — Ambulatory Visit (HOSPITAL_COMMUNITY): Payer: Medicare Other | Admitting: Occupational Therapy

## 2022-01-10 ENCOUNTER — Other Ambulatory Visit: Payer: Self-pay

## 2022-01-10 DIAGNOSIS — M25631 Stiffness of right wrist, not elsewhere classified: Secondary | ICD-10-CM

## 2022-01-10 DIAGNOSIS — M25531 Pain in right wrist: Secondary | ICD-10-CM

## 2022-01-10 DIAGNOSIS — R29898 Other symptoms and signs involving the musculoskeletal system: Secondary | ICD-10-CM

## 2022-01-10 NOTE — Therapy (Signed)
[facility] [address] [place], [place], [address] Phone: [phone]   Fax:  [phone]  Occupational Therapy Treatment  Patient Details  Name: Victoria Dominguez MRN: [id number] Date of Birth: 19-Jan-1951 Referring Provider (OT): Ailene Ards, MD   Encounter Date: 01/10/2022   OT End of Session - 01/10/22 1607     Visit Number 3    Number of Visits 12    Date for OT Re-Evaluation 02/16/22    Authorization Type 1) Medicare 2) [employer]    Progress Note Due on Visit 10    OT Start Time 1517    OT Stop Time 1558    OT Time Calculation (min) 41 min    Activity Tolerance Patient tolerated treatment well    Behavior During Therapy [facility] for tasks assessed/performed             Past Medical History:  Diagnosis Date   Hypertension     Past Surgical History:  Procedure Laterality Date   ABDOMINAL HYSTERECTOMY     CATARACT EXTRACTION W/PHACO Right 03/02/2021   Procedure: CATARACT EXTRACTION PHACO AND INTRAOCULAR LENS PLACEMENT ([place]) RIGHT 4.23 00:31.5;  Surgeon: Birder Robson, MD;  Location: [facility];  Service: Ophthalmology;  Laterality: Right;  COVID + 01-10-21   CATARACT EXTRACTION W/PHACO Left 03/16/2021   Procedure: CATARACT EXTRACTION PHACO AND INTRAOCULAR LENS PLACEMENT ([facility]) LEFT 7.06 00:40.7;  Surgeon: Birder Robson, MD;  Location: [facility];  Service: Ophthalmology;  Laterality: Left;   ORIF WRIST FRACTURE Left 09/05/2019   Procedure: OPEN REDUCTION INTERNAL FIXATION (ORIF) WRIST FRACTURE;  Surgeon: Carole Civil, MD;  Location: [facility];  Service: Orthopedics;  Laterality: Left;    There were no vitals filed for this visit.   Subjective Assessment - 01/10/22 1518     Subjective  S: 2 to 3, that's it.    Currently in Pain? Yes    Pain Score 3     Pain Location Wrist    Pain Orientation Right    Pain Descriptors / Indicators Burning;Other (Comment)   stings, iteches and  burns.   Pain Type Surgical pain    Pain Radiating Towards Radiates up to neck.    Pain Onset More than a month ago    Pain Frequency Occasional    Aggravating Factors  Moving a certain way.    Pain Relieving Factors nothing    Effect of Pain on Daily Activities moderate    Multiple Pain Sites No                [facility] OT Assessment - 01/10/22 0001       Assessment   Medical Diagnosis ORIF right wrist      Precautions   Precautions Other (comment)    Precaution Comments Wearing a soft resting hand brace at night or when out in public for comfort. Standard protocol: continue with brace at night and in public. Wean from brace at 8 weeks. Begin progressive resistive exercises as tolerated.                      OT Treatments/Exercises (OP) - 01/10/22 0001       Exercises   Exercises Wrist;Hand;Theraputty      Weighted Stretch Over Towel Roll   Supination - Weighted Stretch 2 pounds;60 seconds   x2 reps     Wrist Exercises   Other wrist exercises forearm supination, A/ROM, 10X    Other wrist  exercises wrist flexion stretch with RUE extended, 2x20"; prayer stretch for wrist extension, 2x20"    Card throwing over bolster focusing on wrist flexion to grasp the cards and extension to release when throwig. x2 reps completed      Additional Wrist Exercises   Hand Gripper with Large Beads all beads gripper at 35#, gripper vertical   Tried 37 but pt had too much difficulty.   Hand Gripper with Medium Beads all beads gripper at 35#, gripper vertical    Hand Gripper with Small Beads all beads gripper at 35#, gripper vertically      Theraputty   Theraputty - Roll yellow    Theraputty - Grip yellow   pronated and supinated     Manual Therapy   Manual Therapy Myofascial release;Passive ROM    Manual therapy comments completed seaparately from therapeutic exercises    Myofascial Release myofascial release to right volar and dorsal wrist and forearm regions to decrease  pain and fascial restrictions increase joint ROM    Passive ROM sustained passive stretching to wrist flexion, extension, and supination to improve joint mobility for ROM                      OT Short Term Goals - 01/07/22 1251       OT SHORT TERM GOAL #1   Title Patient will be educated and independent with HEP in order to faciliate progress in therapy and allow her to return to using her right UE as her non dominant extremity for 75% or more of daily tasks.    Time 3    Period Weeks    Status On-going    Target Date 01/26/22      OT SHORT TERM GOAL #2   Title Patient will increase right wrist and forearm P/ROM to [facility] in order to increase her ability to maage dishes while cooking or washing.    Time 3    Period Weeks    Status On-going      OT SHORT TERM GOAL #3   Title Patient will report a decrease in right wrist pain of approximately 5/10 or less when completing daily tasks.    Time 3    Period Weeks    Status On-going               OT Long Term Goals - 01/07/22 1251       OT LONG TERM GOAL #1   Title Patient will increase right wrist and hand A/ROM to [facility] in order to be able to stabilize containers and jars when attempting to open them.    Time 6    Period Weeks    Status On-going    Target Date 02/16/22      OT LONG TERM GOAL #2   Title Patient will increase her grip strength to 45# in order to return to holding onto items such as grocery bags without difficulty.    Time 6    Period Weeks    Status On-going      OT LONG TERM GOAL #3   Title Pt will increase her right wrist strength to 4/5 or better in order to return to holding and manipulating items of moderate weight with less difficulty.    Time 6    Period Weeks    Status On-going      OT LONG TERM GOAL #4   Title Pt will decrease her right wrist/forearm fascial restrictions to min amount or  less in order to increase the functional mobility needed to complete basic household tasks such as  bathing and dressing.    Time 6    Period Weeks    Status On-going                   Plan - 01/10/22 1609     Clinical Impression Statement A: Continued myofascial release and passive stretching followed by wirst strengthening with putty and A/ROM with novel card tossing game. Pt also completed weighted supination stretch and all gripper. Pt was able to progress to 35# with each size of bead today while keeping the gripper veritcal. VC for form and technique.    Body Structure / Function / Physical Skills ADL;Strength;Pain;Edema;UE functional use;ROM;IADL;Fascial restriction;Mobility    Plan P: continue with myofascial release and passive stretching, continue theraputty grip work and girp strengthening as well as focus on flexion and extension A/ROM limitations.    OT Home Exercise Plan eval: putty, supinationa forearm stretch    Consulted and Agree with Plan of Care Patient             Patient will benefit from skilled therapeutic intervention in order to improve the following deficits and impairments:   Body Structure / Function / Physical Skills: ADL, Strength, Pain, Edema, UE functional use, ROM, IADL, Fascial restriction, Mobility       Visit Diagnosis: Other symptoms and signs involving the musculoskeletal system  Pain in right wrist  Stiffness of right wrist, not elsewhere classified    Problem List Patient Active Problem List   Diagnosis Date Noted   Osteoporosis 01/06/2020   Fracture of left wrist with routine healing 10/04/2019   Fracture, [place], left, closed 09/09/2019   S/P ORIF (open reduction internal fixation) fracture 09/05/2019 left wrist  09/09/2019   Left wrist fracture    Hypertension 09/02/2019   Larey Seat OT, MOT  Larey Seat, OT 01/10/2022, 4:18 PM  [facility], [place], [address] Phone: [phone]   Fax:  [phone]  Name: Victoria Dominguez MRN:  [id number] Date of Birth: [birth year]

## 2022-01-12 ENCOUNTER — Other Ambulatory Visit: Payer: Self-pay

## 2022-01-12 ENCOUNTER — Ambulatory Visit (HOSPITAL_COMMUNITY): Payer: Medicare Other

## 2022-01-12 ENCOUNTER — Encounter (HOSPITAL_COMMUNITY): Payer: Self-pay

## 2022-01-12 DIAGNOSIS — M25531 Pain in right wrist: Secondary | ICD-10-CM

## 2022-01-12 DIAGNOSIS — M25631 Stiffness of right wrist, not elsewhere classified: Secondary | ICD-10-CM

## 2022-01-12 DIAGNOSIS — R29898 Other symptoms and signs involving the musculoskeletal system: Secondary | ICD-10-CM | POA: Diagnosis not present

## 2022-01-12 NOTE — Therapy (Signed)
[facility] [address] [place], [place], [address] Phone: [phone]   Fax:  [phone]  Occupational Therapy Treatment  Patient Details  Name: Victoria Dominguez MRN: [id number] Date of Birth: 05-29-1951 Referring Provider (OT): Ailene Ards, MD   Encounter Date: 01/12/2022   OT End of Session - 01/12/22 1055     Visit Number 4    Number of Visits 12    Date for OT Re-Evaluation 02/16/22    Authorization Type 1) Medicare 2) [employer]    Progress Note Due on Visit 10    OT Start Time 0945    OT Stop Time 1025    OT Time Calculation (min) 40 min    Activity Tolerance Patient tolerated treatment well    Behavior During Therapy [facility] for tasks assessed/performed             Past Medical History:  Diagnosis Date   Hypertension     Past Surgical History:  Procedure Laterality Date   ABDOMINAL HYSTERECTOMY     CATARACT EXTRACTION W/PHACO Right 03/02/2021   Procedure: CATARACT EXTRACTION PHACO AND INTRAOCULAR LENS PLACEMENT ([place]) RIGHT 4.23 00:31.5;  Surgeon: Birder Robson, MD;  Location: [facility];  Service: Ophthalmology;  Laterality: Right;  COVID + 01-10-21   CATARACT EXTRACTION W/PHACO Left 03/16/2021   Procedure: CATARACT EXTRACTION PHACO AND INTRAOCULAR LENS PLACEMENT ([facility]) LEFT 7.06 00:40.7;  Surgeon: Birder Robson, MD;  Location: [facility];  Service: Ophthalmology;  Laterality: Left;   ORIF WRIST FRACTURE Left 09/05/2019   Procedure: OPEN REDUCTION INTERNAL FIXATION (ORIF) WRIST FRACTURE;  Surgeon: Carole Civil, MD;  [facility]: [facility];  Service: Orthopedics;  Laterality: Left;    There were no vitals filed for this visit.   Subjective Assessment - 01/12/22 1322     Subjective  S: Stength and ROM together are still things I need to work on.    Currently in Pain? No/denies                [facility] OT Assessment - 01/12/22 0949       Assessment   Medical Diagnosis ORIF  right wrist      Precautions   Precautions Other (comment)    Precaution Comments Wearing a soft resting hand brace at night or when out in public for comfort. Standard protocol: continue with brace at night and in public. Wean from brace at 8 weeks. Begin progressive resistive exercises as tolerated.                      OT Treatments/Exercises (OP) - 01/12/22 0949       Exercises   Exercises Wrist;Hand;Theraputty      Hand Exercises   Other Hand Exercises Utilized PVC pipe to press circles into flattened yellow putty focusing on wrist flexion/extension and hand strength.      Theraputty   Theraputty - Flatten yellow -standing and sitting    Theraputty - Roll yellow    Theraputty - Grip yellow   pronation and supination   Theraputty - Pinch lateral and 3 point - yellow    Theraputty Hand- Locate Pegs 8/8 beads located - yellow      Manual Therapy   Manual Therapy Myofascial release    Manual therapy comments completed seaparately from therapeutic exercises    Myofascial Release myofascial release to right volar and dorsal wrist and forearm regions to decrease pain and fascial restrictions increase joint ROM  Passive ROM Completed dynamic stretching of wrist for pronation and supination during myofascial release. Massage cream used.                      OT Short Term Goals - 01/07/22 1251       OT SHORT TERM GOAL #1   Title Patient will be educated and independent with HEP in order to faciliate progress in therapy and allow her to return to using her right UE as her non dominant extremity for 75% or more of daily tasks.    Time 3    Period Weeks    Status On-going    Target Date 01/26/22      OT SHORT TERM GOAL #2   Title Patient will increase right wrist and forearm P/ROM to [facility] in order to increase her ability to maage dishes while cooking or washing.    Time 3    Period Weeks    Status On-going      OT SHORT TERM GOAL #3   Title Patient  will report a decrease in right wrist pain of approximately 5/10 or less when completing daily tasks.    Time 3    Period Weeks    Status On-going               OT Long Term Goals - 01/07/22 1251       OT LONG TERM GOAL #1   Title Patient will increase right wrist and hand A/ROM to [facility] in order to be able to stabilize containers and jars when attempting to open them.    Time 6    Period Weeks    Status On-going    Target Date 02/16/22      OT LONG TERM GOAL #2   Title Patient will increase her grip strength to 45# in order to return to holding onto items such as grocery bags without difficulty.    Time 6    Period Weeks    Status On-going      OT LONG TERM GOAL #3   Title Pt will increase her right wrist strength to 4/5 or better in order to return to holding and manipulating items of moderate weight with less difficulty.    Time 6    Period Weeks    Status On-going      OT LONG TERM GOAL #4   Title Pt will decrease her right wrist/forearm fascial restrictions to min amount or less in order to increase the functional mobility needed to complete basic household tasks such as bathing and dressing.    Time 6    Period Weeks    Status On-going                   Plan - 01/12/22 1056     Clinical Impression Statement A: manual techniques completed to address fascial restrictions and limited joint. Dynamic progressive stretch completed during soft tissue mobilization to increase supination ROM. VC for form and technique especially paying attention to elbow position during supination attempt.    Body Structure / Function / Physical Skills ADL;Strength;Pain;Edema;UE functional use;ROM;IADL;Fascial restriction;Mobility    Plan P: continue with passive dynamic streching for supination. Work on flexion and extension A/ROM activities.    Consulted and Agree with Plan of Care Patient             Patient will benefit from skilled therapeutic intervention in order to  improve the following deficits and impairments:  Body Structure / Function / Physical Skills: ADL, Strength, Pain, Edema, UE functional use, ROM, IADL, Fascial restriction, Mobility       Visit Diagnosis: Pain in right wrist  Stiffness of right wrist, not elsewhere classified  Other symptoms and signs involving the musculoskeletal system    Problem List Patient Active Problem List   Diagnosis Date Noted   Osteoporosis 01/06/2020   Fracture of left wrist with routine healing 10/04/2019   Fracture, [place], left, closed 09/09/2019   S/P ORIF (open reduction internal fixation) fracture 09/05/2019 left wrist  09/09/2019   Left wrist fracture    Hypertension 09/02/2019    Ailene Ravel, OTR/L,CBIS  [phone]  01/12/2022, 1:54 PM  [facility], [place], [address] Phone: [phone]   Fax:  [phone]  Name: Victoria Dominguez MRN: [id number] Date of Birth: 1951/10/17

## 2022-01-18 ENCOUNTER — Encounter (HOSPITAL_COMMUNITY): Payer: Self-pay | Admitting: Occupational Therapy

## 2022-01-18 ENCOUNTER — Other Ambulatory Visit: Payer: Self-pay

## 2022-01-18 ENCOUNTER — Ambulatory Visit (HOSPITAL_COMMUNITY): Payer: Medicare Other | Admitting: Occupational Therapy

## 2022-01-18 DIAGNOSIS — R29898 Other symptoms and signs involving the musculoskeletal system: Secondary | ICD-10-CM | POA: Diagnosis not present

## 2022-01-18 DIAGNOSIS — M25631 Stiffness of right wrist, not elsewhere classified: Secondary | ICD-10-CM

## 2022-01-18 DIAGNOSIS — M25531 Pain in right wrist: Secondary | ICD-10-CM

## 2022-01-18 NOTE — Therapy (Signed)
[facility] [address] [place], [place], [address] Phone: [phone]   Fax:  [phone]  Occupational Therapy Treatment  Patient Details  Name: Victoria Dominguez MRN: [id number] Date of Birth: [birth year] Referring Provider (OT): Ailene Ards, MD   Encounter Date: 01/18/2022   OT End of Session - 01/18/22 1151     Visit Number 5    Number of Visits 12    Date for OT Re-Evaluation 02/16/22    Authorization Type 1) Medicare 2) [employer] medicare    Progress Note Due on Visit 10    OT Start Time 1117    OT Stop Time 1158    OT Time Calculation (min) 41 min    Activity Tolerance Patient tolerated treatment well    Behavior During Therapy [facility] for tasks assessed/performed             Past Medical History:  Diagnosis Date   Hypertension     Past Surgical History:  Procedure Laterality Date   ABDOMINAL HYSTERECTOMY     CATARACT EXTRACTION W/PHACO Right 03/02/2021   Procedure: CATARACT EXTRACTION PHACO AND INTRAOCULAR LENS PLACEMENT (IOC) RIGHT 4.23 00:31.5;  Surgeon: Birder Robson, MD;  Location: [place];  Service: Ophthalmology;  Laterality: Right;  COVID + 01-10-21   CATARACT EXTRACTION W/PHACO Left 03/16/2021   Procedure: CATARACT EXTRACTION PHACO AND INTRAOCULAR LENS PLACEMENT (IOC) LEFT 7.06 00:40.7;  Surgeon: Birder Robson, MD;  Location: [place];  Service: Ophthalmology;  Laterality: Left;   ORIF WRIST FRACTURE Left 09/05/2019   Procedure: OPEN REDUCTION INTERNAL FIXATION (ORIF) WRIST FRACTURE;  Surgeon: Carole Civil, MD;  [facility]: [facility];  Service: Orthopedics;  Laterality: Left;    There were no vitals filed for this visit.   Subjective Assessment - 01/18/22 1114     Subjective  S: It hurt some on Sunday but it's fine now.    Currently in Pain? No/denies                          OT Treatments/Exercises (OP) - 01/18/22 1117       Exercises   Exercises  Wrist;Hand;Theraputty      Weighted Stretch Over Towel Roll   Supination - Weighted Stretch 3 pounds;60 seconds   2x     Wrist Exercises   Wrist Flexion AROM;15 reps    Wrist Extension AROM;15 reps    Other wrist exercises forearm supination, A/ROM, 15X    Other wrist exercises velcro board: supination, 3x, wide velcro strip      Additional Wrist Exercises   Hand Gripper with Large Beads all beads gripper at 37#, gripper vertical    Hand Gripper with Medium Beads all beads gripper at 35#, gripper vertical    Hand Gripper with Small Beads all beads gripper at 35#, gripper vertical      Hand Exercises   Other Hand Exercises Pinch tree: Pt holding clothespin using lateral pinch and turning forearm to supination for placing pins along vertical bar.      Theraputty   Theraputty - Roll red    Theraputty - Grip red-supinated    Theraputty - Pinch lateral and 3 point -red, supinated      Manual Therapy   Manual Therapy Myofascial release    Manual therapy comments completed seaparately from therapeutic exercises    Myofascial Release myofascial release to right volar and dorsal wrist and forearm regions to decrease pain and fascial  restrictions increase joint ROM    Passive ROM Completed dynamic stretching of wrist for pronation and supination during myofascial release. Massage cream used.                      OT Short Term Goals - 01/07/22 1251       OT SHORT TERM GOAL #1   Title Patient will be educated and independent with HEP in order to faciliate progress in therapy and allow her to return to using her right UE as her non dominant extremity for 75% or more of daily tasks.    Time 3    Period Weeks    Status On-going    Target Date 01/26/22      OT SHORT TERM GOAL #2   Title Patient will increase right wrist and forearm P/ROM to [facility] in order to increase her ability to maage dishes while cooking or washing.    Time 3    Period Weeks    Status On-going      OT  SHORT TERM GOAL #3   Title Patient will report a decrease in right wrist pain of approximately 5/10 or less when completing daily tasks.    Time 3    Period Weeks    Status On-going               OT Long Term Goals - 01/07/22 1251       OT LONG TERM GOAL #1   Title Patient will increase right wrist and hand A/ROM to [facility] in order to be able to stabilize containers and jars when attempting to open them.    Time 6    Period Weeks    Status On-going    Target Date 02/16/22      OT LONG TERM GOAL #2   Title Patient will increase her grip strength to 45# in order to return to holding onto items such as grocery bags without difficulty.    Time 6    Period Weeks    Status On-going      OT LONG TERM GOAL #3   Title Pt will increase her right wrist strength to 4/5 or better in order to return to holding and manipulating items of moderate weight with less difficulty.    Time 6    Period Weeks    Status On-going      OT LONG TERM GOAL #4   Title Pt will decrease her right wrist/forearm fascial restrictions to min amount or less in order to increase the functional mobility needed to complete basic household tasks such as bathing and dressing.    Time 6    Period Weeks    Status On-going                   Plan - 01/18/22 1149     Clinical Impression Statement A: Continued with manual techniques to address fascial restrictions. Passive stretching completed, A/ROM for wrist and forearm. Increased weight to 3# for weighted supination stretch. Added pinch tree, velcro board, and red theraputty with focus on supination during tasks. Also increased putty to red versus yellow. Verbal cuing for form and technique.    Body Structure / Function / Physical Skills ADL;Strength;Pain;Edema;UE functional use;ROM;IADL;Fascial restriction;Mobility    Plan P: continue with passive dynamic streching for supination. Add low weight to wrist A/ROM    OT Home Exercise Plan eval: putty,  supinationa forearm stretch    Consulted and Agree with Plan  of Care Patient             Patient will benefit from skilled therapeutic intervention in order to improve the following deficits and impairments:   Body Structure / Function / Physical Skills: ADL, Strength, Pain, Edema, UE functional use, ROM, IADL, Fascial restriction, Mobility       Visit Diagnosis: Pain in right wrist  Stiffness of right wrist, not elsewhere classified  Other symptoms and signs involving the musculoskeletal system    Problem List Patient Active Problem List   Diagnosis Date Noted   Osteoporosis 01/06/2020   Fracture of left wrist with routine healing 10/04/2019   Fracture, Colles, left, closed 09/09/2019   S/P ORIF (open reduction internal fixation) fracture 09/05/2019 left wrist  09/09/2019   Left wrist fracture    Hypertension 09/02/2019    Guadelupe Sabin, OTR/L  [phone] 01/18/2022, 12:01 PM  [facility] [address] [place], [place], [address] Phone: [phone]   Fax:  [phone]  Name: Victoria Dominguez MRN: [id number] Date of Birth: 1951-10-10

## 2022-01-20 ENCOUNTER — Ambulatory Visit (HOSPITAL_COMMUNITY): Payer: Medicare Other | Attending: Orthopedic Surgery

## 2022-01-20 ENCOUNTER — Other Ambulatory Visit: Payer: Self-pay

## 2022-01-20 ENCOUNTER — Encounter (HOSPITAL_COMMUNITY): Payer: Self-pay

## 2022-01-20 DIAGNOSIS — R29898 Other symptoms and signs involving the musculoskeletal system: Secondary | ICD-10-CM | POA: Diagnosis present

## 2022-01-20 DIAGNOSIS — M25631 Stiffness of right wrist, not elsewhere classified: Secondary | ICD-10-CM | POA: Insufficient documentation

## 2022-01-20 DIAGNOSIS — M25531 Pain in right wrist: Secondary | ICD-10-CM | POA: Diagnosis present

## 2022-01-20 NOTE — Patient Instructions (Signed)
? ? ?  Strengthening Exercises ? ?1) WRIST EXTENSION CURLS - TABLE ? ?Hold a small free weight, rest your forearm on a table and bend your wrist up and down with your palm face down as shown. ? ? ? ? ? ?2) WRIST FLEXION CURLS - TABLE ? ?Hold a small free weight, rest your forearm on a table and bend your wrist up and down with your palm face up as shown. ? ? ? ? ?3) FREE WEIGHT RADIAL/ULNAR DEVIATION - TABLE ? ?Hold a small free weight, rest your forearm on a table and bend your wrist up and down with your palm facing towards the side as shown. ? ? ? ? ?4) Pronation ? ?Forearm supported on table with wrist in neutral position. Using a weight, roll wrist so that palm faces downward. Hold for 2 seconds and return to starting position. ? ? ? ? ?5) Supination ? ?Forearm supported on table with wrist in neutral position. Using a weight, roll wrist so that palm is now facing upward. Hold for 2 seconds and return to starting position.  ? ? ? ? ?*Complete exercises using __2__ pound weight, _10___times each, __1-2__times per day*  ?

## 2022-01-20 NOTE — Therapy (Signed)
[facility] ?[address] ?[place], [place], [address] ?Phone: [phone]   Fax:  [phone] ? ?Occupational Therapy Treatment ? ?Patient Details  ?Name: Victoria Dominguez ?MRN: [id number] ?Date of Birth: 1951-05-30 ?Referring Provider (OT): Ailene Ards, MD ? ? ?Encounter Date: 01/20/2022 ? ? OT End of Session - 01/20/22 1429   ? ? Visit Number 6   ? Number of Visits 12   ? Date for OT Re-Evaluation 02/16/22   ? Authorization Type 1) Medicare 2) [employer]   ? Progress Note Due on Visit 10   ? OT Start Time 1345   ? OT Stop Time 1423   ? OT Time Calculation (min) 38 min   ? Activity Tolerance Patient tolerated treatment well   ? Behavior During Therapy [facility] for tasks assessed/performed   ? ?  ?  ? ?  ? ? ?Past Medical History:  ?Diagnosis Date  ? Hypertension   ? ? ?Past Surgical History:  ?Procedure Laterality Date  ? ABDOMINAL HYSTERECTOMY    ? CATARACT EXTRACTION W/PHACO Right 03/02/2021  ? Procedure: CATARACT EXTRACTION PHACO AND INTRAOCULAR LENS PLACEMENT (IOC) RIGHT 4.23 00:31.5;  Surgeon: Birder Robson, MD;  Location: [place];  Service: Ophthalmology;  Laterality: Right;  COVID + 01-10-21  ? CATARACT EXTRACTION W/PHACO Left 03/16/2021  ? Procedure: CATARACT EXTRACTION PHACO AND INTRAOCULAR LENS PLACEMENT (IOC) LEFT 7.06 00:40.7;  Surgeon: Birder Robson, MD;  Location: [place];  Service: Ophthalmology;  Laterality: Left;  ? ORIF WRIST FRACTURE Left 09/05/2019  ? Procedure: OPEN REDUCTION INTERNAL FIXATION (ORIF) WRIST FRACTURE;  Surgeon: Carole Civil, MD;  Location: [facility];  Service: Orthopedics;  Laterality: Left;  ? ? ?There were no vitals filed for this visit. ? ? ? ? ? ? [facility] OT Assessment - 01/20/22 1358   ? ?  ? Assessment  ? Medical Diagnosis ORIF right wrist   ?  ? Precautions  ? Precautions Other (comment)   ? Precaution Comments Wearing a soft resting hand brace at night or when out in public for comfort.  Standard protocol: continue with brace at night and in public. Wean from brace at 8 weeks. Begin progressive resistive exercises as tolerated.   ? ?  ?  ? ?  ? ? ? ? ? ? ? ? ? ? ? OT Treatments/Exercises (OP) - 01/20/22 1357   ? ?  ? Exercises  ? Exercises Wrist;Hand;Theraputty   ?  ? Wrist Exercises  ? Other wrist exercises extension, radial deviation, unlar deviation, supination/pronation 10X, 2#   ?  ? Additional Wrist Exercises  ? Hand Gripper with Large Beads all beads gripper at 37#, gripper vertical   ? Hand Gripper with Medium Beads all beads gripper at 37#, gripper vertical   ? Hand Gripper with Small Beads all beads gripper at 37#, gripper vertical   ?  ? Hand Exercises  ? Other Hand Exercises With red rolled putty completed resisted supination 4-5X   ?  ? Theraputty  ? Theraputty - Flatten red- standing   ? Theraputty - Roll red   ?  ? Manual Therapy  ? Manual Therapy Myofascial release   ? Manual therapy comments completed seaparately from therapeutic exercises   ? Myofascial Release myofascial release to right volar and dorsal wrist and forearm regions to decrease pain and fascial restrictions increase joint ROM   ? Passive ROM Completed dynamic stretching of wrist for pronation and supination during myofascial release.   ? ?  ?  ? ?  ? ? ? ? ? ? ? ? ?  OT Education - 01/20/22 1428   ? ? Education Details wrist strengthening. red putty   ? Person(s) Educated Patient   ? Methods Explanation;Demonstration;Verbal cues;Handout   ? Comprehension Verbalized understanding   ? ?  ?  ? ?  ? ? ? OT Short Term Goals - 01/07/22 1251   ? ?  ? OT SHORT TERM GOAL #1  ? Title Patient will be educated and independent with HEP in order to faciliate progress in therapy and allow her to return to using her right UE as her non dominant extremity for 75% or more of daily tasks.   ? Time 3   ? Period Weeks   ? Status On-going   ? Target Date 01/26/22   ?  ? OT SHORT TERM GOAL #2  ? Title Patient will increase right wrist and  forearm P/ROM to [facility] in order to increase her ability to maage dishes while cooking or washing.   ? Time 3   ? Period Weeks   ? Status On-going   ?  ? OT SHORT TERM GOAL #3  ? Title Patient will report a decrease in right wrist pain of approximately 5/10 or less when completing daily tasks.   ? Time 3   ? Period Weeks   ? Status On-going   ? ?  ?  ? ?  ? ? ? ? OT Long Term Goals - 01/07/22 1251   ? ?  ? OT LONG TERM GOAL #1  ? Title Patient will increase right wrist and hand A/ROM to [facility] in order to be able to stabilize containers and jars when attempting to open them.   ? Time 6   ? Period Weeks   ? Status On-going   ? Target Date 02/16/22   ?  ? OT LONG TERM GOAL #2  ? Title Patient will increase her grip strength to 45# in order to return to holding onto items such as grocery bags without difficulty.   ? Time 6   ? Period Weeks   ? Status On-going   ?  ? OT LONG TERM GOAL #3  ? Title Pt will increase her right wrist strength to 4/5 or better in order to return to holding and manipulating items of moderate weight with less difficulty.   ? Time 6   ? Period Weeks   ? Status On-going   ?  ? OT LONG TERM GOAL #4  ? Title Pt will decrease her right wrist/forearm fascial restrictions to min amount or less in order to increase the functional mobility needed to complete basic household tasks such as bathing and dressing.   ? Time 6   ? Period Weeks   ? Status On-going   ? ?  ?  ? ?  ? ? ? ? ? ? ? ? Plan - 01/20/22 1429   ? ? Clinical Impression Statement A: Myofascial release completed along healed incision to decrease scar tissue and restrictions. Completed wrist strengthening with 2# weight while providing for HEP. Able to complete handgripper task with resistance set at 37# for all sizes.   ? Body Structure / Function / Physical Skills ADL;Strength;Pain;Edema;UE functional use;ROM;IADL;Fascial restriction;Mobility   ? Plan P: measure for MD appointment. Provide theraband (red) for resisted supination movement.  Discharge if recommended.   ? Consulted and Agree with Plan of Care Patient   ? ?  ?  ? ?  ? ? ?Patient will benefit from skilled therapeutic intervention in order to improve  the following deficits and impairments:   ?Body Structure / Function / Physical Skills: ADL, Strength, Pain, Edema, UE functional use, ROM, IADL, Fascial restriction, Mobility ?  ?  ? ? ?Visit Diagnosis: ?Pain in right wrist ? ?Stiffness of right wrist, not elsewhere classified ? ?Other symptoms and signs involving the musculoskeletal system ? ? ? ?Problem List ?Patient Active Problem List  ? Diagnosis Date Noted  ? Osteoporosis 01/06/2020  ? Fracture of left wrist with routine healing 10/04/2019  ? Fracture, Colles, left, closed 09/09/2019  ? S/P ORIF (open reduction internal fixation) fracture 09/05/2019 left wrist  09/09/2019  ? Left wrist fracture   ? Hypertension 09/02/2019  ? ? ?Ailene Ravel, OTR/L,CBIS  ?[phone] ? ?01/20/2022, 2:32 PM ? ?[facility] ?[address] ?[place], [place], [address] ?Phone: [phone]   Fax:  [phone] ? ?Name: Victoria Dominguez ?MRN: [id number] ?Date of Birth: 1951/10/09 ? ?

## 2022-01-25 ENCOUNTER — Other Ambulatory Visit: Payer: Self-pay

## 2022-01-25 ENCOUNTER — Encounter (HOSPITAL_COMMUNITY): Payer: Self-pay

## 2022-01-25 ENCOUNTER — Ambulatory Visit (HOSPITAL_COMMUNITY): Payer: Medicare Other

## 2022-01-25 DIAGNOSIS — M25531 Pain in right wrist: Secondary | ICD-10-CM

## 2022-01-25 DIAGNOSIS — M25631 Stiffness of right wrist, not elsewhere classified: Secondary | ICD-10-CM

## 2022-01-25 DIAGNOSIS — R29898 Other symptoms and signs involving the musculoskeletal system: Secondary | ICD-10-CM

## 2022-01-25 NOTE — Patient Instructions (Signed)
Using red Resistance band. 10-12 times. 1-2 times a day.  ? ?Wrist supination: ? ?[email or web address] ? ? ?Wrist Pronation (example of movement): ? ?[email or web address] ?

## 2022-01-25 NOTE — Therapy (Signed)
[facility] [address] [place], [place], [address] Phone: [phone]   Fax:  [phone]  Occupational Therapy Treatment  Patient Details  Name: Victoria Dominguez MRN: [id number] Date of Birth: 04-Sep-1951 Referring Provider (OT): Ailene Ards, MD   Encounter Date: 01/25/2022   OT End of Session - 01/25/22 1255     Visit Number 7    Number of Visits 12    Date for OT Re-Evaluation 02/16/22    Authorization Type 1) Medicare 2) [employer]    Progress Note Due on Visit 10    OT Start Time 1115   reassess/discharge   OT Stop Time 1153    OT Time Calculation (min) 38 min    Activity Tolerance Patient tolerated treatment well    Behavior During Therapy [facility] for tasks assessed/performed             Past Medical History:  Diagnosis Date   Hypertension     Past Surgical History:  Procedure Laterality Date   ABDOMINAL HYSTERECTOMY     CATARACT EXTRACTION W/PHACO Right 03/02/2021   Procedure: CATARACT EXTRACTION PHACO AND INTRAOCULAR LENS PLACEMENT ([place]) RIGHT 4.23 00:31.5;  Surgeon: Birder Robson, MD;  Location: [place];  Service: Ophthalmology;  Laterality: Right;  COVID + 01-10-21   CATARACT EXTRACTION W/PHACO Left 03/16/2021   Procedure: CATARACT EXTRACTION PHACO AND INTRAOCULAR LENS PLACEMENT (IOC) LEFT 7.06 00:40.7;  Surgeon: Birder Robson, MD;  Location: [place];  Service: Ophthalmology;  Laterality: Left;   ORIF WRIST FRACTURE Left 09/05/2019   Procedure: OPEN REDUCTION INTERNAL FIXATION (ORIF) WRIST FRACTURE;  Surgeon: Carole Civil, MD;  [facility]: [facility];  Service: Orthopedics;  Laterality: Left;    There were no vitals filed for this visit.   Subjective Assessment - 01/25/22 1253     Subjective  S: I will have pain with this motion using the weight but it's nothing I can't handle (2# wrist extension)    Currently in Pain? No/denies                [facility] OT Assessment  - 01/25/22 1113       Assessment   Medical Diagnosis ORIF right wrist      Precautions   Precautions Other (comment)    Precaution Comments Wearing a soft resting hand brace at night or when out in public for comfort. Standard protocol: continue with brace at night and in public. Wean from brace at 8 weeks. Begin progressive resistive exercises as tolerated.      AROM   Overall AROM Comments Assessed seated. Wrist measured in gravity eliminated    AROM Assessment Site Forearm;Wrist    Right Forearm Supination 60 Degrees   previous: 48   Right/Left Wrist Right    Right Wrist Extension 64 Degrees   previous: 50   Right Wrist Flexion 34 Degrees   previous: same   Right Wrist Ulnar Deviation 34 Degrees   previous: 10     Strength   Overall Strength Comments Assessed wrist strength for the first time today.    Strength Assessment Site Wrist    Right/Left Wrist Right    Right Wrist Flexion 5/5    Right Wrist Extension 5/5    Right Wrist Radial Deviation 5/5    Right Wrist Ulnar Deviation 5/5    Right/Left hand Right    Right Hand Grip (lbs) 40   previous: 25   Right Hand Lateral Pinch 13 lbs  previous: same (WNL)   Right Hand 3 Point Pinch 11 lbs   previous: 10               Quick Dash - 01/25/22 0001     Open a tight or new jar Moderate difficulty    Do heavy household chores (wash walls, wash floors) Mild difficulty    Carry a shopping bag or briefcase Mild difficulty    Wash your back Mild difficulty    Use a knife to cut food No difficulty    Recreational activities in which you take some force or impact through your arm, shoulder, or hand (golf, hammering, tennis) No difficulty    During the past week, to what extent has your arm, shoulder or hand problem interfered with your normal social activities with family, friends, neighbors, or groups? Slightly    During the past week, to what extent has your arm, shoulder or hand problem limited your work or other regular  daily activities Not at all    Arm, shoulder, or hand pain. Moderate    Tingling (pins and needles) in your arm, shoulder, or hand Mild    Difficulty Sleeping No difficulty    DASH Score 20.45 %                  OT Treatments/Exercises (OP) - 01/25/22 1113       Exercises   Exercises Wrist;Hand;Theraputty;Elbow      Elbow Exercises   Theraband Level (Supination) Level 2 (Red)   10X   Theraband Level (Pronation) Level 2 (Red)   10X                   OT Education - 01/25/22 1255     Education Details red resistance band - forearm supination/pronation    Person(s) Educated Patient    Methods Explanation;Demonstration;Verbal cues;Handout    Comprehension Returned demonstration;Verbalized understanding              OT Short Term Goals - 01/25/22 1126       OT SHORT TERM GOAL #1   Title Patient will be educated and independent with HEP in order to faciliate progress in therapy and allow her to return to using her right UE as her non dominant extremity for 75% or more of daily tasks.    Time 3    Period Weeks    Status Achieved    Target Date 01/26/22      OT SHORT TERM GOAL #2   Title Patient will increase right wrist and forearm P/ROM to [facility] in order to increase her ability to maage dishes while cooking or washing.    Time 3    Period Weeks    Status Achieved      OT SHORT TERM GOAL #3   Title Patient will report a decrease in right wrist pain of approximately 5/10 or less when completing daily tasks.    Time 3    Period Weeks    Status Achieved               OT Long Term Goals - 01/25/22 1126       OT LONG TERM GOAL #1   Title Patient will increase right wrist and hand A/ROM to [facility] in order to be able to stabilize containers and jars when attempting to open them.    Time 6    Period Weeks    Status Partially Met    Target Date 02/16/22  OT LONG TERM GOAL #2   Title Patient will increase her grip strength to 45# in order to  return to holding onto items such as grocery bags without difficulty.    Baseline 3/7: grip is 40#    Time 6    Period Weeks    Status Partially Met      OT LONG TERM GOAL #3   Title Pt will increase her right wrist strength to 4/5 or better in order to return to holding and manipulating items of moderate weight with less difficulty.    Time 6    Period Weeks    Status Achieved      OT LONG TERM GOAL #4   Title Pt will decrease her right wrist/forearm fascial restrictions to min amount or less in order to increase the functional mobility needed to complete basic household tasks such as bathing and dressing.    Time 6    Period Weeks    Status Achieved      OT LONG TERM GOAL #5   Title Patient will decrease fascial restrictions to min amount or less in order to increase the functional mobility needed to complete functional tasks such as grasping a doorknob to open.    Time 6    Period Weeks    Status Achieved                   Plan - 01/25/22 1257     Clinical Impression Statement A: Reassessment completed this date. Patient has met all short term goals and 3/5 LTGs with 2 partially met. Patient has improved her A/ROM for all wrist and forearm movements with the exception of wrist flexion which has stayed the same since evaluation. Hand strength and wrist strength are either functional or within normal ranges. Pt reports that she is using her hand for all daily tasks as needed. HEP was reviewed and updated. All education is completed.    Body Structure / Function / Physical Skills ADL;Strength;Pain;Edema;UE functional use;ROM;IADL;Fascial restriction;Mobility    Plan P: D/C from OT services with HEP.    Consulted and Agree with Plan of Care Patient             Patient will benefit from skilled therapeutic intervention in order to improve the following deficits and impairments:   Body Structure / Function / Physical Skills: ADL, Strength, Pain, Edema, UE functional use,  ROM, IADL, Fascial restriction, Mobility       Visit Diagnosis: Pain in right wrist  Stiffness of right wrist, not elsewhere classified  Other symptoms and signs involving the musculoskeletal system    Problem List Patient Active Problem List   Diagnosis Date Noted   Osteoporosis 01/06/2020   Fracture of left wrist with routine healing 10/04/2019   Fracture, Col[place], left, closed 09/09/2019   S/P ORIF (open reduction internal fixation) fracture 09/05/2019 left wrist  09/09/2019   Left wrist fracture    Hypertension 09/02/2019    Ailene Ravel, OTR/L,CBIS  [phone]  01/25/2022, 4:35 PM  [facility] [place], [place], [address] Phone: [phone]   Fax:  [phone]  Name: Lokelani ANNISTON NELLUMS MRN: [id number] Date of Birth: 1951/10/01

## 2022-01-27 ENCOUNTER — Ambulatory Visit (HOSPITAL_COMMUNITY): Payer: Medicare Other

## 2022-02-01 ENCOUNTER — Encounter (HOSPITAL_COMMUNITY): Payer: Medicare Other

## 2022-02-03 ENCOUNTER — Encounter (HOSPITAL_COMMUNITY): Payer: Medicare Other

## 2022-02-08 ENCOUNTER — Encounter (HOSPITAL_COMMUNITY): Payer: Medicare Other

## 2022-02-10 ENCOUNTER — Encounter (HOSPITAL_COMMUNITY): Payer: Medicare Other

## 2022-04-01 ENCOUNTER — Other Ambulatory Visit: Payer: Self-pay | Admitting: Family Medicine

## 2022-04-01 DIAGNOSIS — Z1231 Encounter for screening mammogram for malignant neoplasm of breast: Secondary | ICD-10-CM

## 2022-04-28 ENCOUNTER — Ambulatory Visit (HOSPITAL_COMMUNITY): Payer: Medicare Other | Admitting: Physical Therapy

## 2022-04-29 ENCOUNTER — Ambulatory Visit (HOSPITAL_COMMUNITY): Payer: Medicare Other | Attending: Orthopedic Surgery | Admitting: Physical Therapy

## 2022-04-29 DIAGNOSIS — M25511 Pain in right shoulder: Secondary | ICD-10-CM | POA: Insufficient documentation

## 2022-04-29 DIAGNOSIS — M25611 Stiffness of right shoulder, not elsewhere classified: Secondary | ICD-10-CM | POA: Insufficient documentation

## 2022-04-29 NOTE — Therapy (Signed)
OUTPATIENT PHYSICAL THERAPY SHOULDER EVALUATION   Patient Name: Victoria Dominguez MRN: [id number] DOB:November 30, 1950, 71 y.o., female Today's Date: 04/29/2022   PT End of Session - 04/29/22 1607     Visit Number 1    Number of Visits 12    Date for PT Re-Evaluation 06/10/22    Authorization Type medicare/mutual of [place]    Progress Note Due on Visit 10    PT Start Time 1530    PT Stop Time 1608    PT Time Calculation (min) 38 min    Activity Tolerance Patient limited by pain    Behavior During Therapy [facility] for tasks assessed/performed             Past Medical History:  Diagnosis Date   Hypertension    Past Surgical History:  Procedure Laterality Date   ABDOMINAL HYSTERECTOMY     CATARACT EXTRACTION W/PHACO Right 03/02/2021   Procedure: CATARACT EXTRACTION PHACO AND INTRAOCULAR LENS PLACEMENT (IOC) RIGHT 4.23 00:31.5;  Surgeon: Birder Robson, MD;  Location: [place];  Service: Ophthalmology;  Laterality: Right;  COVID + 01-10-21   CATARACT EXTRACTION W/PHACO Left 03/16/2021   Procedure: CATARACT EXTRACTION PHACO AND INTRAOCULAR LENS PLACEMENT (IOC) LEFT 7.06 00:40.7;  Surgeon: Birder Robson, MD;  Location: [place];  Service: Ophthalmology;  Laterality: Left;   ORIF WRIST FRACTURE Left 09/05/2019   Procedure: OPEN REDUCTION INTERNAL FIXATION (ORIF) WRIST FRACTURE;  Surgeon: Carole Civil, MD;  Location: [facility];  Service: Orthopedics;  Laterality: Left;   Renette Butters, MD    PCP: [place] Pink   REFERRING PROVIDER: Renette Butters, MD  REFERRING DIAG: PT Right shoulder pain - Post OP stiffness from wrist ORIF per Dr. Edmonia Lynch  THERAPY DIAG:  Right shoulder stiffness Right shoulder pain   Rationale for Evaluation and Treatment Rehabilitation  ONSET DATE: 01/19/2022  SUBJECTIVE:                                                                                                                                                                                       SUBJECTIVE STATEMENT: Pt states that she fx her wrist in December of 2022.Marland Kitchen She had OT and worked on her elbow and wrist but not her shoulder.   She is having Rt shoulder pain that radiates to her hand at rest and has increased pain with certain motions. She received a cortisone shot about 3 weeks ago which has helped 80%.   PERTINENT HISTORY:   Rt wrist   PAIN:  Are you having pain? Yes from the shoulder to the hand.  Pain is constant and feels like shock.  Pain at this  time:  5/10; With certain motions pain Will go to an 8/10   PRECAUTIONS: None  WEIGHT BEARING RESTRICTIONS No  FALLS:  Has patient fallen in last 6 months? Yes. Number of falls 1- pt slipped on ice and fx her wrist.    LIVING ENVIRONMENT: Lives with: lives with her husband Lives in: House/apartment OCCUPATION: Retired   PLOF: Independent with basic ADLs  PATIENT GOALS less pain to be able to use her shoulder better   OBJECTIVE:   DIAGNOSTIC FINDINGS:  N/A  PATIENT SURVEYS:     FOTO 63  COGNITION:  Overall cognitive status: Within functional limits for tasks assessed     SENSATION: Not tested  POSTURE: WFL  UPPER EXTREMITY ROM:   Active ROM Right eval Left eval  Shoulder flexion 115   Shoulder extension    Shoulder abduction 77   Shoulder adduction    Shoulder internal rotation 67   Shoulder external rotation 57                                   (Blank rows = not tested)  UPPER EXTREMITY MMT:  MMT Right eval Left eval  Shoulder flexion 4/5   Shoulder extension 5/5   Shoulder abduction 5/5 with pain   Shoulder adduction    Shoulder internal rotation 4/5   Shoulder external rotation 4-/5   Middle trapezius    Lower trapezius    Elbow flexion    Elbow extension    Wrist flexion    Wrist extension    Wrist ulnar deviation    Wrist radial deviation    Wrist pronation    Wrist supination    Grip strength (lbs)    (Blank rows = not  tested)    TODAY'S TREATMENT:  6/9:  Evaluation: Supine wand exercises for flexion and abduction                            Supine IR/ER   PATIENT EDUCATION: Education details: HEP Person educated: Patient Education method: Consulting civil engineer, Verbal cues, and Handouts Education comprehension: verbalized understanding and returned demonstration   HOME EXERCISE PROGRAM: [id number]- wand exercises/supine ER  ASSESSMENT:  CLINICAL IMPRESSION: Patient is a 71 y.o. f who was seen today for physical therapy evaluation and treatment for Right shoulder pain.  Evaluation demonstrates decrease ROM, decreased strength, increased pain, decreased ability to use Rt UE and mm tightness.  [name] will benefit from skilled PT to address these issues and return her to full functional ability in her RT arm.    OBJECTIVE IMPAIRMENTS decreased activity tolerance, decreased ROM, decreased strength, increased fascial restrictions, impaired UE functional use, and pain.   ACTIVITY LIMITATIONS carrying, lifting, dressing, reach over head, and hygiene/grooming  PARTICIPATION LIMITATIONS: cleaning, laundry, and shopping  PERSONAL FACTORS Past/current experiences and Time since onset of injury/illness/exacerbation are also affecting patient's functional outcome.   REHAB POTENTIAL: Good  CLINICAL DECISION MAKING: Evolving/moderate complexity  EVALUATION COMPLEXITY: Moderate   GOALS: Goals reviewed with patient? No  SHORT TERM GOALS: Target date: 05/20/2022   PT to be I in her HEP to decrease her pain to no greater than a 5/10 Baseline: Goal status: INITIAL  2.  Pt shoulder ROM to improve 15 degrees for improved functional use.  Baseline:  Goal status: INITIAL    LONG TERM GOALS: Target date: 06/10/2022   Pt to be completing  an advanced HEP to allow pain in Rt shoulder to decrease to no greater than a 2 Baseline:  Goal status: INITIAL  2.  Pt Shoulder flexion and abduction to be at 170 for improved  functional ability Baseline:  Goal status: INITIAL  3.  PT strength of Rt shoulder to be at least 4+ to allow pt to be able to put plates into cabinet, laundry, and carrying groceries without increased rt shoulder pain.  Baseline:  Goal status: INITIAL     PLAN: PT FREQUENCY: 2x/week  PT DURATION: 6 weeks  PLANNED INTERVENTIONS: Therapeutic exercises, Therapeutic activity, Patient/Family education, Joint mobilization, and Manual therapy  PLAN FOR NEXT SESSION: Manual if needed for pain, begin pulley and table exercises to improve ROM as well as PROM, jt mobs as needed  Rayetta Humphrey, PT CLT [phone]  04/29/2022, 4:15 PM

## 2022-05-03 ENCOUNTER — Ambulatory Visit (HOSPITAL_COMMUNITY): Payer: Medicare Other | Attending: Family Medicine | Admitting: Physical Therapy

## 2022-05-03 DIAGNOSIS — M25511 Pain in right shoulder: Secondary | ICD-10-CM | POA: Insufficient documentation

## 2022-05-03 DIAGNOSIS — M25611 Stiffness of right shoulder, not elsewhere classified: Secondary | ICD-10-CM | POA: Diagnosis not present

## 2022-05-03 NOTE — Therapy (Signed)
OUTPATIENT PHYSICAL THERAPY TREATMENT  Patient Name: Victoria Dominguez MRN: [id number] DOB:07/27/1951, 71 y.o., female Today's Date: 05/03/2022   PT End of Session - 05/03/22 1448     Visit Number 2    Number of Visits 12    Date for PT Re-Evaluation 06/10/22    Authorization Type medicare/mutual of [place]    Progress Note Due on Visit 10    PT Start Time 1448    PT Stop Time 1528    PT Time Calculation (min) 40 min    Activity Tolerance Patient limited by pain    Behavior During Therapy [facility] for tasks assessed/performed             Past Medical History:  Diagnosis Date   Hypertension    Past Surgical History:  Procedure Laterality Date   ABDOMINAL HYSTERECTOMY     CATARACT EXTRACTION W/PHACO Right 03/02/2021   Procedure: CATARACT EXTRACTION PHACO AND INTRAOCULAR LENS PLACEMENT (IOC) RIGHT 4.23 00:31.5;  Surgeon: Birder Robson, MD;  Location: [place];  Service: Ophthalmology;  Laterality: Right;  COVID + 01-10-21   CATARACT EXTRACTION W/PHACO Left 03/16/2021   Procedure: CATARACT EXTRACTION PHACO AND INTRAOCULAR LENS PLACEMENT (IOC) LEFT 7.06 00:40.7;  Surgeon: Birder Robson, MD;  Location: [place];  Service: Ophthalmology;  Laterality: Left;   ORIF WRIST FRACTURE Left 09/05/2019   Procedure: OPEN REDUCTION INTERNAL FIXATION (ORIF) WRIST FRACTURE;  Surgeon: Carole Civil, MD;  Location: [facility];  Service: Orthopedics;  Laterality: Left;   Renette Butters, MD    PCP: [place] Pink   REFERRING PROVIDER: Renette Butters, MD  REFERRING DIAG: PT Right shoulder pain - Post OP stiffness from wrist ORIF per Dr. Edmonia Lynch  THERAPY DIAG:  Right shoulder stiffness Right shoulder pain   Rationale for Evaluation and Treatment Rehabilitation  ONSET DATE: 01/19/2022  SUBJECTIVE:                                                                                                                                                                                       SUBJECTIVE STATEMENT: Pt states her shoulder is painful at 5/10, unchanged from last session.  Reports she is doing her HEP.   PERTINENT HISTORY:   Rt wrist   PAIN:  Are you having pain? Yes from the shoulder to the hand.  Pain is constant and feels like shock.  Pain at this time:  5/10; With certain motions pain Will go to an 8/10   PRECAUTIONS: None  WEIGHT BEARING RESTRICTIONS No  FALLS:  Has patient fallen in last 6 months? Yes. Number of falls 1- pt slipped on ice and fx her  wrist.    LIVING ENVIRONMENT: Lives with: lives with her husband Lives in: House/apartment OCCUPATION: Retired   PLOF: Independent with basic ADLs  PATIENT GOALS less pain to be able to use her shoulder better   OBJECTIVE:   DIAGNOSTIC FINDINGS:  N/A  PATIENT SURVEYS:     FOTO 77  COGNITION:  Overall cognitive status: Within functional limits for tasks assessed     SENSATION: Not tested  POSTURE: WFL  UPPER EXTREMITY ROM:   Active ROM Right eval Left eval  Shoulder flexion 115   Shoulder extension    Shoulder abduction 77   Shoulder adduction    Shoulder internal rotation 67   Shoulder external rotation 57   (Blank rows = not tested)  UPPER EXTREMITY MMT:  MMT Right eval Left eval  Shoulder flexion 4/5   Shoulder extension 5/5   Shoulder abduction 5/5 with pain   Shoulder adduction    Shoulder internal rotation 4/5   Shoulder external rotation 4-/5   (Blank rows = not tested)    TODAY'S TREATMENT:  05/03/22 Seated:  Pulleys flexion and abduction 10X each   Bil UE flexion 10X    Bil UE abduction 10X   Bil IR/ER at 90 degrees 10X Supine:  1# wand for flexion and abduction 10X each   IR/ER with 2# weight 10X   PROM for Rt shoulder motions Manual: above PROM in supine for Rt shoulder motions   Seated soft tissue massage for Rt upper trap, scalenes     6/9:  Evaluation: Supine wand exercises for flexion and abduction                             Supine IR/ER   PATIENT EDUCATION: Education details: HEP Person educated: Patient Education method: Consulting civil engineer, Verbal cues, and Handouts Education comprehension: verbalized understanding and returned demonstration   HOME EXERCISE PROGRAM: [id number]- wand exercises/supine ER  ASSESSMENT:  CLINICAL IMPRESSION: Reviewed goals, HEP and POC moving forward.  Some difficulty with IR/ER in supine and able to correct form.  Completed PROM of all RT shoulder motions with good ROM achieved, abduction being most limited due to pain.  Pt is completing UE strengthening using 3-5# weights at home as instructed by recent OT visits this year for her wrist.   Manual completed for Rt UE/shoulder musculature with tightness noted throughout.  Able to reduce and not resolve mm spasm in Rt UT and pt with reported reduction in pain following manual.  Encouraged to try moist heat to relax mm belly.  Pt will continue to  benefit from skilled PT to address these issues and return her to full functional ability in her RT arm.    OBJECTIVE IMPAIRMENTS decreased activity tolerance, decreased ROM, decreased strength, increased fascial restrictions, impaired UE functional use, and pain.   ACTIVITY LIMITATIONS carrying, lifting, dressing, reach over head, and hygiene/grooming  PARTICIPATION LIMITATIONS: cleaning, laundry, and shopping  PERSONAL FACTORS Past/current experiences and Time since onset of injury/illness/exacerbation are also affecting patient's functional outcome.   REHAB POTENTIAL: Good  CLINICAL DECISION MAKING: Evolving/moderate complexity  EVALUATION COMPLEXITY: Moderate   GOALS: Goals reviewed with patient? No  SHORT TERM GOALS: Target date: 05/20/2022   PT to be I in her HEP to decrease her pain to no greater than a 5/10 Baseline: Goal status: IN PROGRESS  2.  Pt shoulder ROM to improve 15 degrees for improved functional use.  Baseline:  Goal status: IN PROGRESS  LONG TERM GOALS:  Target date: 06/10/2022   Pt to be completing an advanced HEP to allow pain in Rt shoulder to decrease to no greater than a 2 Baseline:  Goal status: IN PROGRESS  2.  Pt Shoulder flexion and abduction to be at 170 for improved functional ability Baseline:  Goal status: IN PROGRESS  3.  PT strength of Rt shoulder to be at least 4+ to allow pt to be able to put plates into cabinet, laundry, and carrying groceries without increased rt shoulder pain.  Baseline:  Goal status: IN PROGRESS     PLAN: PT FREQUENCY: 2x/week  PT DURATION: 6 weeks  PLANNED INTERVENTIONS: Therapeutic exercises, Therapeutic activity, Patient/Family education, Joint mobilization, and Manual therapy  PLAN FOR NEXT SESSION:  Continue to progress ROM of Rt shoulder.  Manual and joint mobilizations as needed.   Teena Irani, PTA/CLT [place] Ph: [phone]  05/03/2022, 4:15 PM

## 2022-05-04 ENCOUNTER — Ambulatory Visit (HOSPITAL_COMMUNITY): Payer: Medicare Other | Attending: Family Medicine | Admitting: Physical Therapy

## 2022-05-04 ENCOUNTER — Encounter (HOSPITAL_COMMUNITY): Payer: Self-pay | Admitting: Physical Therapy

## 2022-05-04 DIAGNOSIS — M25511 Pain in right shoulder: Secondary | ICD-10-CM | POA: Diagnosis present

## 2022-05-04 DIAGNOSIS — M25611 Stiffness of right shoulder, not elsewhere classified: Secondary | ICD-10-CM

## 2022-05-04 NOTE — Therapy (Signed)
OUTPATIENT PHYSICAL THERAPY TREATMENT  Patient Name: Victoria Dominguez MRN: [id number] DOB:February 27, 1951, 71 y.o., female Today's Date: 05/04/2022   PT End of Session - 05/04/22 1442     Visit Number 3    Number of Visits 12    Date for PT Re-Evaluation 06/10/22    Authorization Type medicare/mutual of [place]    Progress Note Due on Visit 10    PT Start Time 1442    PT Stop Time 1520    PT Time Calculation (min) 38 min    Activity Tolerance Patient limited by pain    Behavior During Therapy [facility] for tasks assessed/performed             Past Medical History:  Diagnosis Date   Hypertension    Past Surgical History:  Procedure Laterality Date   ABDOMINAL HYSTERECTOMY     CATARACT EXTRACTION W/PHACO Right 03/02/2021   Procedure: CATARACT EXTRACTION PHACO AND INTRAOCULAR LENS PLACEMENT (IOC) RIGHT 4.23 00:31.5;  Surgeon: Birder Robson, MD;  Location: [place];  Service: Ophthalmology;  Laterality: Right;  COVID + 01-10-21   CATARACT EXTRACTION W/PHACO Left 03/16/2021   Procedure: CATARACT EXTRACTION PHACO AND INTRAOCULAR LENS PLACEMENT (IOC) LEFT 7.06 00:40.7;  Surgeon: Birder Robson, MD;  Location: [place];  Service: Ophthalmology;  Laterality: Left;   ORIF WRIST FRACTURE Left 09/05/2019   Procedure: OPEN REDUCTION INTERNAL FIXATION (ORIF) WRIST FRACTURE;  Surgeon: Carole Civil, MD;  Location: [facility];  Service: Orthopedics;  Laterality: Left;   Renette Butters, MD    PCP: [place] Pink   REFERRING PROVIDER: Renette Butters, MD  REFERRING DIAG: PT Right shoulder pain - Post OP stiffness from wrist ORIF per Dr. Edmonia Lynch  THERAPY DIAG:  Right shoulder stiffness Right shoulder pain   Rationale for Evaluation and Treatment Rehabilitation  ONSET DATE: 01/19/2022  SUBJECTIVE:                                                                                                                                                                                       SUBJECTIVE STATEMENT: States shoulder is sore. She has to wait for pain to go away to finish her chores at home.   PERTINENT HISTORY:   Rt wrist   PAIN:  Are you having pain? Yes from the shoulder to the hand.  Pain is constant and feels like shock.  Pain at this time:  5/10; With certain motions pain Will go to an 8/10   PRECAUTIONS: None  WEIGHT BEARING RESTRICTIONS No  FALLS:  Has patient fallen in last 6 months? Yes. Number of falls 1- pt slipped on ice and fx her  wrist.    LIVING ENVIRONMENT: Lives with: lives with her husband Lives in: House/apartment OCCUPATION: Retired   PLOF: Independent with basic ADLs  PATIENT GOALS less pain to be able to use her shoulder better   OBJECTIVE:   DIAGNOSTIC FINDINGS:  N/A  PATIENT SURVEYS:     FOTO 53  COGNITION:  Overall cognitive status: Within functional limits for tasks assessed     SENSATION: Not tested  POSTURE: WFL  UPPER EXTREMITY ROM:   Active ROM Right eval Left eval Right 6/14  Shoulder flexion 115  127 which improves to 160 following shoulder flexion stretch at wall  Shoulder extension     Shoulder abduction 77  97 which improves to approx 160 after stretch  Shoulder adduction     Shoulder internal rotation 67    Shoulder external rotation 57    (Blank rows = not tested)  UPPER EXTREMITY MMT:  MMT Right eval Left eval  Shoulder flexion 4/5   Shoulder extension 5/5   Shoulder abduction 5/5 with pain   Shoulder adduction    Shoulder internal rotation 4/5   Shoulder external rotation 4-/5   (Blank rows = not tested)    TODAY'S TREATMENT:  05/04/22 Large green ball up wall flexion stretch 10 x 5 second holds Standing shoulder flexion with band between hands RTB 2x 10  Scap retraction with GH ER RTB 2x 10  Horizontal abduction RTB 2x 10  Row GTB 2x10 Shoulder extension GTB 2x 10    05/03/22 Seated:  Pulleys flexion and abduction 10X each   Bil UE flexion 10X    Bil UE  abduction 10X   Bil IR/ER at 90 degrees 10X Supine:  1# wand for flexion and abduction 10X each   IR/ER with 2# weight 10X   PROM for Rt shoulder motions Manual: above PROM in supine for Rt shoulder motions   Seated soft tissue massage for Rt upper trap, scalenes     6/9:  Evaluation: Supine wand exercises for flexion and abduction                            Supine IR/ER   PATIENT EDUCATION: Education details: HEP Person educated: Patient Education method: Consulting civil engineer, Verbal cues, and Handouts Education comprehension: verbalized understanding and returned demonstration   HOME EXERCISE PROGRAM: [id number]- wand exercises/supine ER 6/14 - Shoulder Flexion Wall Slide with Towel  - 1 x daily - 7 x weekly - 10 reps - 5 second hold - Standing Shoulder Flexion Full Range  - 1 x daily - 7 x weekly - 2 sets - 10 reps - Shoulder External Rotation and Scapular Retraction with Resistance  - 1 x daily - 7 x weekly - 2 sets - 10 reps - Standing Shoulder Horizontal Abduction with Resistance  - 1 x daily - 7 x weekly - 2 sets - 10 reps  ASSESSMENT:  CLINICAL IMPRESSION: Patient with continued ROM restrictions at beginning of session. ROM improves to 160 following shoulder flexion ball stretch at wall with minimal to no symptoms. Began resisted shoulder and postural strengthening without c/o pain. Educated on probable soreness. Patient will continue to benefit from physical therapy in order to improve function and reduce impairment.    OBJECTIVE IMPAIRMENTS decreased activity tolerance, decreased ROM, decreased strength, increased fascial restrictions, impaired UE functional use, and pain.   ACTIVITY LIMITATIONS carrying, lifting, dressing, reach over head, and hygiene/grooming  PARTICIPATION LIMITATIONS: cleaning, laundry, and shopping  PERSONAL FACTORS Past/current experiences and Time since onset of injury/illness/exacerbation are also affecting patient's functional outcome.   REHAB  POTENTIAL: Good  CLINICAL DECISION MAKING: Evolving/moderate complexity  EVALUATION COMPLEXITY: Moderate   GOALS: Goals reviewed with patient? No  SHORT TERM GOALS: Target date: 05/20/2022   PT to be I in her HEP to decrease her pain to no greater than a 5/10 Baseline: Goal status: IN PROGRESS  2.  Pt shoulder ROM to improve 15 degrees for improved functional use.  Baseline:  Goal status: IN PROGRESS    LONG TERM GOALS: Target date: 06/10/2022   Pt to be completing an advanced HEP to allow pain in Rt shoulder to decrease to no greater than a 2 Baseline:  Goal status: IN PROGRESS  2.  Pt Shoulder flexion and abduction to be at 170 for improved functional ability Baseline:  Goal status: IN PROGRESS  3.  PT strength of Rt shoulder to be at least 4+ to allow pt to be able to put plates into cabinet, laundry, and carrying groceries without increased rt shoulder pain.  Baseline:  Goal status: IN PROGRESS     PLAN: PT FREQUENCY: 2x/week  PT DURATION: 6 weeks  PLANNED INTERVENTIONS: Therapeutic exercises, Therapeutic activity, Patient/Family education, Joint mobilization, and Manual therapy  PLAN FOR NEXT SESSION:    Shoulder and periscap/postural strength  2:43 PM, 05/04/22 Mearl Latin PT, DPT Physical Therapist at [facility]

## 2022-05-10 ENCOUNTER — Ambulatory Visit (HOSPITAL_COMMUNITY): Payer: Medicare Other | Attending: Family Medicine | Admitting: Physical Therapy

## 2022-05-10 DIAGNOSIS — M25611 Stiffness of right shoulder, not elsewhere classified: Secondary | ICD-10-CM | POA: Diagnosis not present

## 2022-05-10 DIAGNOSIS — Z4889 Encounter for other specified surgical aftercare: Secondary | ICD-10-CM | POA: Insufficient documentation

## 2022-05-10 DIAGNOSIS — M25511 Pain in right shoulder: Secondary | ICD-10-CM | POA: Diagnosis present

## 2022-05-10 NOTE — Therapy (Signed)
OUTPATIENT PHYSICAL THERAPY TREATMENT  Patient Name: Victoria Dominguez MRN: [id number] DOB:July 28, 1951, 71 y.o., female Today's Date: 05/10/2022   PT End of Session - 05/10/22 1041     Visit Number 4    Number of Visits 12    Date for PT Re-Evaluation 06/10/22    Authorization Type medicare/mutual of [place]    Progress Note Due on Visit 10    PT Start Time 1003    PT Stop Time 1041    PT Time Calculation (min) 38 min    Activity Tolerance Patient limited by pain    Behavior During Therapy [facility] for tasks assessed/performed              Past Medical History:  Diagnosis Date   Hypertension    Past Surgical History:  Procedure Laterality Date   ABDOMINAL HYSTERECTOMY     CATARACT EXTRACTION W/PHACO Right 03/02/2021   Procedure: CATARACT EXTRACTION PHACO AND INTRAOCULAR LENS PLACEMENT (IOC) RIGHT 4.23 00:31.5;  Surgeon: Birder Robson, MD;  Location: [place];  Service: Ophthalmology;  Laterality: Right;  COVID + 01-10-21   CATARACT EXTRACTION W/PHACO Left 03/16/2021   Procedure: CATARACT EXTRACTION PHACO AND INTRAOCULAR LENS PLACEMENT (IOC) LEFT 7.06 00:40.7;  Surgeon: Birder Robson, MD;  Location: [place];  Service: Ophthalmology;  Laterality: Left;   ORIF WRIST FRACTURE Left 09/05/2019   Procedure: OPEN REDUCTION INTERNAL FIXATION (ORIF) WRIST FRACTURE;  Surgeon: Carole Civil, MD;  Location: [facility];  Service: Orthopedics;  Laterality: Left;   Renette Butters, MD    PCP: [place] Pink   REFERRING PROVIDER: Renette Butters, MD  REFERRING DIAG: PT Right shoulder pain - Post OP stiffness from wrist ORIF per Dr. Edmonia Lynch  THERAPY DIAG:  Right shoulder stiffness Right shoulder pain   Rationale for Evaluation and Treatment Rehabilitation  ONSET DATE: 01/19/2022  SUBJECTIVE:                                                                                                                                                                                       SUBJECTIVE STATEMENT: States shoulder is sore, around a 3/10 today. Completing her exercises at home and really having no difficulty with her ADLs at this time, some difficulty with putting on her shirt, overhead.  PERTINENT HISTORY:   Rt wrist   PAIN:  Are you having pain? Yes from the shoulder to the hand.  Pain is constant and feels like shock.  Pain at this time:  3/10; With certain motions pain Will go to an 6/10   PRECAUTIONS: None  WEIGHT BEARING RESTRICTIONS No  FALLS:  Has patient fallen in last 6  months? Yes. Number of falls 1- pt slipped on ice and fx her wrist.    LIVING ENVIRONMENT: Lives with: lives with her husband Lives in: House/apartment OCCUPATION: Retired   PLOF: Independent with basic ADLs  PATIENT GOALS less pain to be able to use her shoulder better   OBJECTIVE:   DIAGNOSTIC FINDINGS:  N/A  PATIENT SURVEYS:     FOTO 30  COGNITION:  Overall cognitive status: Within functional limits for tasks assessed     SENSATION: Not tested  POSTURE: WFL  UPPER EXTREMITY ROM:   Active ROM Right eval Left eval Right  05/04/22 Right 05/11/22  Shoulder flexion 115  127 which improves to 160 following shoulder flexion stretch at wall   Shoulder extension      Shoulder abduction 77  97 which improves to approx 160 after stretch   Shoulder adduction      Shoulder internal rotation 67     Shoulder external rotation 57     (Blank rows = not tested)  UPPER EXTREMITY MMT:  MMT Right eval Left eval Right 05/11/22  Shoulder flexion 4/5    Shoulder extension 5/5    Shoulder abduction 5/5 with pain    Shoulder adduction     Shoulder internal rotation 4/5    Shoulder external rotation 4-/5    (Blank rows = not tested)    TODAY'S TREATMENT:  05/10/22 Standing: UE flexion wallslide facing wall for stretch 10X10"   UE flexion back against wall 10X   GTB postural 3 10X each Sitting: 1# cane flexion 10X   1# cane abduction 10X   3# DB  curls, lateral raises, ER 10X each Manual:  Soft tissue massage to Rt upper trap seated  05/04/22 Large green ball up wall flexion stretch 10 x 5 second holds Standing shoulder flexion with band between hands RTB 2x 10  Scap retraction with GH ER RTB 2x 10  Horizontal abduction RTB 2x 10  Row GTB 2x10 Shoulder extension GTB 2x 10    05/03/22 Seated:  Pulleys flexion and abduction 10X each   Bil UE flexion 10X    Bil UE abduction 10X   Bil IR/ER at 90 degrees 10X Supine:  1# wand for flexion and abduction 10X each   IR/ER with 2# weight 10X   PROM for Rt shoulder motions Manual: above PROM in supine for Rt shoulder motions   Seated soft tissue massage for Rt upper trap, scalenes     6/9:  Evaluation: Supine wand exercises for flexion and abduction                            Supine IR/ER   PATIENT EDUCATION: Education details: HEP Person educated: Patient Education method: Consulting civil engineer, Verbal cues, and Handouts Education comprehension: verbalized understanding and returned demonstration   HOME EXERCISE PROGRAM: [id number]- wand exercises/supine ER 6/14 - Shoulder Flexion Wall Slide with Towel  - 1 x daily - 7 x weekly - 10 reps - 5 second hold - Standing Shoulder Flexion Full Range  - 1 x daily - 7 x weekly - 2 sets - 10 reps - Shoulder External Rotation and Scapular Retraction with Resistance  - 1 x daily - 7 x weekly - 2 sets - 10 reps - Standing Shoulder Horizontal Abduction with Resistance  - 1 x daily - 7 x weekly - 2 sets - 10 reps  ASSESSMENT:  CLINICAL IMPRESSION: Patient overall greatly improved with minimal restrictions at  this point.  Completing therex at home and only functional difficulty is now with donning shirt overhead.  Pt does continue to have a large mm spasm in Rt UT; suggested doing self manual for this as well as stretches.  Continued with resisted shoulder and postural strengthening without c/o pain. Pt requests next visit to possibly be her last;  informed we would re test measures and see if goals have been met.    OBJECTIVE IMPAIRMENTS decreased activity tolerance, decreased ROM, decreased strength, increased fascial restrictions, impaired UE functional use, and pain.   ACTIVITY LIMITATIONS carrying, lifting, dressing, reach over head, and hygiene/grooming  PARTICIPATION LIMITATIONS: cleaning, laundry, and shopping  PERSONAL FACTORS Past/current experiences and Time since onset of injury/illness/exacerbation are also affecting patient's functional outcome.   REHAB POTENTIAL: Good  CLINICAL DECISION MAKING: Evolving/moderate complexity  EVALUATION COMPLEXITY: Moderate   GOALS: Goals reviewed with patient? No  SHORT TERM GOALS: Target date: 05/20/2022   PT to be I in her HEP to decrease her pain to no greater than a 5/10 Baseline: Goal status: IN PROGRESS  2.  Pt shoulder ROM to improve 15 degrees for improved functional use.  Baseline:  Goal status: IN PROGRESS    LONG TERM GOALS: Target date: 06/10/2022   Pt to be completing an advanced HEP to allow pain in Rt shoulder to decrease to no greater than a 2 Baseline:  Goal status: IN PROGRESS  2.  Pt Shoulder flexion and abduction to be at 170 for improved functional ability Baseline:  Goal status: IN PROGRESS  3.  PT strength of Rt shoulder to be at least 4+ to allow pt to be able to put plates into cabinet, laundry, and carrying groceries without increased rt shoulder pain.  Baseline:  Goal status: IN PROGRESS     PLAN: PT FREQUENCY: 2x/week  PT DURATION: 6 weeks  PLANNED INTERVENTIONS: Therapeutic exercises, Therapeutic activity, Patient/Family education, Joint mobilization, and Manual therapy  PLAN FOR NEXT SESSION:    Recheck ROM, goals.  Instruct with Upper trap stretch for Rt UT.  10:42 AM, 05/10/22 Teena Irani, PTA/CLT [place] Ph: [phone]

## 2022-05-11 ENCOUNTER — Encounter (HOSPITAL_COMMUNITY): Payer: Self-pay | Admitting: Physical Therapy

## 2022-05-11 ENCOUNTER — Ambulatory Visit (HOSPITAL_COMMUNITY): Payer: Medicare Other | Admitting: Physical Therapy

## 2022-05-11 DIAGNOSIS — M25511 Pain in right shoulder: Secondary | ICD-10-CM

## 2022-05-11 DIAGNOSIS — M25611 Stiffness of right shoulder, not elsewhere classified: Secondary | ICD-10-CM

## 2022-05-11 NOTE — Therapy (Addendum)
OUTPATIENT PHYSICAL THERAPY TREATMENT  Patient Name: Victoria Dominguez MRN: [id number] DOB:[birth year], 71 y.o., female Today's Date: 05/11/2022   PHYSICAL THERAPY DISCHARGE SUMMARY  Visits from Start of Care: 5  Current functional level related to goals / functional outcomes: See below   Remaining deficits: See below   Education / Equipment: HEP   Patient agrees to discharge. Patient goals were met. Patient is being discharged due to being pleased with the current functional level.   PT End of Session - 05/11/22 1141     Visit Number 5    Number of Visits 5    Date for PT Re-Evaluation 06/10/22    Authorization Type medicare/mutual of [place]    Progress Note Due on Visit 10    PT Start Time 1135    PT Stop Time 1150    PT Time Calculation (min) 15 min    Activity Tolerance Patient limited by pain    Behavior During Therapy [facility] for tasks assessed/performed               Past Medical History:  Diagnosis Date   Hypertension    Past Surgical History:  Procedure Laterality Date   ABDOMINAL HYSTERECTOMY     CATARACT EXTRACTION W/PHACO Right 03/02/2021   Procedure: CATARACT EXTRACTION PHACO AND INTRAOCULAR LENS PLACEMENT (IOC) RIGHT 4.23 00:31.5;  Surgeon: Birder Robson, MD;  Location: [place];  Service: Ophthalmology;  Laterality: Right;  COVID + 01-10-21   CATARACT EXTRACTION W/PHACO Left 03/16/2021   Procedure: CATARACT EXTRACTION PHACO AND INTRAOCULAR LENS PLACEMENT (IOC) LEFT 7.06 00:40.7;  Surgeon: Birder Robson, MD;  Location: [place];  Service: Ophthalmology;  Laterality: Left;   ORIF WRIST FRACTURE Left 09/05/2019   Procedure: OPEN REDUCTION INTERNAL FIXATION (ORIF) WRIST FRACTURE;  Surgeon: Carole Civil, MD;  Location: [facility];  Service: Orthopedics;  Laterality: Left;   Renette Butters, MD    PCP: [place] Pink   REFERRING PROVIDER: Renette Butters, MD  REFERRING DIAG: PT Right shoulder pain - Post OP stiffness from  wrist ORIF per Dr. Edmonia Lynch  THERAPY DIAG:  Right shoulder stiffness Right shoulder pain   Rationale for Evaluation and Treatment Rehabilitation  ONSET DATE: 01/19/2022  SUBJECTIVE:                                                                                                                                                                                      SUBJECTIVE STATEMENT: States Rt shoulder is sore from the massage yesterday but no other pain reported at this time. Pt reports compliance with HEP and no difficulty with basic ADLs.  Pt feels she can be discharged  at this point.   PERTINENT HISTORY:   Rt wrist   PAIN:  Are you having pain? Yes from the shoulder to the hand.  Pain is constant and feels like shock.  Pain at this time:  3/10; With certain motions pain Will go to an 6/10   PRECAUTIONS: None  WEIGHT BEARING RESTRICTIONS No  FALLS:  Has patient fallen in last 6 months? Yes. Number of falls 1- pt slipped on ice and fx her wrist.    LIVING ENVIRONMENT: Lives with: lives with her husband Lives in: House/apartment OCCUPATION: Retired   PLOF: Independent with basic ADLs  PATIENT GOALS less pain to be able to use her shoulder better   OBJECTIVE:   DIAGNOSTIC FINDINGS:  N/A  PATIENT SURVEYS:     FOTO 71% (was 45% at evaluation)  COGNITION:  Overall cognitive status: Within functional limits for tasks assessed     SENSATION: Not tested  POSTURE: WFL  UPPER EXTREMITY ROM:   Active ROM Right eval Left eval Right  05/04/22 Right 05/11/22  Shoulder flexion 115  127 which improves to 160 following shoulder flexion stretch at wall AROM 170  Shoulder extension      Shoulder abduction 77  97 which improves to approx 160 after stretch AROM 140 AAROM 160  Shoulder adduction      Shoulder internal rotation 67   70  Shoulder external rotation 57   70  (Blank rows = not tested)  UPPER EXTREMITY MMT:  MMT Right eval Left eval Right 05/11/22   Shoulder flexion 4/5  5/5  Shoulder extension 5/5  5/5  Shoulder abduction 5/5 with pain  5/5  Shoulder adduction     Shoulder internal rotation 4/5  5/5  Shoulder external rotation 4-/5  5/5  (Blank rows = not tested)    TODAY'S TREATMENT:  05/10/22 Standing: UE flexion wallslide facing wall for stretch 10X10"   UE flexion back against wall 10X   GTB postural 3 10X each Sitting: 1# cane flexion 10X   1# cane abduction 10X   3# DB curls, lateral raises, ER 10X each Manual:  Soft tissue massage to Rt upper trap seated  05/04/22 Large green ball up wall flexion stretch 10 x 5 second holds Standing shoulder flexion with band between hands RTB 2x 10  Scap retraction with GH ER RTB 2x 10  Horizontal abduction RTB 2x 10  Row GTB 2x10 Shoulder extension GTB 2x 10    05/03/22 Seated:  Pulleys flexion and abduction 10X each   Bil UE flexion 10X    Bil UE abduction 10X   Bil IR/ER at 90 degrees 10X Supine:  1# wand for flexion and abduction 10X each   IR/ER with 2# weight 10X   PROM for Rt shoulder motions Manual: above PROM in supine for Rt shoulder motions   Seated soft tissue massage for Rt upper trap, scalenes     6/9:  Evaluation: Supine wand exercises for flexion and abduction                            Supine IR/ER   PATIENT EDUCATION: Education details: HEP Person educated: Patient Education method: Consulting civil engineer, Verbal cues, and Handouts Education comprehension: verbalized understanding and returned demonstration   HOME EXERCISE PROGRAM: [id number]- wand exercises/supine ER 6/14 - Shoulder Flexion Wall Slide with Towel  - 1 x daily - 7 x weekly - 10 reps - 5 second hold - Standing Shoulder Flexion  Full Range  - 1 x daily - 7 x weekly - 2 sets - 10 reps - Shoulder External Rotation and Scapular Retraction with Resistance  - 1 x daily - 7 x weekly - 2 sets - 10 reps - Standing Shoulder Horizontal Abduction with Resistance  - 1 x daily - 7 x weekly - 2 sets - 10  reps  ASSESSMENT:  CLINICAL IMPRESSION: Pt reassessed today as she reports she is ready for discharge.  Test measures reveal vast improvement in both ROM and strength.  FOTO has increased 26 points with 71% functional status.  Pt has met all goals with exception of AROM of 170 degrees for both flexion and abduction.  Pt was able to obtain this for flexion, however abduction is still limited at 140 degrees.  Pt intends on working on this remaining deficit at home and denies further need for skilled therapy.  Pt is now ready for discharge.    OBJECTIVE IMPAIRMENTS decreased activity tolerance, decreased ROM, decreased strength, increased fascial restrictions, impaired UE functional use, and pain.   ACTIVITY LIMITATIONS carrying, lifting, dressing, reach over head, and hygiene/grooming  PARTICIPATION LIMITATIONS: cleaning, laundry, and shopping  PERSONAL FACTORS Past/current experiences and Time since onset of injury/illness/exacerbation are also affecting patient's functional outcome.   REHAB POTENTIAL: Good  CLINICAL DECISION MAKING: Evolving/moderate complexity  EVALUATION COMPLEXITY: Moderate   GOALS: Goals reviewed with patient? No  SHORT TERM GOALS: Target date: 05/20/2022   PT to be I in her HEP to decrease her pain to no greater than a 5/10 Baseline: Goal status: MET  2.  Pt shoulder ROM to improve 15 degrees for improved functional use.  Baseline:  Goal status: MET    LONG TERM GOALS: Target date: 06/10/2022   Pt to be completing an advanced HEP to allow pain in Rt shoulder to decrease to no greater than a 2 Baseline:  Goal status: MET  2.  Pt Shoulder flexion and abduction to be at 170 for improved functional ability Baseline:  Goal status: NOT MET  met for flexion but not abduction  3.  PT strength of Rt shoulder to be at least 4+ to allow pt to be able to put plates into cabinet, laundry, and carrying groceries without increased rt shoulder pain.  Baseline:   Goal status: MET     PLAN: PT FREQUENCY: 2x/week  PT DURATION: 6 weeks  PLANNED INTERVENTIONS: Therapeutic exercises, Therapeutic activity, Patient/Family education, Joint mobilization, and Manual therapy  PLAN FOR NEXT SESSION:    discharge.  11:44 AM, 05/11/22 Teena Irani, PTA/CLT Rayetta Humphrey, PT CLT [place] Ph: [phone]

## 2022-05-23 IMAGING — MR MR HEAD WO/W CM
15 series · 48 of 48 positions shown · IV contrast (6ml Gadavist)
Comparison: None.

CLINICAL DATA: Dizziness intractable headache unspecified
chronicity pattern. History meningioma.

EXAM:
MRI HEAD WITHOUT AND WITH CONTRAST
TECHNIQUE: Multiplanar, multiecho pulse sequences of the brain and surrounding
structures were obtained without and with intravenous contrast.
CONTRAST:  6mL GADAVIST GADOBUTROL 1 MMOL/ML IV SOLN

[Series 5: ax dwi_tracew · axial · 3.0mm · 0.60mm/px · z∈[-82,+72]mm · 3 of 48 slices shown]
[im 1/48]
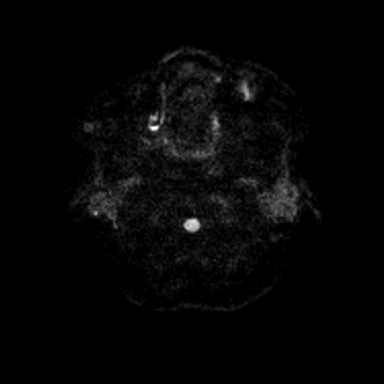
[im 24/48]
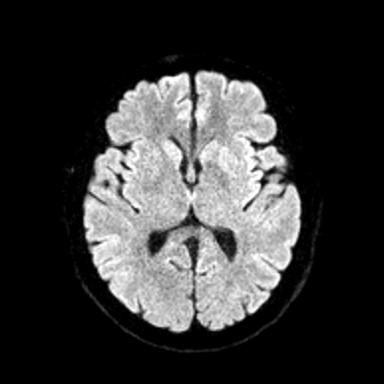
[im 48/48]
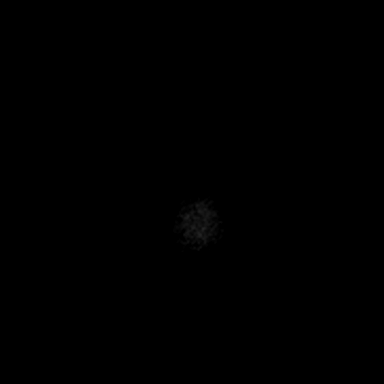

[Series 6: ax dwi_adc · axial · 3.0mm · 0.60mm/px · z∈[-82,+72]mm · 3 of 48 slices shown]
[im 1/48]
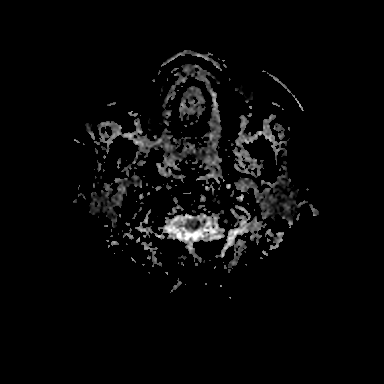
[im 24/48]
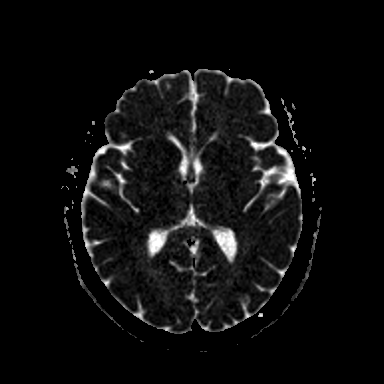
[im 48/48]
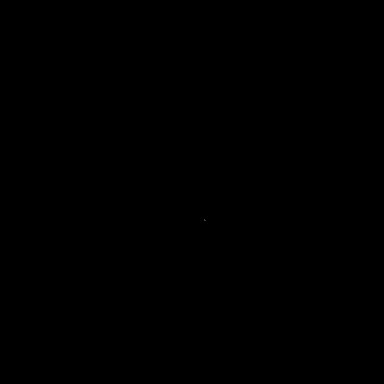

[Series 7: cor dwi_tracew · coronal · 5.0mm · 0.60mm/px · 2 of 36 slices shown]
[im 1/36]
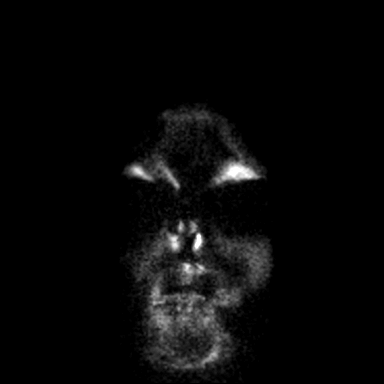
[im 36/36]
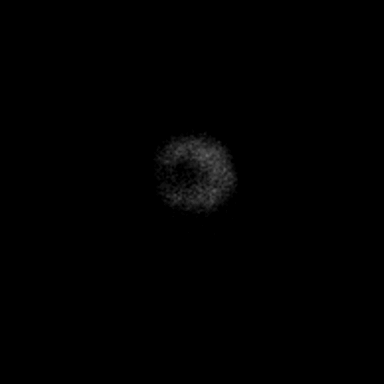

[Series 8: cor dwi_adc · coronal · 5.0mm · 0.60mm/px · 2 of 34 slices shown]
[im 1/34]
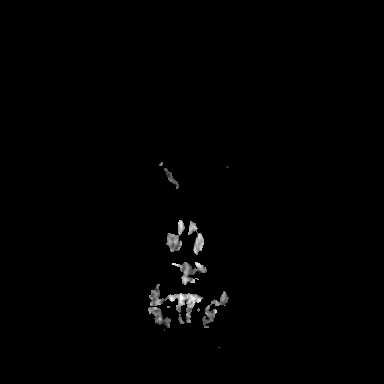
[im 34/34]
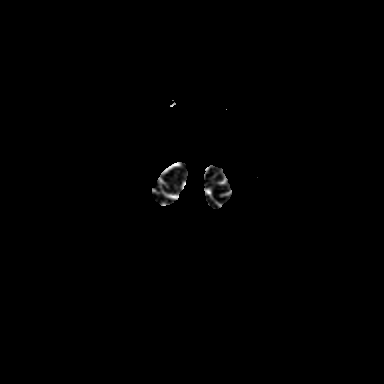

[Series 9: T1 · sagittal · 5.0mm · 0.62mm/px · 1 of 23 slices shown (1 of 2)]
[im 1/23]
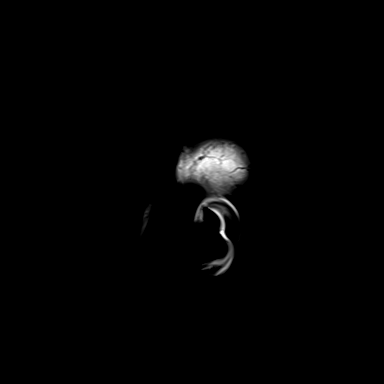

[Series 10: T2 · axial · 5.0mm · 0.53mm/px · 1 of 27 slices shown]
[im 1/27]
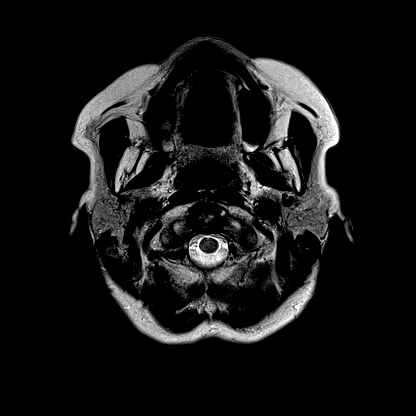

[Series 12: pha_images · axial · 3.0mm · 0.90mm/px · z∈[-93,+77]mm · 3 of 57 slices shown]
[im 1/57]
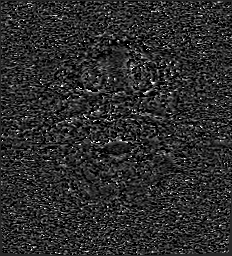
[im 29/57]
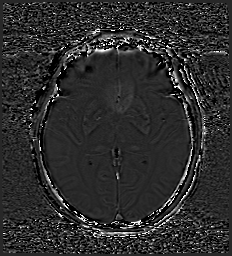
[im 57/57]
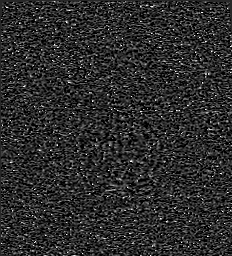

[Series 13: swi_images · axial · 3.0mm · 0.90mm/px · z∈[-93,+83]mm · 3 of 60 slices shown]
[im 1/60]
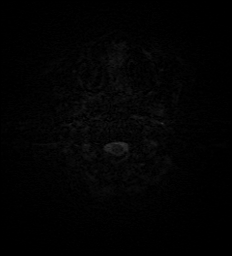
[im 30/60]
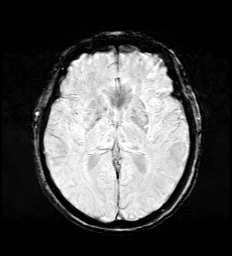
[im 60/60]
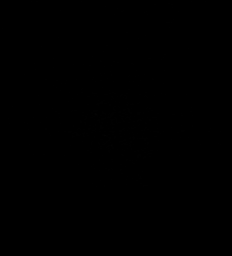

[Series 15: FLAIR · axial · 3.0mm · 0.53mm/px · z∈[-85,+76]mm · 3 of 55 slices shown]
[im 1/55]
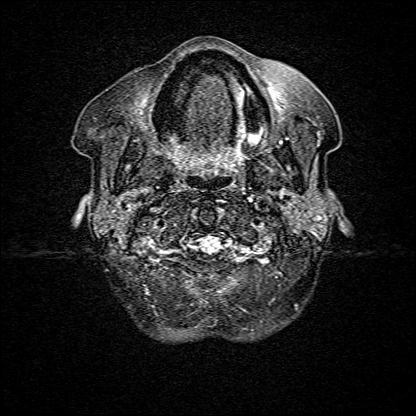
[im 28/55]
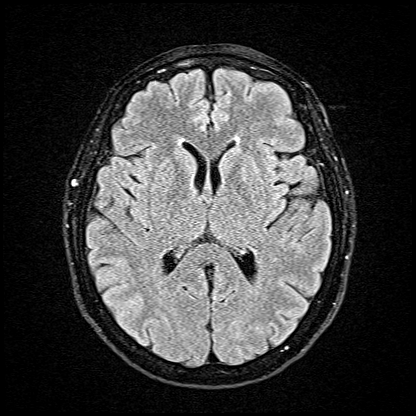
[im 55/55]
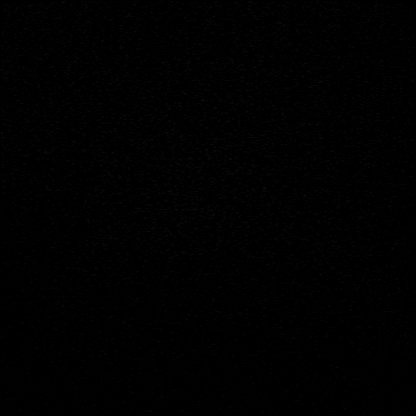

[Series 16: T1 · axial · 1.0mm · 0.98mm/px · z∈[-93,+81]mm · 8 of 175 slices shown (2 of 2)]
[im 1/175]
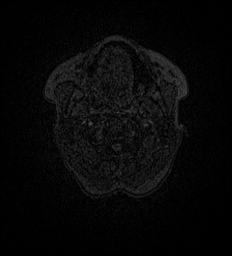
[im 25/175]
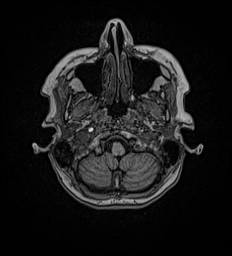
[im 50/175]
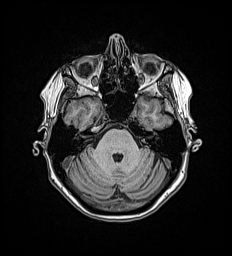
[im 75/175]
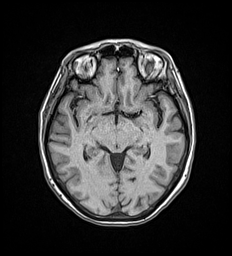
[im 100/175]
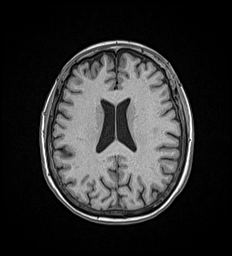
[im 125/175]
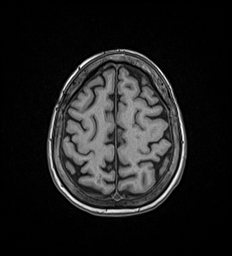
[im 150/175]
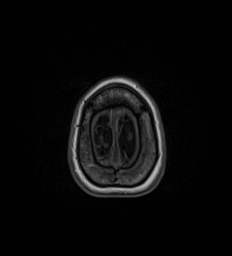
[im 175/175]
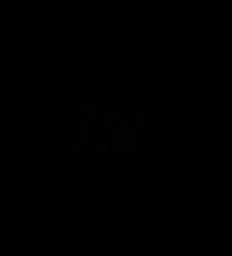

[Series 17: T2 post-contrast · coronal · 5.0mm · 0.57mm/px · 1 of 28 slices shown]
[im 1/28]
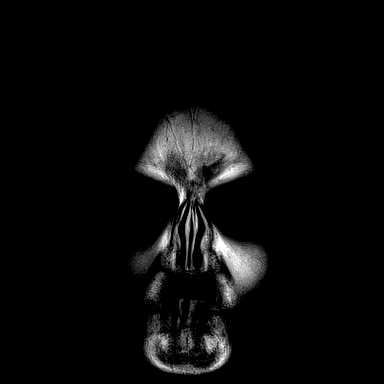

[Series 18: T1 post-contrast · axial · 1.0mm · 0.98mm/px · z∈[-93,+79]mm · 8 of 174 slices shown (1 of 4)]
[im 1/174]
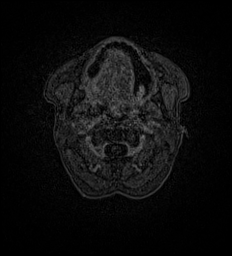
[im 25/174]
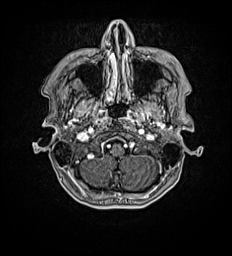
[im 50/174]
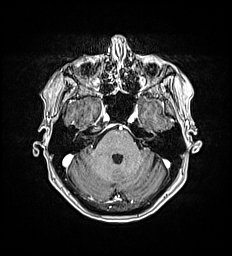
[im 75/174]
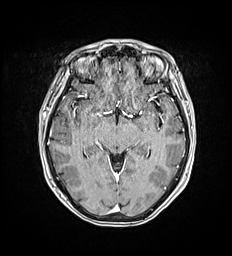
[im 99/174]
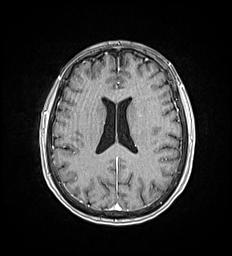
[im 124/174]
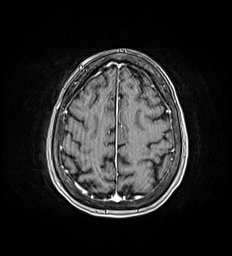
[im 149/174]
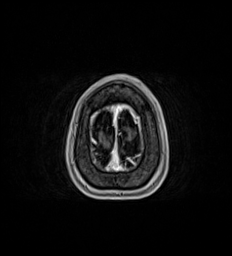
[im 174/174]
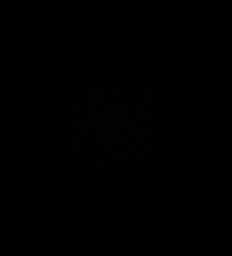

[Series 19: T1 post-contrast · coronal · 5.0mm · 0.57mm/px · 1 of 28 slices shown (2 of 4)]
[im 1/28]
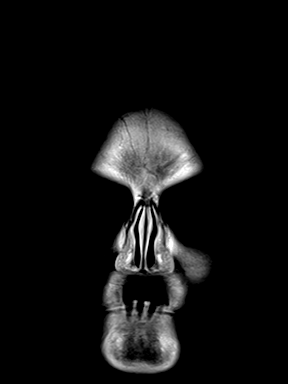

[Series 20: T1 post-contrast · sagittal · 5.0mm · 0.62mm/px · 1 of 23 slices shown (3 of 4)]
[im 1/23]
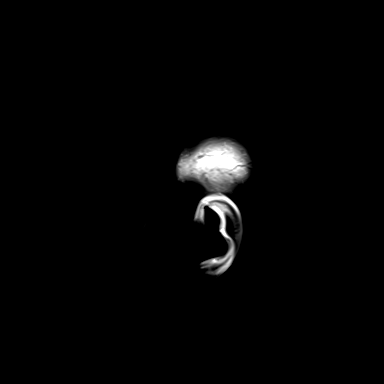

[Series 21: T1 post-contrast · axial · 1.0mm · 0.98mm/px · z∈[-93,+80]mm · 8 of 174 slices shown (4 of 4)]
[im 1/174]
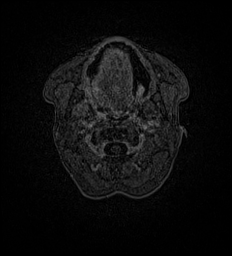
[im 25/174]
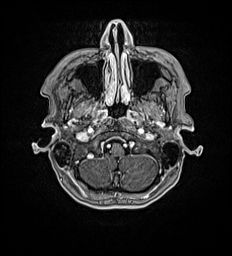
[im 50/174]
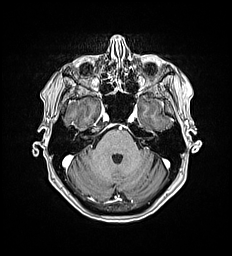
[im 75/174]
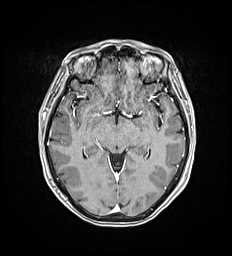
[im 99/174]
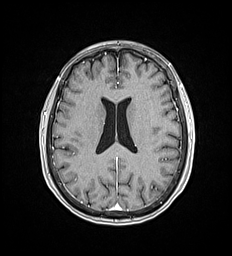
[im 124/174]
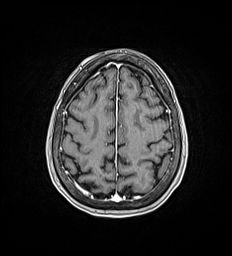
[im 149/174]
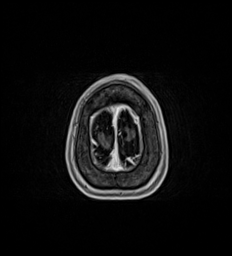
[im 174/174]
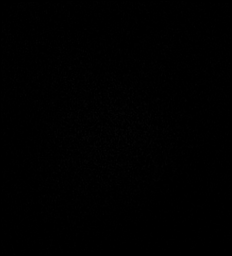

[48 of 48 positions shown; findings below may reference images not displayed]

FINDINGS: Brain: No acute infarction, hemorrhage, hydrocephalus or extra-axial
collection.

Few scattered foci of T2 hyperintensity are seen within the white
matter of the cerebral hemispheres, nonspecific, most likely related
to chronic small vessel ischemia.

A dural-based extra-axial enhancing lesion measuring 1.3 x 1.3 cm in
the left frontal region, consistent with a meningioma. No mass
effect in the adjacent brain parenchyma.

Vascular: Normal flow voids.

Skull and upper cervical spine: Normal marrow signal.

Sinuses/Orbits: Negative.

Other: Bilateral mastoid effusions.
IMPRESSION: 1. A 1.3 x 1.3 cm left frontal meningioma.
2. No acute intracranial abnormality.
3. Mild chronic small vessel ischemia.
4. Bilateral mastoid effusions.

## 2022-05-26 ENCOUNTER — Encounter (HOSPITAL_COMMUNITY): Payer: Medicare Other | Admitting: Physical Therapy

## 2022-05-31 ENCOUNTER — Encounter (HOSPITAL_COMMUNITY): Payer: Medicare Other | Admitting: Physical Therapy

## 2022-06-02 ENCOUNTER — Encounter (HOSPITAL_COMMUNITY): Payer: Medicare Other

## 2022-06-07 ENCOUNTER — Encounter (HOSPITAL_COMMUNITY): Payer: Medicare Other | Admitting: Physical Therapy

## 2022-06-09 ENCOUNTER — Encounter (HOSPITAL_COMMUNITY): Payer: Medicare Other

## 2022-08-17 ENCOUNTER — Ambulatory Visit
Admission: RE | Admit: 2022-08-17 | Discharge: 2022-08-17 | Disposition: A | Discharge status: Home / Self Care | Payer: Medicare Other | Source: Ambulatory Visit | Attending: Family Medicine | Admitting: Family Medicine

## 2022-08-17 DIAGNOSIS — Z1231 Encounter for screening mammogram for malignant neoplasm of breast: Secondary | ICD-10-CM | POA: Diagnosis present

## 2023-01-19 ENCOUNTER — Encounter: Payer: Self-pay | Admitting: Radiology

## 2023-01-24 ENCOUNTER — Other Ambulatory Visit: Payer: Self-pay | Admitting: Family Medicine

## 2023-01-24 DIAGNOSIS — Z1231 Encounter for screening mammogram for malignant neoplasm of breast: Secondary | ICD-10-CM

## 2023-06-28 ENCOUNTER — Ambulatory Visit: Payer: Medicare Other | Admitting: Dermatology

## 2023-07-13 ENCOUNTER — Ambulatory Visit: Payer: Medicare Other | Admitting: Dermatology

## 2023-08-25 ENCOUNTER — Ambulatory Visit
Admission: RE | Admit: 2023-08-25 | Discharge: 2023-08-25 | Disposition: A | Discharge status: Home / Self Care | Payer: Medicare Other | Source: Ambulatory Visit | Attending: Family Medicine | Admitting: Family Medicine

## 2023-08-25 DIAGNOSIS — Z1231 Encounter for screening mammogram for malignant neoplasm of breast: Secondary | ICD-10-CM

## 2023-12-07 ENCOUNTER — Ambulatory Visit: Payer: Medicare Other | Admitting: Dermatology

## 2023-12-28 ENCOUNTER — Ambulatory Visit: Payer: Medicare Other | Admitting: Dermatology

## 2024-01-10 ENCOUNTER — Ambulatory Visit (INDEPENDENT_AMBULATORY_CARE_PROVIDER_SITE_OTHER): Payer: Medicare Other | Admitting: Dermatology

## 2024-01-10 DIAGNOSIS — L578 Other skin changes due to chronic exposure to nonionizing radiation: Secondary | ICD-10-CM | POA: Diagnosis not present

## 2024-01-10 DIAGNOSIS — L570 Actinic keratosis: Secondary | ICD-10-CM | POA: Diagnosis not present

## 2024-01-10 DIAGNOSIS — L820 Inflamed seborrheic keratosis: Secondary | ICD-10-CM

## 2024-01-10 DIAGNOSIS — L814 Other melanin hyperpigmentation: Secondary | ICD-10-CM | POA: Diagnosis not present

## 2024-01-10 DIAGNOSIS — S0081XA Abrasion of other part of head, initial encounter: Secondary | ICD-10-CM

## 2024-01-10 DIAGNOSIS — D1801 Hemangioma of skin and subcutaneous tissue: Secondary | ICD-10-CM

## 2024-01-10 DIAGNOSIS — W908XXA Exposure to other nonionizing radiation, initial encounter: Secondary | ICD-10-CM

## 2024-01-10 DIAGNOSIS — Z1283 Encounter for screening for malignant neoplasm of skin: Secondary | ICD-10-CM

## 2024-01-10 DIAGNOSIS — D229 Melanocytic nevi, unspecified: Secondary | ICD-10-CM

## 2024-01-10 DIAGNOSIS — L821 Other seborrheic keratosis: Secondary | ICD-10-CM

## 2024-01-10 NOTE — Progress Notes (Signed)
Follow-Up Visit   Subjective  Victoria Dominguez is a 73 y.o. female who presents for the following: Skin Cancer Screening and Upper Body Skin Exam, no known history of skin cancer.   The patient presents for Upper Body Skin Exam (UBSE) for skin cancer screening and mole check. The patient has spots on her back and face to be evaluated, some may be new or changing and the patient may have concern these could be cancer.  She has several spots that are irritating and itchy on chest and back.    The following portions of the chart were reviewed this encounter and updated as appropriate: medications, allergies, medical history  Review of Systems:  No other skin or systemic complaints except as noted in HPI or Assessment and Plan.  Objective  Well appearing patient in no apparent distress; mood and affect are within normal limits.  All skin waist up examined. Relevant physical exam findings are noted in the Assessment and Plan.  left temple x 2, right lower cheek x 1, right temple x 1, left hand dorsum x 1, left upper sternum x 1, left sternum notch x 1 (7) Erythematous thin papules/macules with gritty scale.   Assessment & Plan   AK (ACTINIC KERATOSIS) (7) left temple x 2, right lower cheek x 1, right temple x 1, left hand dorsum x 1, left upper sternum x 1, left sternum notch x 1 (7) AK vrs ISK  Actinic keratoses are precancerous spots that appear secondary to cumulative UV radiation exposure/sun exposure over time. They are chronic with expected duration over 1 year. A portion of actinic keratoses will progress to squamous cell carcinoma of the skin. It is not possible to reliably predict which spots will progress to skin cancer and so treatment is recommended to prevent development of skin cancer.  Recommend daily broad spectrum sunscreen SPF 30+ to sun-exposed areas, reapply every 2 hours as needed.  Recommend staying in the shade or wearing long sleeves, sun glasses (UVA+UVB protection)  and wide brim hats (4-inch brim around the entire circumference of the hat). Call for new or changing lesions.  Destruction of lesion - left temple x 2, right lower cheek x 1, right temple x 1, left hand dorsum x 1, left upper sternum x 1, left sternum notch x 1 (7)  Destruction method: cryotherapy   Informed consent: discussed and consent obtained   Timeout:  patient name, date of birth, surgical site, and procedure verified Lesion destroyed using liquid nitrogen: Yes   Region frozen until ice ball extended beyond lesion: Yes   Outcome: patient tolerated procedure well with no complications   Post-procedure details: wound care instructions given   Additional details:  Prior to procedure, discussed risks of blister formation, small wound, skin dyspigmentation, or rare scar following cryotherapy. Recommend Vaseline ointment to treated areas while healing.  INFLAMED SEBORRHEIC KERATOSIS (3) left upper back x 2, right upper sternum x 1 (3) Symptomatic, irritating, patient would like treated.  Destruction of lesion - left upper back x 2, right upper sternum x 1 (3)  Destruction method: cryotherapy   Informed consent: discussed and consent obtained   Timeout:  patient name, date of birth, surgical site, and procedure verified Lesion destroyed using liquid nitrogen: Yes   Region frozen until ice ball extended beyond lesion: Yes   Outcome: patient tolerated procedure well with no complications   Post-procedure details: wound care instructions given   Additional details:  Prior to procedure, discussed risks of blister  formation, small wound, skin dyspigmentation, or rare scar following cryotherapy. Recommend Vaseline ointment to treated areas while healing.    Skin cancer screening performed today.  Actinic Damage Chest  - Chronic condition, secondary to cumulative UV/sun exposure - diffuse scaly erythematous macules with underlying dyspigmentation - Recommend daily broad spectrum sunscreen  SPF 30+ to sun-exposed areas, reapply every 2 hours as needed.  - Staying in the shade or wearing long sleeves, sun glasses (UVA+UVB protection) and wide brim hats (4-inch brim around the entire circumference of the hat) are also recommended for sun protection.  - Call for new or changing lesions.  Lentigines, Seborrheic Keratoses, Hemangiomas - Benign normal skin lesions Back, arms  - Benign-appearing - Call for any changes  Melanocytic Nevi - Tan-brown and/or pink-flesh-colored symmetric macules and papules - Benign appearing on exam today - Observation - Call clinic for new or changing moles - Recommend daily use of broad spectrum spf 30+ sunscreen to sun-exposed areas.   EXCORIATION  Right forehead- pink excoriated macule   Benign-appearing, observe.  Start plain Vaseline apply qd-bid until healed   Return in about 1 year (around 01/09/2025) for TBSE, hx of AKs .  I, Angelique Holm, CMA, am acting as scribe for Willeen Niece, MD .   Documentation: I have reviewed the above documentation for accuracy and completeness, and I agree with the above.  Willeen Niece, MD

## 2024-01-10 NOTE — Patient Instructions (Addendum)

## 2024-04-04 ENCOUNTER — Telehealth: Payer: Self-pay

## 2024-04-04 NOTE — Telephone Encounter (Signed)
 The patient called and left a voicemail requesting to schedule an appointment for an upset stomach lasting two weeks. I called the patient back and informed her that she is a patient of [facility]. I then transferred her to their office for further assistance.

## 2024-05-13 ENCOUNTER — Other Ambulatory Visit: Payer: Self-pay | Admitting: Family Medicine

## 2024-05-13 DIAGNOSIS — Z1231 Encounter for screening mammogram for malignant neoplasm of breast: Secondary | ICD-10-CM

## 2024-07-29 ENCOUNTER — Ambulatory Visit (HOSPITAL_COMMUNITY): Attending: Orthopedic Surgery

## 2024-07-29 ENCOUNTER — Other Ambulatory Visit: Payer: Self-pay

## 2024-07-29 ENCOUNTER — Encounter (HOSPITAL_COMMUNITY): Payer: Self-pay

## 2024-07-29 DIAGNOSIS — R202 Paresthesia of skin: Secondary | ICD-10-CM | POA: Diagnosis present

## 2024-07-29 DIAGNOSIS — G8929 Other chronic pain: Secondary | ICD-10-CM | POA: Diagnosis present

## 2024-07-29 DIAGNOSIS — M25511 Pain in right shoulder: Secondary | ICD-10-CM | POA: Insufficient documentation

## 2024-07-29 DIAGNOSIS — M542 Cervicalgia: Secondary | ICD-10-CM | POA: Diagnosis present

## 2024-07-29 DIAGNOSIS — R200 Anesthesia of skin: Secondary | ICD-10-CM | POA: Insufficient documentation

## 2024-07-29 NOTE — Therapy (Signed)
 OUTPATIENT PHYSICAL THERAPY CERVICAL EVALUATION   Patient Name: Victoria Dominguez MRN: [id number] DOB:[birth year], 73 y.o., female Today's Date: 07/29/2024  END OF SESSION:  PT End of Session - 07/29/24 0844     Visit Number 1    Date for PT Re-Evaluation 09/02/24    Authorization Type MEDICARE PART A AND B    Progress Note Due on Visit 8    PT Start Time 0800    PT Stop Time 0844    PT Time Calculation (min) 44 min    Activity Tolerance Patient tolerated treatment well;Patient limited by pain    Behavior During Therapy [facility] for tasks assessed/performed          Past Medical History:  Diagnosis Date   Hypertension    Past Surgical History:  Procedure Laterality Date   ABDOMINAL HYSTERECTOMY     CATARACT EXTRACTION W/PHACO Right 03/02/2021   Procedure: CATARACT EXTRACTION PHACO AND INTRAOCULAR LENS PLACEMENT (IOC) RIGHT 4.23 00:31.5;  Surgeon: Jaye Fallow, MD;  Location: [facility];  Service: Ophthalmology;  Laterality: Right;  COVID + 01-10-21   CATARACT EXTRACTION W/PHACO Left 03/16/2021   Procedure: CATARACT EXTRACTION PHACO AND INTRAOCULAR LENS PLACEMENT (IOC) LEFT 7.06 00:40.7;  Surgeon: Jaye Fallow, MD;  Location: [facility];  Service: Ophthalmology;  Laterality: Left;   ORIF WRIST FRACTURE Left 09/05/2019   Procedure: OPEN REDUCTION INTERNAL FIXATION (ORIF) WRIST FRACTURE;  Surgeon: Margrette Taft BRAVO, MD;  Location: [facility];  Service: Orthopedics;  Laterality: Left;   Patient Active Problem List   Diagnosis Date Noted   Osteoporosis 01/06/2020   Fracture of left wrist with routine healing 10/04/2019   Fracture, Colles, left, closed 09/09/2019   S/P ORIF (open reduction internal fixation) fracture 09/05/2019 left wrist  09/09/2019   Left wrist fracture    Hypertension 09/02/2019    PCP: Valora Agent, MD   REFERRING PROVIDER: Beverley Evalene BIRCH, MD  REFERRING DIAG: M54.2 (ICD-10-CM) - Cervicalgia  THERAPY DIAG:  Cervicalgia  Numbness  and tingling of right arm  Chronic right shoulder pain  Rationale for Evaluation and Treatment: Rehabilitation  ONSET DATE: been going on for a while, 3-4 months ago it has gotten worse.  SUBJECTIVE:                                                                                                                                                                                                         SUBJECTIVE STATEMENT: Pt states neck pain has been going on for a while now but has worsening in the last few months.  States she went to doc for Cortizone shot but wanted her to do therapy first. Pt states pain gets so bad she can hardly lift arm (right). Pt as broken both wrists in the past and wonders if the nerve blocks on RUE could have something to do with the numbness sensation she has all the way down the arm to the hand almost all the time. Pt states she would like to decrease the pain and perform ADLs that require lifting overhead with less pain.     Hand dominance: Right  PERTINENT HISTORY:  Has broken both wrists  PAIN:  Are you having pain? Yes: NPRS scale: 4/10 Pain location: lower cervical region to upper trap and shoulder, all the way down to the hand, primarily medial 3 fingers Pain description: burning, shooting pain Aggravating factors: none reported Relieving factors: alieve, heat, extension, biofreeze   PRECAUTIONS: None  RED FLAGS: None     WEIGHT BEARING RESTRICTIONS: No  FALLS:  Has patient fallen in last 6 months? 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  NEXT MD VISIT: end of September  OBJECTIVE:  Note: Objective measures were completed at Evaluation unless otherwise noted.  DIAGNOSTIC FINDINGS:  None  PATIENT SURVEYS:  NDI: 7/50  COGNITION: Overall cognitive status: Within functional  limits for tasks assessed  SENSATION: Light touch: Impaired , right arm consistently numb to the hand  POSTURE: rounded shoulders and forward head  PALPATION: Tenderness to palpation to paraspinals of cervical spine especially of right side and upper trap region.   CERVICAL ROM:   Active ROM A/PROM (deg) eval  Flexion 55, slight pain  Extension 35, pain worse than flexion  Right lateral flexion 25, increased pain  Left lateral flexion 40, no pain  Right rotation 64, worse to the right  Left rotation 46, pain   (Blank rows = not tested)  UPPER EXTREMITY ROM:  Active ROM Right eval Left eval  Shoulder flexion    Shoulder extension    Shoulder abduction    Shoulder adduction Bad pain   Shoulder extension    Shoulder internal rotation    Shoulder external rotation    Elbow flexion    Elbow extension    Wrist flexion    Wrist extension    Wrist ulnar deviation    Wrist radial deviation    Wrist pronation    Wrist supination     (Blank rows = not tested)  UPPER EXTREMITY MMT:  MMT Right eval Left eval  Shoulder flexion 4 4  Shoulder extension    Shoulder abduction 4-, pain 4  Shoulder adduction    Shoulder extension 4 4  Shoulder internal rotation 4, slight pain 4  Shoulder external rotation 4, slight pain 4  Middle trapezius    Lower trapezius    Elbow flexion 4+ 4+  Elbow extension 4+ 4+  Wrist flexion    Wrist extension    Wrist ulnar deviation    Wrist radial deviation    Wrist pronation    Wrist supination    Grip strength WNL WNL   (Blank rows = not tested)  CERVICAL SPECIAL TESTS:  Spurling's test: Positive and Distraction test: Positive Right side   TREATMENT DATE:  07/29/2024   Evaluation: -ROM measured, Strength assessed, HEP prescribed, pt educated on prognosis, findings, and importance of HEP compliance if given.  PATIENT EDUCATION:  Education details: Pt was educated on findings of PT evaluation, prognosis, frequency of therapy visits and rationale, attendance policy, and HEP if given.   Person educated: Patient Education method: Explanation, Verbal cues, and Handouts Education comprehension: verbalized understanding, verbal cues required, and needs further education  HOME EXERCISE PROGRAM: Access Code: [email or web address] URL: [email or web address] Date: 07/29/2024 Prepared by: Lang Ada  Exercises - Seated Scapular Retraction  - 1 x daily - 7 x weekly - 3 sets - 10 reps - 3 hold - Seated Cervical Retraction  - 1 x daily - 7 x weekly - 3 sets - 10 reps - 3 hold - Seated Upper Trapezius Stretch  - 1 x daily - 7 x weekly - 1 sets - 3 reps - 20 hold  ASSESSMENT:  CLINICAL IMPRESSION: Patient is a 73 y.o. female who was seen today for physical therapy evaluation and treatment for M54.2 (ICD-10-CM) - Cervicalgia.   Patient demonstrates decreased cervical spine ROM, increased pain in neck, headaches, and increased tension in bilateral upper trapezius and paraspinals of cervical spine. Patient also suffering from increased frequency and intensity of headaches 2-3 times per week. Patient also demonstrates tenderness to C3-C7 vertebrae CPAs and UPAs especially on the R side of spine. Patient requires moderate verbal cuing for relaxation of cervical spine musculature with difficulty during HEP instruction. Patient would benefit from skilled physical therapy for decreased headache frequency and intensity, increased pain free ROM of cervical spine, increased strength in paraspinal and other postural musculature, and improved posture for improved ability to perform ADLs without symptom reproduction, return to higher level of function with ADLs, and progress towards therapy goals.   OBJECTIVE IMPAIRMENTS: decreased activity tolerance, decreased endurance, decreased mobility, decreased ROM,  decreased strength, impaired sensation, and pain.   ACTIVITY LIMITATIONS: carrying, lifting, and reach over head  PARTICIPATION LIMITATIONS: meal prep, cleaning, laundry, shopping, community activity, and yard work  PERSONAL FACTORS: Age, Fitness, and Time since onset of injury/illness/exacerbation are also affecting patient's functional outcome.   REHAB POTENTIAL: Fair Chronic in nature  CLINICAL DECISION MAKING: Stable/uncomplicated  EVALUATION COMPLEXITY: Low   GOALS: Goals reviewed with patient? No  SHORT TERM GOALS: Target date: 08/19/24  Pt will be independent with HEP in order to demonstrate participation in Physical Therapy POC.  Baseline: Goal status: INITIAL  2.  Pt will report 2/10 pain with cervical mobility in order to demonstrate improved pain with ADLs.  Baseline:  Goal status: INITIAL  LONG TERM GOALS: Target date: 09/09/24  Pt will report decreased cervicogenic caused headaches to less than 2 per week in order to demonstrate improved quality of life.  Baseline: 2-3x per week.  Goal status: INITIAL  2.  Pt will improve cervical ROM (flex/ext/lateral flexion/rotation) by 20 degrees combined in order to demonstrate improved functional ambulatory capacity in community setting.  Baseline: see objective.  Goal status: INITIAL  3.  Pt will improve NDI score to 0 in order to demonstrate decreased pain with functional goals and outcomes. Baseline: see objective.  Goal status: INITIAL  4.  Pt will report 1/10 pain with cervical mobility in order to demonstrate reduced pain with ADLs that require use of cervical spine musculature (driving, washing hair, reaching to elevated cabinet).  Baseline: see objective.  Goal status: INITIAL     PLAN:  PT FREQUENCY: 1-2x/week  PT DURATION: 6 weeks  PLANNED INTERVENTIONS: 97110-Therapeutic exercises, 97530- Therapeutic activity, [id number]- Neuromuscular re-education, 97535- Self Care, [id number]- Manual therapy, Patient/Family  education, Spinal mobilization, Cryotherapy, and  Moist heat  PLAN FOR NEXT SESSION: continue manual traction, and STM of right upper trap, progress cervical and scapular mobility, progress stretching of right upper extremity, neck, and quadrant, formal assessment of trapezius musculature   Lang Ada, PT, DPT [facility] Office: [phone] 11:00 AM, 07/29/24

## 2024-07-30 ENCOUNTER — Ambulatory Visit (HOSPITAL_COMMUNITY)

## 2024-07-31 ENCOUNTER — Encounter (HOSPITAL_COMMUNITY): Payer: Self-pay

## 2024-07-31 ENCOUNTER — Ambulatory Visit (HOSPITAL_COMMUNITY)

## 2024-07-31 DIAGNOSIS — G8929 Other chronic pain: Secondary | ICD-10-CM

## 2024-07-31 DIAGNOSIS — R200 Anesthesia of skin: Secondary | ICD-10-CM

## 2024-07-31 DIAGNOSIS — M542 Cervicalgia: Secondary | ICD-10-CM | POA: Diagnosis not present

## 2024-07-31 NOTE — Therapy (Signed)
 OUTPATIENT PHYSICAL THERAPY CERVICAL EVALUATION   Patient Name: Victoria Dominguez MRN: [id number] DOB:07-24-1951, 73 y.o., female Today's Date: 07/31/2024  END OF SESSION:  PT End of Session - 07/31/24 1344     Visit Number 2    Date for PT Re-Evaluation 09/02/24    Authorization Type MEDICARE PART A AND B    Authorization Time Period no auth    Progress Note Due on Visit 8    PT Start Time 1344    PT Stop Time 1425    PT Time Calculation (min) 41 min    Activity Tolerance Patient tolerated treatment well;Patient limited by pain    Behavior During Therapy WFL for tasks assessed/performed           Past Medical History:  Diagnosis Date   Hypertension    Past Surgical History:  Procedure Laterality Date   ABDOMINAL HYSTERECTOMY     CATARACT EXTRACTION W/PHACO Right 03/02/2021   Procedure: CATARACT EXTRACTION PHACO AND INTRAOCULAR LENS PLACEMENT (IOC) RIGHT 4.23 00:31.5;  Surgeon: Jaye Fallow, MD;  Location: [facility];  Service: Ophthalmology;  Laterality: Right;  COVID + 01-10-21   CATARACT EXTRACTION W/PHACO Left 03/16/2021   Procedure: CATARACT EXTRACTION PHACO AND INTRAOCULAR LENS PLACEMENT (IOC) LEFT 7.06 00:40.7;  Surgeon: Jaye Fallow, MD;  Location: [facility];  Service: Ophthalmology;  Laterality: Left;   ORIF WRIST FRACTURE Left 09/05/2019   Procedure: OPEN REDUCTION INTERNAL FIXATION (ORIF) WRIST FRACTURE;  Surgeon: Margrette Taft BRAVO, MD;  Location: [facility];  Service: Orthopedics;  Laterality: Left;   Patient Active Problem List   Diagnosis Date Noted   Osteoporosis 01/06/2020   Fracture of left wrist with routine healing 10/04/2019   Fracture, Colles, left, closed 09/09/2019   S/P ORIF (open reduction internal fixation) fracture 09/05/2019 left wrist  09/09/2019   Left wrist fracture    Hypertension 09/02/2019    PCP: Valora Agent, MD   REFERRING PROVIDER: Beverley Evalene BIRCH, MD  REFERRING DIAG: M54.2 (ICD-10-CM) -  Cervicalgia  THERAPY DIAG:  Cervicalgia  Numbness and tingling of right arm  Chronic right shoulder pain  Rationale for Evaluation and Treatment: Rehabilitation  ONSET DATE: been going on for a while, 3-4 months ago it has gotten worse.  SUBJECTIVE:                                                                                                                                                                                                         SUBJECTIVE STATEMENT: Pt states she is still dealing with neck/shoulder pain, 5/10 today.  Pt states the only exercise she has been having difficulty with is chin tuck.  Eval: Pt states neck pain has been going on for a while now but has worsening in the last few months. States she went to doc for Cortizone shot but wanted her to do therapy first. Pt states pain gets so bad she can hardly lift arm (right). Pt as broken both wrists in the past and wonders if the nerve blocks on RUE could have something to do with the numbness sensation she has all the way down the arm to the hand almost all the time. Pt states she would like to decrease the pain and perform ADLs that require lifting overhead with less pain.     Hand dominance: Right  PERTINENT HISTORY:  Has broken both wrists  PAIN:  Are you having pain? Yes: NPRS scale: 4/10 Pain location: lower cervical region to upper trap and shoulder, all the way down to the hand, primarily medial 3 fingers Pain description: burning, shooting pain Aggravating factors: none reported Relieving factors: alieve, heat, extension, biofreeze   PRECAUTIONS: None  RED FLAGS: None     WEIGHT BEARING RESTRICTIONS: No  FALLS:  Has patient fallen in last 6 months? 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  NEXT MD VISIT: end of  September  OBJECTIVE:  Note: Objective measures were completed at Evaluation unless otherwise noted.  DIAGNOSTIC FINDINGS:  None  PATIENT SURVEYS:  NDI: 7/50  COGNITION: Overall cognitive status: Within functional limits for tasks assessed  SENSATION: Light touch: Impaired , right arm consistently numb to the hand  POSTURE: rounded shoulders and forward head  PALPATION: Tenderness to palpation to paraspinals of cervical spine especially of right side and upper trap region.   CERVICAL ROM:   Active ROM A/PROM (deg) eval  Flexion 55, slight pain  Extension 35, pain worse than flexion  Right lateral flexion 25, increased pain  Left lateral flexion 40, no pain  Right rotation 64, worse to the right  Left rotation 46, pain   (Blank rows = not tested)  UPPER EXTREMITY ROM:  Active ROM Right eval Left eval  Shoulder flexion    Shoulder extension    Shoulder abduction    Shoulder adduction Bad pain   Shoulder extension    Shoulder internal rotation    Shoulder external rotation    Elbow flexion    Elbow extension    Wrist flexion    Wrist extension    Wrist ulnar deviation    Wrist radial deviation    Wrist pronation    Wrist supination     (Blank rows = not tested)  UPPER EXTREMITY MMT:  MMT Right eval Left eval  Shoulder flexion 4 4  Shoulder extension    Shoulder abduction 4-, pain 4  Shoulder adduction    Shoulder extension 4 4  Shoulder internal rotation 4, slight pain 4  Shoulder external rotation 4, slight pain 4  Middle trapezius    Lower trapezius    Elbow flexion 4+ 4+  Elbow extension 4+ 4+  Wrist flexion    Wrist extension    Wrist ulnar deviation    Wrist radial deviation    Wrist pronation    Wrist supination    Grip strength WNL WNL   (Blank rows = not tested)  CERVICAL SPECIAL TESTS:  Spurling's test:  Positive and Distraction test: Positive Right side   TREATMENT DATE:  07/31/2024  Manual Therapy: -STM of Cervical  Paraspinal musculature Therapeutic Exercise: -UBE bike, 1 minute 47 seconds forward, 1 minute bwd, pt reports increased pain in rihgt neck and shoulder -Standing shoulder rows, RTB at chest level, 2 sets of 10 reps, pt cued for LE positing and scapular retraction -Standing shoulder extensions, RTB at chest level, 2 sets of 10 reps, pt cued for LE positing and scapular retraction -Seated Lat pull downs plate 3, pt cued for eccentric control, 2 sets of 10 reps -Standing D2 flexion, 2 sets of 10 reps, pt cued for eccentric control, RTB on lowest web slide setting -UT stretch seated on mat table, 2 sets of 30 second holds, pt requires verbal and tactile cues for proper form -Levator stretch seated on mat table, 2 sets of 30 second holds, pt requires verbal and tactile cues for proper form Neuromuscular Re-education: -Cat cow stretch with cervical ROM, 1 set of 8 reps, pt requires verbal and tactile cues for proper sequencing -Thread the needle with cervical ROM, pt cued for increased ROM, 1 set of 10 reps    07/29/2024   Evaluation: -ROM measured, Strength assessed, HEP prescribed, pt educated on prognosis, findings, and importance of HEP compliance if given.                                                                                                                                   PATIENT EDUCATION:  Education details: Pt was educated on findings of PT evaluation, prognosis, frequency of therapy visits and rationale, attendance policy, and HEP if given.   Person educated: Patient Education method: Explanation, Verbal cues, and Handouts Education comprehension: verbalized understanding, verbal cues required, and needs further education  HOME EXERCISE PROGRAM: Access Code: [email or web address] URL: [email or web address] Date: 07/29/2024 Prepared by: Lang Ada  Exercises - Seated Scapular Retraction  - 1 x daily - 7 x weekly - 3 sets - 10 reps - 3 hold - Seated Cervical  Retraction  - 1 x daily - 7 x weekly - 3 sets - 10 reps - 3 hold - Seated Upper Trapezius Stretch  - 1 x daily - 7 x weekly - 1 sets - 3 reps - 20 hold  ASSESSMENT:  CLINICAL IMPRESSION: Patient continues to demonstrate increased pain in Right neck/shoulder region, decreased UE strength, and decreased cervical mobility. Patient also demonstrates decreased tolerance to endurance based activity during today's session with cessation of UE bike due to increased symptoms. Patient able to initiate dynamic mobility and core activation exercises today with cat cow and thread the needle stretch with cervical ROM, good performance with verbal cueing. Patient would continue to benefit from skilled physical therapy for increased endurance with cervical and UE mobility, decreased neck and right shoulder pain for improved quality of life, improved independence with symptoms originating from neck and continued progress towards therapy goals.  Eval: Patient is a 73 y.o. female who was seen today for physical therapy evaluation and treatment for M54.2 (ICD-10-CM) - Cervicalgia. Patient demonstrates decreased cervical spine ROM, increased pain in neck, headaches, and increased tension in bilateral upper trapezius and paraspinals of cervical spine. Patient also suffering from increased frequency and intensity of headaches 2-3 times per week. Patient also demonstrates tenderness to C3-C7 vertebrae CPAs and UPAs especially on the R side of spine. Patient requires moderate verbal cuing for relaxation of cervical spine musculature with difficulty during HEP instruction. Patient would benefit from skilled physical therapy for decreased headache frequency and intensity, increased pain free ROM of cervical spine, increased strength in paraspinal and other postural musculature, and improved posture for improved ability to perform ADLs without symptom reproduction, return to higher level of function with ADLs, and progress towards  therapy goals.   OBJECTIVE IMPAIRMENTS: decreased activity tolerance, decreased endurance, decreased mobility, decreased ROM, decreased strength, impaired sensation, and pain.   ACTIVITY LIMITATIONS: carrying, lifting, and reach over head  PARTICIPATION LIMITATIONS: meal prep, cleaning, laundry, shopping, community activity, and yard work  PERSONAL FACTORS: Age, Fitness, and Time since onset of injury/illness/exacerbation are also affecting patient's functional outcome.   REHAB POTENTIAL: Fair Chronic in nature  CLINICAL DECISION MAKING: Stable/uncomplicated  EVALUATION COMPLEXITY: Low   GOALS: Goals reviewed with patient? No  SHORT TERM GOALS: Target date: 08/19/24  Pt will be independent with HEP in order to demonstrate participation in Physical Therapy POC.  Baseline: Goal status: INITIAL  2.  Pt will report 2/10 pain with cervical mobility in order to demonstrate improved pain with ADLs.  Baseline:  Goal status: INITIAL  LONG TERM GOALS: Target date: 09/09/24  Pt will report decreased cervicogenic caused headaches to less than 2 per week in order to demonstrate improved quality of life.  Baseline: 2-3x per week.  Goal status: INITIAL  2.  Pt will improve cervical ROM (flex/ext/lateral flexion/rotation) by 20 degrees combined in order to demonstrate improved functional ambulatory capacity in community setting.  Baseline: see objective.  Goal status: INITIAL  3.  Pt will improve NDI score to 0 in order to demonstrate decreased pain with functional goals and outcomes. Baseline: see objective.  Goal status: INITIAL  4.  Pt will report 1/10 pain with cervical mobility in order to demonstrate reduced pain with ADLs that require use of cervical spine musculature (driving, washing hair, reaching to elevated cabinet).  Baseline: see objective.  Goal status: INITIAL     PLAN:  PT FREQUENCY: 1-2x/week  PT DURATION: 6 weeks  PLANNED INTERVENTIONS: 97110-Therapeutic  exercises, 97530- Therapeutic activity, 97112- Neuromuscular re-education, 97535- Self Care, 02859- Manual therapy, Patient/Family education, Spinal mobilization, Cryotherapy, and Moist heat  PLAN FOR NEXT SESSION: continue manual traction, and STM of right upper trap, progress cervical and scapular mobility, progress stretching of right upper extremity, neck, and quadrant, formal assessment of trapezius musculature   Lang Ada, PT, DPT [facility] Office: [phone] 2:33 PM, 07/31/24

## 2024-08-05 ENCOUNTER — Encounter (HOSPITAL_COMMUNITY): Payer: Self-pay

## 2024-08-05 ENCOUNTER — Ambulatory Visit (HOSPITAL_COMMUNITY)

## 2024-08-05 DIAGNOSIS — M542 Cervicalgia: Secondary | ICD-10-CM

## 2024-08-05 DIAGNOSIS — G8929 Other chronic pain: Secondary | ICD-10-CM

## 2024-08-05 DIAGNOSIS — R200 Anesthesia of skin: Secondary | ICD-10-CM

## 2024-08-05 NOTE — Therapy (Signed)
 OUTPATIENT PHYSICAL THERAPY CERVICAL TREATMENT   Patient Name: Victoria Dominguez MRN: [id number] DOB:[birth year], 73 y.o., female Today's Date: 08/05/2024  END OF SESSION:  PT End of Session - 08/05/24 0931     Visit Number 3    Date for PT Re-Evaluation 09/02/24    Authorization Type MEDICARE PART A AND B    Authorization Time Period no auth    Progress Note Due on Visit 8    PT Start Time 0932    PT Stop Time 1015    PT Time Calculation (min) 43 min    Activity Tolerance Patient tolerated treatment well    Behavior During Therapy WFL for tasks assessed/performed           Past Medical History:  Diagnosis Date   Hypertension    Past Surgical History:  Procedure Laterality Date   ABDOMINAL HYSTERECTOMY     CATARACT EXTRACTION W/PHACO Right 03/02/2021   Procedure: CATARACT EXTRACTION PHACO AND INTRAOCULAR LENS PLACEMENT (IOC) RIGHT 4.23 00:31.5;  Surgeon: Jaye Fallow, MD;  Location: [facility];  Service: Ophthalmology;  Laterality: Right;  COVID + 01-10-21   CATARACT EXTRACTION W/PHACO Left 03/16/2021   Procedure: CATARACT EXTRACTION PHACO AND INTRAOCULAR LENS PLACEMENT (IOC) LEFT 7.06 00:40.7;  Surgeon: Jaye Fallow, MD;  Location: [facility];  Service: Ophthalmology;  Laterality: Left;   ORIF WRIST FRACTURE Left 09/05/2019   Procedure: OPEN REDUCTION INTERNAL FIXATION (ORIF) WRIST FRACTURE;  Surgeon: Margrette Taft BRAVO, MD;  Location: [facility];  Service: Orthopedics;  Laterality: Left;   Patient Active Problem List   Diagnosis Date Noted   Osteoporosis 01/06/2020   Fracture of left wrist with routine healing 10/04/2019   Fracture, [place], left, closed 09/09/2019   S/P ORIF (open reduction internal fixation) fracture 09/05/2019 left wrist  09/09/2019   Left wrist fracture    Hypertension 09/02/2019    PCP: Valora Agent, MD   REFERRING PROVIDER: Beverley Evalene BIRCH, MD  REFERRING DIAG: M54.2 (ICD-10-CM) - Cervicalgia  THERAPY DIAG:   Cervicalgia  Numbness and tingling of right arm  Chronic right shoulder pain  Rationale for Evaluation and Treatment: Rehabilitation  ONSET DATE: been going on for a while, 3-4 months ago it has gotten worse.  SUBJECTIVE:                                                                                                                                                                                                         SUBJECTIVE STATEMENT: Pt reports she has some slight soreness in neck today and RUE. At about  a 4/10 this morning. Reports HEP going well. Chin tuck motion still hard to get with the coordination. Reports R arm pain is a throbbing pain.    Eval: Pt states neck pain has been going on for a while now but has worsening in the last few months. States she went to doc for Cortizone shot but wanted her to do therapy first. Pt states pain gets so bad she can hardly lift arm (right). Pt as broken both wrists in the past and wonders if the nerve blocks on RUE could have something to do with the numbness sensation she has all the way down the arm to the hand almost all the time. Pt states she would like to decrease the pain and perform ADLs that require lifting overhead with less pain.     Hand dominance: Right  PERTINENT HISTORY:  Has broken both wrists  PAIN:  Are you having pain? Yes: NPRS scale: 4/10 Pain location: lower cervical region to upper trap and shoulder, all the way down to the hand, primarily medial 3 fingers Pain description: burning, shooting pain Aggravating factors: none reported Relieving factors: alieve, heat, extension, biofreeze   PRECAUTIONS: None  RED FLAGS: None     WEIGHT BEARING RESTRICTIONS: No  FALLS:  Has patient fallen in last 6 months? No  OCCUPATION: Retired, office work in [place]   PLOF: Independent and Independent with basic ADLs  PATIENT GOALS: decrease the pain and numbness in right arm, overhead activity performance  improvement.  NEXT MD VISIT: end of September  OBJECTIVE:  Note: Objective measures were completed at Evaluation unless otherwise noted.  DIAGNOSTIC FINDINGS:  None  PATIENT SURVEYS:  NDI: 7/50  COGNITION: Overall cognitive status: Within functional limits for tasks assessed  SENSATION: Light touch: Impaired , right arm consistently numb to the hand  POSTURE: rounded shoulders and forward head  PALPATION: Tenderness to palpation to paraspinals of cervical spine especially of right side and upper trap region.   CERVICAL ROM:   Active ROM A/PROM (deg) eval  Flexion 55, slight pain  Extension 35, pain worse than flexion  Right lateral flexion 25, increased pain  Left lateral flexion 40, no pain  Right rotation 64, worse to the right  Left rotation 46, pain   (Blank rows = not tested)  UPPER EXTREMITY ROM:  Active ROM Right eval Left eval  Shoulder flexion    Shoulder extension    Shoulder abduction    Shoulder adduction Bad pain   Shoulder extension    Shoulder internal rotation    Shoulder external rotation    Elbow flexion    Elbow extension    Wrist flexion    Wrist extension    Wrist ulnar deviation    Wrist radial deviation    Wrist pronation    Wrist supination     (Blank rows = not tested)  UPPER EXTREMITY MMT:  MMT Right eval Left eval  Shoulder flexion 4 4  Shoulder extension    Shoulder abduction 4-, pain 4  Shoulder adduction    Shoulder extension 4 4  Shoulder internal rotation 4, slight pain 4  Shoulder external rotation 4, slight pain 4  Middle trapezius    Lower trapezius    Elbow flexion 4+ 4+  Elbow extension 4+ 4+  Wrist flexion    Wrist extension    Wrist ulnar deviation    Wrist radial deviation    Wrist pronation    Wrist supination    Grip strength WNL WNL   (  Blank rows = not tested)  CERVICAL SPECIAL TESTS:  Spurling's test: Positive and Distraction test: Positive Right side   TREATMENT DATE:  08/05/24: UBE  bike, 2 minutes forward, 2 minute bwd, pt reports increased pain in R shoulder, requiring breaks, levels 1-2 Shoulder pendulum, RUE, 2' Shoulder Rows, RTB, 2x10 Shoulder Extension, RTB, 2x10 Posterior shoulder rolls, 2' Ts and Xs, 15 x of each with RTB STM of R Cervical Paraspinal musculature Upper Trap stretch, 30 holds Levator Scap stretch, 30    07/31/2024  Manual Therapy: -STM of Cervical Paraspinal musculature Therapeutic Exercise: -UBE bike, 1 minute 47 seconds forward, 1 minute bwd, pt reports increased pain in rihgt neck and shoulder -Standing shoulder rows, RTB at chest level, 2 sets of 10 reps, pt cued for LE positing and scapular retraction -Standing shoulder extensions, RTB at chest level, 2 sets of 10 reps, pt cued for LE positing and scapular retraction -Seated Lat pull downs plate 3, pt cued for eccentric control, 2 sets of 10 reps -Standing D2 flexion, 2 sets of 10 reps, pt cued for eccentric control, RTB on lowest web slide setting -UT stretch seated on mat table, 2 sets of 30 second holds, pt requires verbal and tactile cues for proper form -Levator stretch seated on mat table, 2 sets of 30 second holds, pt requires verbal and tactile cues for proper form Neuromuscular Re-education: -Cat cow stretch with cervical ROM, 1 set of 8 reps, pt requires verbal and tactile cues for proper sequencing -Thread the needle with cervical ROM, pt cued for increased ROM, 1 set of 10 reps   07/29/2024  Evaluation: -ROM measured, Strength assessed, HEP prescribed, pt educated on prognosis, findings, and importance of HEP compliance if given.   PATIENT EDUCATION:  Education details: Pt was educated on findings of PT evaluation, prognosis, frequency of therapy visits and rationale, attendance policy, and HEP if given.   Person educated: Patient Education method: Explanation, Verbal cues, and Handouts Education comprehension: verbalized understanding, verbal cues required, and  needs further education  HOME EXERCISE PROGRAM: Access Code: [email or web address] URL: [email or web address] Date: 07/29/2024 Prepared by: Lang Ada  Exercises - Seated Scapular Retraction  - 1 x daily - 7 x weekly - 3 sets - 10 reps - 3 hold - Seated Cervical Retraction  - 1 x daily - 7 x weekly - 3 sets - 10 reps - 3 hold - Seated Upper Trapezius Stretch  - 1 x daily - 7 x weekly - 1 sets - 3 reps - 20 hold    Exercises - Standing Shoulder Horizontal Abduction with Resistance  - 2 x daily - 7 x weekly - 3 sets - 10 reps - Standing Shoulder Diagonal Horizontal Abduction 60/120 Degrees with Resistance  - 2 x daily - 7 x weekly - 3 sets - 10 reps - Seated Upper Trapezius Stretch  - 2 x daily - 7 x weekly - 3 sets - 30 hold    ASSESSMENT:  CLINICAL IMPRESSION: Patient arrives to PT with reports of continued cervical and R shoulder pain. Began session with dynamic warm up on UBE. Patient has to take multiple quick rest breaks during due to throbbing pain. Followed with shoulder pendulums to allow passive distraction and stretch, pt requiring consistent verbal cueing throughout. Postural re-education with resistance. Added resisted horizontal abduction and diagonal horizontal abduction to HEP as pt tolerated well in session. Shoulder rolls during session as well for relaxation as pt reports inc throbbing pain following strengthening tasks. Ended with  STM and stretching, with emphasis to R upper traps and cervical musculature. Patient would continue to benefit from skilled physical therapy for increased endurance with cervical and UE mobility, decreased neck and right shoulder pain for improved quality of life     Eval: Patient is a 73 y.o. female who was seen today for physical therapy evaluation and treatment for M54.2 (ICD-10-CM) - Cervicalgia. Patient demonstrates decreased cervical spine ROM, increased pain in neck, headaches, and increased tension in bilateral upper trapezius and  paraspinals of cervical spine. Patient also suffering from increased frequency and intensity of headaches 2-3 times per week. Patient also demonstrates tenderness to C3-C7 vertebrae CPAs and UPAs especially on the R side of spine. Patient requires moderate verbal cuing for relaxation of cervical spine musculature with difficulty during HEP instruction. Patient would benefit from skilled physical therapy for decreased headache frequency and intensity, increased pain free ROM of cervical spine, increased strength in paraspinal and other postural musculature, and improved posture for improved ability to perform ADLs without symptom reproduction, return to higher level of function with ADLs, and progress towards therapy goals.   OBJECTIVE IMPAIRMENTS: decreased activity tolerance, decreased endurance, decreased mobility, decreased ROM, decreased strength, impaired sensation, and pain.   ACTIVITY LIMITATIONS: carrying, lifting, and reach over head  PARTICIPATION LIMITATIONS: meal prep, cleaning, laundry, shopping, community activity, and yard work  PERSONAL FACTORS: Age, Fitness, and Time since onset of injury/illness/exacerbation are also affecting patient's functional outcome.   REHAB POTENTIAL: Fair Chronic in nature  CLINICAL DECISION MAKING: Stable/uncomplicated  EVALUATION COMPLEXITY: Low   GOALS: Goals reviewed with patient? No  SHORT TERM GOALS: Target date: 08/19/24  Pt will be independent with HEP in order to demonstrate participation in Physical Therapy POC.  Baseline: Goal status: INITIAL  2.  Pt will report 2/10 pain with cervical mobility in order to demonstrate improved pain with ADLs.  Baseline:  Goal status: INITIAL  LONG TERM GOALS: Target date: 09/09/24  Pt will report decreased cervicogenic caused headaches to less than 2 per week in order to demonstrate improved quality of life.  Baseline: 2-3x per week.  Goal status: INITIAL  2.  Pt will improve cervical ROM  (flex/ext/lateral flexion/rotation) by 20 degrees combined in order to demonstrate improved functional ambulatory capacity in community setting.  Baseline: see objective.  Goal status: INITIAL  3.  Pt will improve NDI score to 0 in order to demonstrate decreased pain with functional goals and outcomes. Baseline: see objective.  Goal status: INITIAL  4.  Pt will report 1/10 pain with cervical mobility in order to demonstrate reduced pain with ADLs that require use of cervical spine musculature (driving, washing hair, reaching to elevated cabinet).  Baseline: see objective.  Goal status: INITIAL     PLAN:  PT FREQUENCY: 1-2x/week  PT DURATION: 6 weeks  PLANNED INTERVENTIONS: 97110-Therapeutic exercises, 97530- Therapeutic activity, 97112- Neuromuscular re-education, 97535- Self Care, [id number]- Manual therapy, Patient/Family education, Spinal mobilization, Cryotherapy, and Moist heat  PLAN FOR NEXT SESSION: continue manual traction, and STM of right upper trap, progress cervical and scapular mobility, progress stretching of right upper extremity, neck, and quadrant, formal assessment of trapezius musculature   10:25 AM, 08/05/24 Rosaria Settler, PT, DPT [facility] [place]

## 2024-08-08 ENCOUNTER — Ambulatory Visit (HOSPITAL_COMMUNITY): Admitting: Physical Therapy

## 2024-08-08 DIAGNOSIS — M542 Cervicalgia: Secondary | ICD-10-CM | POA: Diagnosis not present

## 2024-08-08 DIAGNOSIS — G8929 Other chronic pain: Secondary | ICD-10-CM

## 2024-08-08 DIAGNOSIS — R200 Anesthesia of skin: Secondary | ICD-10-CM

## 2024-08-08 NOTE — Therapy (Signed)
 OUTPATIENT PHYSICAL THERAPY CERVICAL TREATMENT   Patient Name: Victoria Dominguez MRN: [id number] DOB:05/09/1951, 73 y.o., female Today's Date: 08/08/2024  END OF SESSION:  PT End of Session - 08/08/24 0801     Visit Number 4    Date for Recertification  09/02/24    Authorization Type MEDICARE PART A AND B    Authorization Time Period no auth    Progress Note Due on Visit 8    PT Start Time 0801    PT Stop Time 0842    PT Time Calculation (min) 41 min    Activity Tolerance Patient tolerated treatment well    Behavior During Therapy [facility] for tasks assessed/performed           Past Medical History:  Diagnosis Date   Hypertension    Past Surgical History:  Procedure Laterality Date   ABDOMINAL HYSTERECTOMY     CATARACT EXTRACTION W/PHACO Right 03/02/2021   Procedure: CATARACT EXTRACTION PHACO AND INTRAOCULAR LENS PLACEMENT (IOC) RIGHT 4.23 00:31.5;  Surgeon: Jaye Fallow, MD;  Location: [facility];  Service: Ophthalmology;  Laterality: Right;  COVID + 01-10-21   CATARACT EXTRACTION W/PHACO Left 03/16/2021   Procedure: CATARACT EXTRACTION PHACO AND INTRAOCULAR LENS PLACEMENT (IOC) LEFT 7.06 00:40.7;  Surgeon: Jaye Fallow, MD;  Location: [facility];  Service: Ophthalmology;  Laterality: Left;   ORIF WRIST FRACTURE Left 09/05/2019   Procedure: OPEN REDUCTION INTERNAL FIXATION (ORIF) WRIST FRACTURE;  Surgeon: Margrette Taft BRAVO, MD;  Location: [facility];  Service: Orthopedics;  Laterality: Left;   Patient Active Problem List   Diagnosis Date Noted   Osteoporosis 01/06/2020   Fracture of left wrist with routine healing 10/04/2019   Fracture, [place], left, closed 09/09/2019   S/P ORIF (open reduction internal fixation) fracture 09/05/2019 left wrist  09/09/2019   Left wrist fracture    Hypertension 09/02/2019    PCP: Valora Agent, MD   REFERRING PROVIDER: Beverley Evalene BIRCH, MD  REFERRING DIAG: M54.2 (ICD-10-CM) - Cervicalgia  THERAPY DIAG:   Cervicalgia  Numbness and tingling of right arm  Chronic right shoulder pain  Rationale for Evaluation and Treatment: Rehabilitation  ONSET DATE: been going on for a while, 3-4 months ago it has gotten worse.  SUBJECTIVE:                                                                                                                                                                                                         SUBJECTIVE STATEMENT: Pt reports she had increased pain on Tuesday following her last appointment, up to  8/10.  No known cause; now decreased back down to 4/10. Reports R arm pain is a throbbing pain.    Eval: Pt states neck pain has been going on for a while now but has worsening in the last few months. States she went to doc for Cortizone shot but wanted her to do therapy first. Pt states pain gets so bad she can hardly lift arm (right). Pt as broken both wrists in the past and wonders if the nerve blocks on RUE could have something to do with the numbness sensation she has all the way down the arm to the hand almost all the time. Pt states she would like to decrease the pain and perform ADLs that require lifting overhead with less pain.     Hand dominance: Right  PERTINENT HISTORY:  Has broken both wrists  PAIN:  Are you having pain? Yes: NPRS scale: 4/10 Pain location: lower cervical region to upper trap and shoulder, all the way down to the hand, primarily medial 3 fingers Pain description: burning, shooting pain Aggravating factors: none reported Relieving factors: alieve, heat, extension, biofreeze   PRECAUTIONS: None  RED FLAGS: None     WEIGHT BEARING RESTRICTIONS: No  FALLS:  Has patient fallen in last 6 months? 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  NEXT MD VISIT: end of  September  OBJECTIVE:  Note: Objective measures were completed at Evaluation unless otherwise noted.  DIAGNOSTIC FINDINGS:  None  PATIENT SURVEYS:  NDI: 7/50  COGNITION: Overall cognitive status: Within functional limits for tasks assessed  SENSATION: Light touch: Impaired , right arm consistently numb to the hand  POSTURE: rounded shoulders and forward head  PALPATION: Tenderness to palpation to paraspinals of cervical spine especially of right side and upper trap region.   CERVICAL ROM:   Active ROM A/PROM (deg) eval  Flexion 55, slight pain  Extension 35, pain worse than flexion  Right lateral flexion 25, increased pain  Left lateral flexion 40, no pain  Right rotation 64, worse to the right  Left rotation 46, pain   (Blank rows = not tested)  UPPER EXTREMITY ROM:  Active ROM Right eval Left eval  Shoulder flexion    Shoulder extension    Shoulder abduction    Shoulder adduction Bad pain   Shoulder extension    Shoulder internal rotation    Shoulder external rotation    Elbow flexion    Elbow extension    Wrist flexion    Wrist extension    Wrist ulnar deviation    Wrist radial deviation    Wrist pronation    Wrist supination     (Blank rows = not tested)  UPPER EXTREMITY MMT:  MMT Right eval Left eval  Shoulder flexion 4 4  Shoulder extension    Shoulder abduction 4-, pain 4  Shoulder adduction    Shoulder extension 4 4  Shoulder internal rotation 4, slight pain 4  Shoulder external rotation 4, slight pain 4  Middle trapezius    Lower trapezius    Elbow flexion 4+ 4+  Elbow extension 4+ 4+  Wrist flexion    Wrist extension    Wrist ulnar deviation    Wrist radial deviation    Wrist pronation    Wrist supination    Grip strength WNL WNL   (Blank rows = not tested)  CERVICAL SPECIAL TESTS:  Spurling's test: Positive and Distraction test: Positive Right side   TREATMENT DATE:  08/05/24: UBE bike, 2 minutes forward, 2 minute bwd,  pt reports increased pain in R shoulder, requiring breaks, level 2 Standing:  Shoulder Rows, RTB, 2x10 Shoulder Extension, RTB, 2x10 D1 RTB 2X10 each Seated:  Posterior shoulder rolls 15X Upper Trap stretch, 30 holds each direction Levator Scap stretch, 30 each direction Manual: supine cervical traction, sub occipital release, STM bil, Rt>Lt traps, paraspinals  08/05/24: UBE bike, 2 minutes forward, 2 minute bwd, pt reports increased pain in R shoulder, requiring breaks, levels 1-2 Shoulder pendulum, RUE, 2' Shoulder Rows, RTB, 2x10 Shoulder Extension, RTB, 2x10 Posterior shoulder rolls, 2' Ts and Xs, 15 x of each with RTB STM of R Cervical Paraspinal musculature Upper Trap stretch, 30 holds Levator Scap stretch, 30   07/31/2024  Manual Therapy: -STM of Cervical Paraspinal musculature Therapeutic Exercise: -UBE bike, 1 minute 47 seconds forward, 1 minute bwd, pt reports increased pain in rihgt neck and shoulder -Standing shoulder rows, RTB at chest level, 2 sets of 10 reps, pt cued for LE positing and scapular retraction -Standing shoulder extensions, RTB at chest level, 2 sets of 10 reps, pt cued for LE positing and scapular retraction -Seated Lat pull downs plate 3, pt cued for eccentric control, 2 sets of 10 reps -Standing D2 flexion, 2 sets of 10 reps, pt cued for eccentric control, RTB on lowest web slide setting -UT stretch seated on mat table, 2 sets of 30 second holds, pt requires verbal and tactile cues for proper form -Levator stretch seated on mat table, 2 sets of 30 second holds, pt requires verbal and tactile cues for proper form Neuromuscular Re-education: -Cat cow stretch with cervical ROM, 1 set of 8 reps, pt requires verbal and tactile cues for proper sequencing -Thread the needle with cervical ROM, pt cued for increased ROM, 1 set of 10 reps   07/29/2024  Evaluation: -ROM measured, Strength assessed, HEP prescribed, pt educated on prognosis, findings, and  importance of HEP compliance if given.   PATIENT EDUCATION:  Education details: Pt was educated on findings of PT evaluation, prognosis, frequency of therapy visits and rationale, attendance policy, and HEP if given.   Person educated: Patient Education method: Explanation, Verbal cues, and Handouts Education comprehension: verbalized understanding, verbal cues required, and needs further education  HOME EXERCISE PROGRAM: Access Code: [email or web address] URL: [email or web address] Date: 07/29/2024 Prepared by: Lang Ada  Exercises - Seated Scapular Retraction  - 1 x daily - 7 x weekly - 3 sets - 10 reps - 3 hold - Seated Cervical Retraction  - 1 x daily - 7 x weekly - 3 sets - 10 reps - 3 hold - Seated Upper Trapezius Stretch  - 1 x daily - 7 x weekly - 1 sets - 3 reps - 20 hold    Exercises - Standing Shoulder Horizontal Abduction with Resistance  - 2 x daily - 7 x weekly - 3 sets - 10 reps - Standing Shoulder Diagonal Horizontal Abduction 60/120 Degrees with Resistance  - 2 x daily - 7 x weekly - 3 sets - 10 reps - Seated Upper Trapezius Stretch  - 2 x daily - 7 x weekly - 3 sets - 30 hold    ASSESSMENT:  CLINICAL IMPRESSION: Began on UBE to warm up musculature.  Intermittent rest breaks every 30-45 seconds due to increased pain and fatigue in shoulders.  Pt requiring frequent verbal cueing throughout. Pt  reported good results from traction completed at one session so resumed that today as well as suboccipital release and massage.  Pt with most tightness/spasm Rt UT which was nearly fully resolved with manual.  Pt reported much improvement at end of session today.  Patient will continue to benefit from skilled physical therapy for increased endurance with cervical and UE mobility, decreased neck and right shoulder pain for improved quality of life   Eval: Patient is a 73 y.o. female who was seen today for physical therapy evaluation and treatment for M54.2 (ICD-10-CM) -  Cervicalgia. Patient demonstrates decreased cervical spine ROM, increased pain in neck, headaches, and increased tension in bilateral upper trapezius and paraspinals of cervical spine. Patient also suffering from increased frequency and intensity of headaches 2-3 times per week. Patient also demonstrates tenderness to C3-C7 vertebrae CPAs and UPAs especially on the R side of spine. Patient requires moderate verbal cuing for relaxation of cervical spine musculature with difficulty during HEP instruction. Patient would benefit from skilled physical therapy for decreased headache frequency and intensity, increased pain free ROM of cervical spine, increased strength in paraspinal and other postural musculature, and improved posture for improved ability to perform ADLs without symptom reproduction, return to higher level of function with ADLs, and progress towards therapy goals.   OBJECTIVE IMPAIRMENTS: decreased activity tolerance, decreased endurance, decreased mobility, decreased ROM, decreased strength, impaired sensation, and pain.   ACTIVITY LIMITATIONS: carrying, lifting, and reach over head  PARTICIPATION LIMITATIONS: meal prep, cleaning, laundry, shopping, community activity, and yard work  PERSONAL FACTORS: Age, Fitness, and Time since onset of injury/illness/exacerbation are also affecting patient's functional outcome.   REHAB POTENTIAL: Fair Chronic in nature  CLINICAL DECISION MAKING: Stable/uncomplicated  EVALUATION COMPLEXITY: Low   GOALS: Goals reviewed with patient? No  SHORT TERM GOALS: Target date: 08/19/24  Pt will be independent with HEP in order to demonstrate participation in Physical Therapy POC.  Baseline: Goal status: INITIAL  2.  Pt will report 2/10 pain with cervical mobility in order to demonstrate improved pain with ADLs.  Baseline:  Goal status: INITIAL  LONG TERM GOALS: Target date: 09/09/24  Pt will report decreased cervicogenic caused headaches to less  than 2 per week in order to demonstrate improved quality of life.  Baseline: 2-3x per week.  Goal status: INITIAL  2.  Pt will improve cervical ROM (flex/ext/lateral flexion/rotation) by 20 degrees combined in order to demonstrate improved functional ambulatory capacity in community setting.  Baseline: see objective.  Goal status: INITIAL  3.  Pt will improve NDI score to 0 in order to demonstrate decreased pain with functional goals and outcomes. Baseline: see objective.  Goal status: INITIAL  4.  Pt will report 1/10 pain with cervical mobility in order to demonstrate reduced pain with ADLs that require use of cervical spine musculature (driving, washing hair, reaching to elevated cabinet).  Baseline: see objective.  Goal status: INITIAL     PLAN:  PT FREQUENCY: 1-2x/week  PT DURATION: 6 weeks  PLANNED INTERVENTIONS: 97110-Therapeutic exercises, 97530- Therapeutic activity, 97112- Neuromuscular re-education, 97535- Self Care, [id number]- Manual therapy, Patient/Family education, Spinal mobilization, Cryotherapy, and Moist heat  PLAN FOR NEXT SESSION: continue manual traction, and STM of right upper trap, progress cervical and scapular mobility, progress stretching of right upper extremity, neck, and quadrant, formal assessment of trapezius musculature   11:51 AM, 08/08/24 Greig KATHEE Fuse, PTA/CLT [facility] Ph: [phone]

## 2024-08-13 ENCOUNTER — Encounter (HOSPITAL_COMMUNITY): Payer: Self-pay

## 2024-08-13 ENCOUNTER — Ambulatory Visit (HOSPITAL_COMMUNITY)

## 2024-08-13 ENCOUNTER — Encounter (HOSPITAL_COMMUNITY)

## 2024-08-13 DIAGNOSIS — R202 Paresthesia of skin: Secondary | ICD-10-CM

## 2024-08-13 DIAGNOSIS — M542 Cervicalgia: Secondary | ICD-10-CM | POA: Diagnosis not present

## 2024-08-13 DIAGNOSIS — G8929 Other chronic pain: Secondary | ICD-10-CM

## 2024-08-13 NOTE — Therapy (Signed)
 OUTPATIENT PHYSICAL THERAPY CERVICAL TREATMENT   Patient Name: Victoria Dominguez MRN: [id number] DOB:Nov 15, 1951, 73 y.o., female Today's Date: 08/13/2024  END OF SESSION:  PT End of Session - 08/13/24 1546     Visit Number 5    Number of Visits 8    Date for Recertification  09/02/24    Authorization Type MEDICARE PART A AND B    Authorization Time Period no auth    Progress Note Due on Visit 8    PT Start Time 1547    PT Stop Time 1626    PT Time Calculation (min) 39 min    Activity Tolerance Patient tolerated treatment well    Behavior During Therapy WFL for tasks assessed/performed           Past Medical History:  Diagnosis Date   Hypertension    Past Surgical History:  Procedure Laterality Date   ABDOMINAL HYSTERECTOMY     CATARACT EXTRACTION W/PHACO Right 03/02/2021   Procedure: CATARACT EXTRACTION PHACO AND INTRAOCULAR LENS PLACEMENT (IOC) RIGHT 4.23 00:31.5;  Surgeon: Jaye Fallow, MD;  Location: [facility];  Service: Ophthalmology;  Laterality: Right;  COVID + 01-10-21   CATARACT EXTRACTION W/PHACO Left 03/16/2021   Procedure: CATARACT EXTRACTION PHACO AND INTRAOCULAR LENS PLACEMENT (IOC) LEFT 7.06 00:40.7;  Surgeon: Jaye Fallow, MD;  Location: [facility];  Service: Ophthalmology;  Laterality: Left;   ORIF WRIST FRACTURE Left 09/05/2019   Procedure: OPEN REDUCTION INTERNAL FIXATION (ORIF) WRIST FRACTURE;  Surgeon: Margrette Taft BRAVO, MD;  Location: [facility];  Service: Orthopedics;  Laterality: Left;   Patient Active Problem List   Diagnosis Date Noted   Osteoporosis 01/06/2020   Fracture of left wrist with routine healing 10/04/2019   Fracture, [place], left, closed 09/09/2019   S/P ORIF (open reduction internal fixation) fracture 09/05/2019 left wrist  09/09/2019   Left wrist fracture    Hypertension 09/02/2019    PCP: Valora Agent, MD   REFERRING PROVIDER: Beverley Evalene BIRCH, MD  REFERRING DIAG: M54.2 (ICD-10-CM) -  Cervicalgia  THERAPY DIAG:  Cervicalgia  Numbness and tingling of right arm  Chronic right shoulder pain  Rationale for Evaluation and Treatment: Rehabilitation  ONSET DATE: been going on for a while, 3-4 months ago it has gotten worse.  SUBJECTIVE:                                                                                                                                                                                                         SUBJECTIVE STATEMENT: Pain scale 6/10 Rt shoulder and Rt  side neck pain, Rt arm is tingling to all fingers, not thumb.  Eval: Pt states neck pain has been going on for a while now but has worsening in the last few months. States she went to doc for Cortizone shot but wanted her to do therapy first. Pt states pain gets so bad she can hardly lift arm (right). Pt as broken both wrists in the past and wonders if the nerve blocks on RUE could have something to do with the numbness sensation she has all the way down the arm to the hand almost all the time. Pt states she would like to decrease the pain and perform ADLs that require lifting overhead with less pain.     Hand dominance: Right  PERTINENT HISTORY:  Has broken both wrists  PAIN:  Are you having pain? Yes: NPRS scale: 4/10 Pain location: lower cervical region to upper trap and shoulder, all the way down to the hand, primarily medial 3 fingers Pain description: burning, shooting pain Aggravating factors: none reported Relieving factors: alieve, heat, extension, biofreeze   PRECAUTIONS: None  RED FLAGS: None     WEIGHT BEARING RESTRICTIONS: No  FALLS:  Has patient fallen in last 6 months? 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  NEXT MD VISIT: 08/19/24  OBJECTIVE:  Note: Objective measures were completed at  Evaluation unless otherwise noted.  DIAGNOSTIC FINDINGS:  None  PATIENT SURVEYS:  NDI: 7/50  COGNITION: Overall cognitive status: Within functional limits for tasks assessed  SENSATION: Light touch: Impaired , right arm consistently numb to the hand  POSTURE: rounded shoulders and forward head  PALPATION: Tenderness to palpation to paraspinals of cervical spine especially of right side and upper trap region.   CERVICAL ROM:   Active ROM A/PROM (deg) eval  Flexion 55, slight pain  Extension 35, pain worse than flexion  Right lateral flexion 25, increased pain  Left lateral flexion 40, no pain  Right rotation 64, worse to the right  Left rotation 46, pain   (Blank rows = not tested)  UPPER EXTREMITY ROM:  Active ROM Right eval Left eval  Shoulder flexion    Shoulder extension    Shoulder abduction    Shoulder adduction Bad pain   Shoulder extension    Shoulder internal rotation    Shoulder external rotation    Elbow flexion    Elbow extension    Wrist flexion    Wrist extension    Wrist ulnar deviation    Wrist radial deviation    Wrist pronation    Wrist supination     (Blank rows = not tested)  UPPER EXTREMITY MMT:  MMT Right eval Left eval  Shoulder flexion 4 4  Shoulder extension    Shoulder abduction 4-, pain 4  Shoulder adduction    Shoulder extension 4 4  Shoulder internal rotation 4, slight pain 4  Shoulder external rotation 4, slight pain 4  Middle trapezius    Lower trapezius    Elbow flexion 4+ 4+  Elbow extension 4+ 4+  Wrist flexion    Wrist extension    Wrist ulnar deviation    Wrist radial deviation    Wrist pronation    Wrist supination    Grip strength WNL WNL   (Blank rows = not tested)  CERVICAL SPECIAL TESTS:  Spurling's test: Positive and Distraction test:  Positive Right side   TREATMENT DATE:  08/13/24: UBE bike, 2 minutes forward, 2 minute bwd, pt reports increased pain in R shoulder, no re Seated:  3D thoracic  excursion 5x Manual: supine cervical traction, sub occipital release, STM bil, Rt>Lt traps, paraspinals Seated:   Wback then elbows down (lower trap activation) reports of decreased tingling Upper Trap stretch, 30 holds each direction Levator Scap stretch, 30 each direction  08/05/24: UBE bike, 2 minutes forward, 2 minute bwd, pt reports increased pain in R shoulder, requiring breaks, level 2 Standing:  Shoulder Rows, RTB, 2x10 Shoulder Extension, RTB, 2x10 D1 RTB 2X10 each Seated:  Posterior shoulder rolls 15X Upper Trap stretch, 30 holds each direction Levator Scap stretch, 30 each direction Manual: supine cervical traction, sub occipital release, STM bil, Rt>Lt traps, paraspinals  08/05/24: UBE bike, 2 minutes forward, 2 minute bwd, pt reports increased pain in R shoulder, requiring breaks, levels 1-2 Shoulder pendulum, RUE, 2' Shoulder Rows, RTB, 2x10 Shoulder Extension, RTB, 2x10 Posterior shoulder rolls, 2' Ts and Xs, 15 x of each with RTB STM of R Cervical Paraspinal musculature Upper Trap stretch, 30 holds Levator Scap stretch, 30   07/31/2024  Manual Therapy: -STM of Cervical Paraspinal musculature Therapeutic Exercise: -UBE bike, 1 minute 47 seconds forward, 1 minute bwd, pt reports increased pain in rihgt neck and shoulder -Standing shoulder rows, RTB at chest level, 2 sets of 10 reps, pt cued for LE positing and scapular retraction -Standing shoulder extensions, RTB at chest level, 2 sets of 10 reps, pt cued for LE positing and scapular retraction -Seated Lat pull downs plate 3, pt cued for eccentric control, 2 sets of 10 reps -Standing D2 flexion, 2 sets of 10 reps, pt cued for eccentric control, RTB on lowest web slide setting -UT stretch seated on mat table, 2 sets of 30 second holds, pt requires verbal and tactile cues for proper form -Levator stretch seated on mat table, 2 sets of 30 second holds, pt requires verbal and tactile cues for proper  form Neuromuscular Re-education: -Cat cow stretch with cervical ROM, 1 set of 8 reps, pt requires verbal and tactile cues for proper sequencing -Thread the needle with cervical ROM, pt cued for increased ROM, 1 set of 10 reps   07/29/2024  Evaluation: -ROM measured, Strength assessed, HEP prescribed, pt educated on prognosis, findings, and importance of HEP compliance if given.   PATIENT EDUCATION:  Education details: Pt was educated on findings of PT evaluation, prognosis, frequency of therapy visits and rationale, attendance policy, and HEP if given.   Person educated: Patient Education method: Explanation, Verbal cues, and Handouts Education comprehension: verbalized understanding, verbal cues required, and needs further education  HOME EXERCISE PROGRAM: Access Code: [email or web address] URL: [email or web address] Date: 07/29/2024 Prepared by: Lang Ada  Exercises - Seated Scapular Retraction  - 1 x daily - 7 x weekly - 3 sets - 10 reps - 3 hold - Seated Cervical Retraction  - 1 x daily - 7 x weekly - 3 sets - 10 reps - 3 hold - Seated Upper Trapezius Stretch  - 1 x daily - 7 x weekly - 1 sets - 3 reps - 20 hold    Exercises - Standing Shoulder Horizontal Abduction with Resistance  - 2 x daily - 7 x weekly - 3 sets - 10 reps - Standing Shoulder Diagonal Horizontal Abduction 60/120 Degrees with Resistance  - 2 x daily - 7 x weekly - 3 sets - 10 reps - Seated Upper  Trapezius Stretch  - 2 x daily - 7 x weekly - 3 sets - 30 hold  08/13/24: - 3D thoracic excursion - Wback    ASSESSMENT:  CLINICAL IMPRESSION: Added 3D thoracic excursion and stretches for mobility.  Began on UBE for dynamic warm up, reports of low grade Rt shoulder pain though did not require rest breaks this session. Manual STM, traction and subocciptial release complete to address moderate tightness from Rt UT.  Added Wback to initiate lower trap activation to relax UT, pt stated decreased tingling in Rt  fingers following this exercise.  Eval: Patient is a 73 y.o. female who was seen today for physical therapy evaluation and treatment for M54.2 (ICD-10-CM) - Cervicalgia. Patient demonstrates decreased cervical spine ROM, increased pain in neck, headaches, and increased tension in bilateral upper trapezius and paraspinals of cervical spine. Patient also suffering from increased frequency and intensity of headaches 2-3 times per week. Patient also demonstrates tenderness to C3-C7 vertebrae CPAs and UPAs especially on the R side of spine. Patient requires moderate verbal cuing for relaxation of cervical spine musculature with difficulty during HEP instruction. Patient would benefit from skilled physical therapy for decreased headache frequency and intensity, increased pain free ROM of cervical spine, increased strength in paraspinal and other postural musculature, and improved posture for improved ability to perform ADLs without symptom reproduction, return to higher level of function with ADLs, and progress towards therapy goals.   OBJECTIVE IMPAIRMENTS: decreased activity tolerance, decreased endurance, decreased mobility, decreased ROM, decreased strength, impaired sensation, and pain.   ACTIVITY LIMITATIONS: carrying, lifting, and reach over head  PARTICIPATION LIMITATIONS: meal prep, cleaning, laundry, shopping, community activity, and yard work  PERSONAL FACTORS: Age, Fitness, and Time since onset of injury/illness/exacerbation are also affecting patient's functional outcome.   REHAB POTENTIAL: Fair Chronic in nature  CLINICAL DECISION MAKING: Stable/uncomplicated  EVALUATION COMPLEXITY: Low   GOALS: Goals reviewed with patient? No  SHORT TERM GOALS: Target date: 08/19/24  Pt will be independent with HEP in order to demonstrate participation in Physical Therapy POC.  Baseline: Goal status: INITIAL  2.  Pt will report 2/10 pain with cervical mobility in order to demonstrate improved  pain with ADLs.  Baseline:  Goal status: INITIAL  LONG TERM GOALS: Target date: 09/09/24  Pt will report decreased cervicogenic caused headaches to less than 2 per week in order to demonstrate improved quality of life.  Baseline: 2-3x per week.  Goal status: INITIAL  2.  Pt will improve cervical ROM (flex/ext/lateral flexion/rotation) by 20 degrees combined in order to demonstrate improved functional ambulatory capacity in community setting.  Baseline: see objective.  Goal status: INITIAL  3.  Pt will improve NDI score to 0 in order to demonstrate decreased pain with functional goals and outcomes. Baseline: see objective.  Goal status: INITIAL  4.  Pt will report 1/10 pain with cervical mobility in order to demonstrate reduced pain with ADLs that require use of cervical spine musculature (driving, washing hair, reaching to elevated cabinet).  Baseline: see objective.  Goal status: INITIAL     PLAN:  PT FREQUENCY: 1-2x/week  PT DURATION: 6 weeks  PLANNED INTERVENTIONS: 97110-Therapeutic exercises, 97530- Therapeutic activity, 97112- Neuromuscular re-education, 97535- Self Care, [id number]- Manual therapy, Patient/Family education, Spinal mobilization, Cryotherapy, and Moist heat  PLAN FOR NEXT SESSION: continue manual traction, and STM of right upper trap, progress cervical and scapular mobility, progress stretching of right upper extremity, neck, and quadrant, formal assessment of trapezius musculature  Augustin Mclean, LPTA/CLT; [facility]  [phone]  4:40 PM, 08/13/24

## 2024-08-15 ENCOUNTER — Ambulatory Visit (HOSPITAL_COMMUNITY)

## 2024-08-15 ENCOUNTER — Encounter (HOSPITAL_COMMUNITY): Payer: Self-pay

## 2024-08-15 DIAGNOSIS — R200 Anesthesia of skin: Secondary | ICD-10-CM

## 2024-08-15 DIAGNOSIS — M542 Cervicalgia: Secondary | ICD-10-CM

## 2024-08-15 DIAGNOSIS — G8929 Other chronic pain: Secondary | ICD-10-CM

## 2024-08-15 NOTE — Therapy (Signed)
 OUTPATIENT PHYSICAL THERAPY CERVICAL TREATMENT   Patient Name: Emalina OCEANA WALTHALL MRN: [id number] DOB:Nov 23, 1950, 73 y.o., female Today's Date: 08/15/2024  END OF SESSION:  PT End of Session - 08/15/24 0815     Visit Number 6    Number of Visits 8    Date for Recertification  09/02/24    Authorization Type MEDICARE PART A AND B    Authorization Time Period no auth    Progress Note Due on Visit 8    PT Start Time 0815    PT Stop Time 0858    PT Time Calculation (min) 43 min    Activity Tolerance Patient tolerated treatment well    Behavior During Therapy WFL for tasks assessed/performed           Past Medical History:  Diagnosis Date   Hypertension    Past Surgical History:  Procedure Laterality Date   ABDOMINAL HYSTERECTOMY     CATARACT EXTRACTION W/PHACO Right 03/02/2021   Procedure: CATARACT EXTRACTION PHACO AND INTRAOCULAR LENS PLACEMENT (IOC) RIGHT 4.23 00:31.5;  Surgeon: Jaye Fallow, MD;  Location: [facility];  Service: Ophthalmology;  Laterality: Right;  COVID + 01-10-21   CATARACT EXTRACTION W/PHACO Left 03/16/2021   Procedure: CATARACT EXTRACTION PHACO AND INTRAOCULAR LENS PLACEMENT (IOC) LEFT 7.06 00:40.7;  Surgeon: Jaye Fallow, MD;  Location: [facility];  Service: Ophthalmology;  Laterality: Left;   ORIF WRIST FRACTURE Left 09/05/2019   Procedure: OPEN REDUCTION INTERNAL FIXATION (ORIF) WRIST FRACTURE;  Surgeon: Margrette Taft BRAVO, MD;  Location: [facility];  Service: Orthopedics;  Laterality: Left;   Patient Active Problem List   Diagnosis Date Noted   Osteoporosis 01/06/2020   Fracture of left wrist with routine healing 10/04/2019   Fracture, [place], left, closed 09/09/2019   S/P ORIF (open reduction internal fixation) fracture 09/05/2019 left wrist  09/09/2019   Left wrist fracture    Hypertension 09/02/2019    PCP: Valora Agent, MD   REFERRING PROVIDER: Beverley Evalene BIRCH, MD  REFERRING DIAG: M54.2 (ICD-10-CM) -  Cervicalgia  THERAPY DIAG:  Cervicalgia  Numbness and tingling of right arm  Chronic right shoulder pain  Rationale for Evaluation and Treatment: Rehabilitation  ONSET DATE: been going on for a while, 3-4 months ago it has gotten worse.  SUBJECTIVE:                                                                                                                                                                                                         SUBJECTIVE STATEMENT: Reports she was sore yesterday, took an  Aleve to assist with pain.  No real pain today, does have some tenderness on Rt neck/shoulder and continued radicular symptoms to all fingers, not thumb.  Eval: Pt states neck pain has been going on for a while now but has worsening in the last few months. States she went to doc for Cortizone shot but wanted her to do therapy first. Pt states pain gets so bad she can hardly lift arm (right). Pt as broken both wrists in the past and wonders if the nerve blocks on RUE could have something to do with the numbness sensation she has all the way down the arm to the hand almost all the time. Pt states she would like to decrease the pain and perform ADLs that require lifting overhead with less pain.     Hand dominance: Right  PERTINENT HISTORY:  Has broken both wrists  PAIN:  Are you having pain? Yes: NPRS scale: 4/10 Pain location: lower cervical region to upper trap and shoulder, all the way down to the hand, primarily medial 3 fingers Pain description: burning, shooting pain Aggravating factors: none reported Relieving factors: alieve, heat, extension, biofreeze   PRECAUTIONS: None  RED FLAGS: None     WEIGHT BEARING RESTRICTIONS: No  FALLS:  Has patient fallen in last 6 months? No  OCCUPATION: Retired, office work in [place]   PLOF: Independent and Independent with basic ADLs  PATIENT GOALS: decrease the pain and numbness in right arm, overhead activity performance  improvement.  NEXT MD VISIT: 08/19/24  OBJECTIVE:  Note: Objective measures were completed at Evaluation unless otherwise noted.  DIAGNOSTIC FINDINGS:  None  PATIENT SURVEYS:  NDI: 7/50  COGNITION: Overall cognitive status: Within functional limits for tasks assessed  SENSATION: Light touch: Impaired , right arm consistently numb to the hand  POSTURE: rounded shoulders and forward head  PALPATION: Tenderness to palpation to paraspinals of cervical spine especially of right side and upper trap region.   CERVICAL ROM:   Active ROM A/PROM (deg) eval  Flexion 55, slight pain  Extension 35, pain worse than flexion  Right lateral flexion 25, increased pain  Left lateral flexion 40, no pain  Right rotation 64, worse to the right  Left rotation 46, pain   (Blank rows = not tested)  UPPER EXTREMITY ROM:  Active ROM Right eval Left eval  Shoulder flexion    Shoulder extension    Shoulder abduction    Shoulder adduction Bad pain   Shoulder extension    Shoulder internal rotation    Shoulder external rotation    Elbow flexion    Elbow extension    Wrist flexion    Wrist extension    Wrist ulnar deviation    Wrist radial deviation    Wrist pronation    Wrist supination     (Blank rows = not tested)  UPPER EXTREMITY MMT:  MMT Right eval Left eval  Shoulder flexion 4 4  Shoulder extension    Shoulder abduction 4-, pain 4  Shoulder adduction    Shoulder extension 4 4  Shoulder internal rotation 4, slight pain 4  Shoulder external rotation 4, slight pain 4  Middle trapezius    Lower trapezius    Elbow flexion 4+ 4+  Elbow extension 4+ 4+  Wrist flexion    Wrist extension    Wrist ulnar deviation    Wrist radial deviation    Wrist pronation    Wrist supination    Grip strength WNL WNL   (Blank rows =  not tested)  CERVICAL SPECIAL TESTS:  Spurling's test: Positive and Distraction test: Positive Right side   TREATMENT DATE:  08/15/24: UBE bike, 2  minutes forward, 2 minute bwd, pt reports increased pain in R shoulder, no rest break required Standing:  RTB ER 2x 10 5 (cueing to reduce wrist extension) Seated:  [place] Lower Trap pull down 3Pl 2x 10  Radial nerve glide 3x 10 Prone:  Straight arm raise with 1# 10x   Rows 10x Manual: supine cervical traction, sub occipital release, STM bil, Rt>Lt traps, paraspinals  08/13/24: UBE bike, 2 minutes forward, 2 minute bwd, pt reports increased pain in R shoulder, no rest break required Seated:  3D thoracic excursion 5x Manual: supine cervical traction, sub occipital release, STM bil, Rt>Lt traps, paraspinals Seated:   Wback then elbows down (lower trap activation) reports of decreased tingling Upper Trap stretch, 30 holds each direction Levator Scap stretch, 30 each direction  08/05/24: UBE bike, 2 minutes forward, 2 minute bwd, pt reports increased pain in R shoulder, requiring breaks, level 2 Standing:  Shoulder Rows, RTB, 2x10 Shoulder Extension, RTB, 2x10 D1 RTB 2X10 each Seated:  Posterior shoulder rolls 15X Upper Trap stretch, 30 holds each direction Levator Scap stretch, 30 each direction Manual: supine cervical traction, sub occipital release, STM bil, Rt>Lt traps, paraspinals  08/05/24: UBE bike, 2 minutes forward, 2 minute bwd, pt reports increased pain in R shoulder, requiring breaks, levels 1-2 Shoulder pendulum, RUE, 2' Shoulder Rows, RTB, 2x10 Shoulder Extension, RTB, 2x10 Posterior shoulder rolls, 2' Ts and Xs, 15 x of each with RTB STM of R Cervical Paraspinal musculature Upper Trap stretch, 30 holds Levator Scap stretch, 30   07/31/2024  Manual Therapy: -STM of Cervical Paraspinal musculature Therapeutic Exercise: -UBE bike, 1 minute 47 seconds forward, 1 minute bwd, pt reports increased pain in rihgt neck and shoulder -Standing shoulder rows, RTB at chest level, 2 sets of 10 reps, pt cued for LE positing and scapular retraction -Standing  shoulder extensions, RTB at chest level, 2 sets of 10 reps, pt cued for LE positing and scapular retraction -Seated Lat pull downs plate 3, pt cued for eccentric control, 2 sets of 10 reps -Standing D2 flexion, 2 sets of 10 reps, pt cued for eccentric control, RTB on lowest web slide setting -UT stretch seated on mat table, 2 sets of 30 second holds, pt requires verbal and tactile cues for proper form -Levator stretch seated on mat table, 2 sets of 30 second holds, pt requires verbal and tactile cues for proper form Neuromuscular Re-education: -Cat cow stretch with cervical ROM, 1 set of 8 reps, pt requires verbal and tactile cues for proper sequencing -Thread the needle with cervical ROM, pt cued for increased ROM, 1 set of 10 reps   07/29/2024  Evaluation: -ROM measured, Strength assessed, HEP prescribed, pt educated on prognosis, findings, and importance of HEP compliance if given.   PATIENT EDUCATION:  Education details: Pt was educated on findings of PT evaluation, prognosis, frequency of therapy visits and rationale, attendance policy, and HEP if given.   Person educated: Patient Education method: Explanation, Verbal cues, and Handouts Education comprehension: verbalized understanding, verbal cues required, and needs further education  HOME EXERCISE PROGRAM: Access Code: [email or web address] URL: [email or web address] Date: 07/29/2024 Prepared by: Lang Ada  Exercises - Seated Scapular Retraction  - 1 x daily - 7 x weekly - 3 sets - 10 reps - 3 hold - Seated Cervical Retraction  - 1 x daily - 7  x weekly - 3 sets - 10 reps - 3 hold - Seated Upper Trapezius Stretch  - 1 x daily - 7 x weekly - 1 sets - 3 reps - 20 hold    Exercises - Standing Shoulder Horizontal Abduction with Resistance  - 2 x daily - 7 x weekly - 3 sets - 10 reps - Standing Shoulder Diagonal Horizontal Abduction 60/120 Degrees with Resistance  - 2 x daily - 7 x weekly - 3 sets - 10 reps - Seated Upper  Trapezius Stretch  - 2 x daily - 7 x weekly - 3 sets - 30 hold  08/13/24: - 3D thoracic excursion - Wback  08/15/24:  radial nerve glide    ASSESSMENT:  CLINICAL IMPRESSION: Session focus with postural strengthening and cervical mobility.  Added lower trap pull down and prone postural strengthening that was tolerated well.  Added nerve glides with some difficulty with form due to wrist mobility, reports of decreased tingling during radial nerve glides.  Manual STM, traction and suboccipital release complete to address moderate tightness of cervical mm.  Eval: Patient is a 73 y.o. female who was seen today for physical therapy evaluation and treatment for M54.2 (ICD-10-CM) - Cervicalgia. Patient demonstrates decreased cervical spine ROM, increased pain in neck, headaches, and increased tension in bilateral upper trapezius and paraspinals of cervical spine. Patient also suffering from increased frequency and intensity of headaches 2-3 times per week. Patient also demonstrates tenderness to C3-C7 vertebrae CPAs and UPAs especially on the R side of spine. Patient requires moderate verbal cuing for relaxation of cervical spine musculature with difficulty during HEP instruction. Patient would benefit from skilled physical therapy for decreased headache frequency and intensity, increased pain free ROM of cervical spine, increased strength in paraspinal and other postural musculature, and improved posture for improved ability to perform ADLs without symptom reproduction, return to higher level of function with ADLs, and progress towards therapy goals.   OBJECTIVE IMPAIRMENTS: decreased activity tolerance, decreased endurance, decreased mobility, decreased ROM, decreased strength, impaired sensation, and pain.   ACTIVITY LIMITATIONS: carrying, lifting, and reach over head  PARTICIPATION LIMITATIONS: meal prep, cleaning, laundry, shopping, community activity, and yard work  PERSONAL FACTORS: Age,  Fitness, and Time since onset of injury/illness/exacerbation are also affecting patient's functional outcome.   REHAB POTENTIAL: Fair Chronic in nature  CLINICAL DECISION MAKING: Stable/uncomplicated  EVALUATION COMPLEXITY: Low   GOALS: Goals reviewed with patient? No  SHORT TERM GOALS: Target date: 08/19/24  Pt will be independent with HEP in order to demonstrate participation in Physical Therapy POC.  Baseline: Goal status: INITIAL  2.  Pt will report 2/10 pain with cervical mobility in order to demonstrate improved pain with ADLs.  Baseline:  Goal status: INITIAL  LONG TERM GOALS: Target date: 09/09/24  Pt will report decreased cervicogenic caused headaches to less than 2 per week in order to demonstrate improved quality of life.  Baseline: 2-3x per week.  Goal status: INITIAL  2.  Pt will improve cervical ROM (flex/ext/lateral flexion/rotation) by 20 degrees combined in order to demonstrate improved functional ambulatory capacity in community setting.  Baseline: see objective.  Goal status: INITIAL  3.  Pt will improve NDI score to 0 in order to demonstrate decreased pain with functional goals and outcomes. Baseline: see objective.  Goal status: INITIAL  4.  Pt will report 1/10 pain with cervical mobility in order to demonstrate reduced pain with ADLs that require use of cervical spine musculature (driving, washing hair, reaching to elevated  cabinet).  Baseline: see objective.  Goal status: INITIAL     PLAN:  PT FREQUENCY: 1-2x/week  PT DURATION: 6 weeks  PLANNED INTERVENTIONS: 97110-Therapeutic exercises, 97530- Therapeutic activity, [id number]- Neuromuscular re-education, 97535- Self Care, [id number]- Manual therapy, Patient/Family education, Spinal mobilization, Cryotherapy, and Moist heat  PLAN FOR NEXT SESSION: continue manual traction, and STM of right upper trap, progress cervical and scapular mobility, progress stretching of right upper extremity, neck, and  quadrant, formal assessment of trapezius musculature.  F/U with radial nerve glides.  Augustin Mclean, LPTA/CLT; CBIS [phone]  11:50 AM, 08/15/24

## 2024-08-20 ENCOUNTER — Encounter (HOSPITAL_COMMUNITY): Payer: Self-pay

## 2024-08-20 ENCOUNTER — Ambulatory Visit (HOSPITAL_COMMUNITY)

## 2024-08-20 DIAGNOSIS — M542 Cervicalgia: Secondary | ICD-10-CM | POA: Diagnosis not present

## 2024-08-20 DIAGNOSIS — R200 Anesthesia of skin: Secondary | ICD-10-CM

## 2024-08-20 DIAGNOSIS — G8929 Other chronic pain: Secondary | ICD-10-CM

## 2024-08-20 NOTE — Therapy (Signed)
 OUTPATIENT PHYSICAL THERAPY CERVICAL TREATMENT   Patient Name: Victoria Dominguez MRN: [id number] DOB:1951-03-16, 73 y.o., female Today's Date: 08/20/2024  END OF SESSION:  PT End of Session - 08/20/24 0814     Visit Number 7    Number of Visits 8    Date for Recertification  09/02/24    Authorization Type MEDICARE PART A AND B    Authorization Time Period no auth    Progress Note Due on Visit 8    PT Start Time 0814    PT Stop Time 0855    PT Time Calculation (min) 41 min    Activity Tolerance Patient tolerated treatment well    Behavior During Therapy [facility] for tasks assessed/performed            Past Medical History:  Diagnosis Date   Hypertension    Past Surgical History:  Procedure Laterality Date   ABDOMINAL HYSTERECTOMY     CATARACT EXTRACTION W/PHACO Right 03/02/2021   Procedure: CATARACT EXTRACTION PHACO AND INTRAOCULAR LENS PLACEMENT (IOC) RIGHT 4.23 00:31.5;  Surgeon: Jaye Fallow, MD;  Location: [facility];  Service: Ophthalmology;  Laterality: Right;  COVID + 01-10-21   CATARACT EXTRACTION W/PHACO Left 03/16/2021   Procedure: CATARACT EXTRACTION PHACO AND INTRAOCULAR LENS PLACEMENT (IOC) LEFT 7.06 00:40.7;  Surgeon: Jaye Fallow, MD;  Location: [facility];  Service: Ophthalmology;  Laterality: Left;   ORIF WRIST FRACTURE Left 09/05/2019   Procedure: OPEN REDUCTION INTERNAL FIXATION (ORIF) WRIST FRACTURE;  Surgeon: Margrette Taft BRAVO, MD;  Location: [facility];  Service: Orthopedics;  Laterality: Left;   Patient Active Problem List   Diagnosis Date Noted   Osteoporosis 01/06/2020   Fracture of left wrist with routine healing 10/04/2019   Fracture, [place], left, closed 09/09/2019   S/P ORIF (open reduction internal fixation) fracture 09/05/2019 left wrist  09/09/2019   Left wrist fracture    Hypertension 09/02/2019    PCP: Valora Agent, MD   REFERRING PROVIDER: Beverley Evalene BIRCH, MD  REFERRING DIAG: M54.2 (ICD-10-CM) -  Cervicalgia  THERAPY DIAG:  Cervicalgia  Numbness and tingling of right arm  Chronic right shoulder pain  Rationale for Evaluation and Treatment: Rehabilitation  ONSET DATE: been going on for a while, 3-4 months ago it has gotten worse.  SUBJECTIVE:                                                                                                                                                                                                         SUBJECTIVE STATEMENT: Pt states pain was really bad  yesterday, 8/10. Pt states she feels better this morning just a little sore. Pt states she is scheduled for MRI of the neck Monday. Pt states having some tingling in the R hand. Pt attributes increased pain to the weather.   Eval: Pt states neck pain has been going on for a while now but has worsening in the last few months. States she went to doc for Cortizone shot but wanted her to do therapy first. Pt states pain gets so bad she can hardly lift arm (right). Pt as broken both wrists in the past and wonders if the nerve blocks on RUE could have something to do with the numbness sensation she has all the way down the arm to the hand almost all the time. Pt states she would like to decrease the pain and perform ADLs that require lifting overhead with less pain.     Hand dominance: Right  PERTINENT HISTORY:  Has broken both wrists  PAIN:  Are you having pain? Yes: NPRS scale: 4/10 Pain location: lower cervical region to upper trap and shoulder, all the way down to the hand, primarily medial 3 fingers Pain description: burning, shooting pain Aggravating factors: none reported Relieving factors: alieve, heat, extension, biofreeze   PRECAUTIONS: None  RED FLAGS: None     WEIGHT BEARING RESTRICTIONS: No  FALLS:  Has patient fallen in last 6 months? No  OCCUPATION: Retired, office work in [place]   PLOF: Independent and Independent with basic ADLs  PATIENT GOALS: decrease the pain and  numbness in right arm, overhead activity performance improvement.  NEXT MD VISIT: 08/19/24  OBJECTIVE:  Note: Objective measures were completed at Evaluation unless otherwise noted.  DIAGNOSTIC FINDINGS:  None  PATIENT SURVEYS:  NDI: 7/50 08/20/24:  5 / 50 = 10.0 % COGNITION: Overall cognitive status: Within functional limits for tasks assessed  SENSATION: Light touch: Impaired , right arm consistently numb to the hand  POSTURE: rounded shoulders and forward head  PALPATION: Tenderness to palpation to paraspinals of cervical spine especially of right side and upper trap region.   CERVICAL ROM:   Active ROM A/PROM (deg) eval 08/20/24  Flexion 55, slight pain 60, tingling feeling between neck and shoulder  Extension 35, pain worse than flexion 45, tingling feeling between neck and shoulder  Right lateral flexion 25, increased pain 40, worse pain  Left lateral flexion 40, no pain 45, stretch  Right rotation 64, worse to the right 62, not much pain  Left rotation 46, pain 60, about the same as R   (Blank rows = not tested)  UPPER EXTREMITY ROM:  Active ROM Right eval Left eval  Shoulder flexion    Shoulder extension    Shoulder abduction    Shoulder adduction Bad pain   Shoulder extension    Shoulder internal rotation    Shoulder external rotation    Elbow flexion    Elbow extension    Wrist flexion    Wrist extension    Wrist ulnar deviation    Wrist radial deviation    Wrist pronation    Wrist supination     (Blank rows = not tested)  UPPER EXTREMITY MMT:  MMT Right eval Left eval  Shoulder flexion 4 4  Shoulder extension    Shoulder abduction 4-, pain 4  Shoulder adduction    Shoulder extension 4 4  Shoulder internal rotation 4, slight pain 4  Shoulder external rotation 4, slight pain 4  Middle trapezius    Lower trapezius  Elbow flexion 4+ 4+  Elbow extension 4+ 4+  Wrist flexion    Wrist extension    Wrist ulnar deviation    Wrist radial  deviation    Wrist pronation    Wrist supination    Grip strength WNL WNL   (Blank rows = not tested)  CERVICAL SPECIAL TESTS:  Spurling's test: Positive and Distraction test: Positive Right side   TREATMENT DATE:  08/20/2024  Progress note: NDI Manual Therapy: -STM of Cervical Paraspinal musculature -Manual traction, 5 reps, 10 seconds on, 5 seconds off -Cervical PROM 2 reps each direction Therapeutic Exercise: -UBE bike, 2 minutes forward, 2 minutes bwd, pt reports increased pain in rihgt neck and shoulder, level 1 resistance -Seated horizontal shoulder abductions and diagonals, 1 set of 5 reps each direction, increased in R shoulder symptoms -Seated bilateral shoulder ER, RTB, 1 set of 10 reps -UT stretch, Right only, seated on mat table, 1 set of 30 second holds, pt requires verbal and tactile cues for proper form -Levator stretch, Right only, seated on mat table, 1 set of 30 second holds, pt requires verbal and tactile cues for proper form   08/15/24: UBE bike, 2 minutes forward, 2 minute bwd, pt reports increased pain in R shoulder, no rest break required Standing:  RTB ER 2x 10 5 (cueing to reduce wrist extension) Seated:  [place] Lower Trap pull down 3Pl 2x 10  Radial nerve glide 3x 10 Prone:  Straight arm raise with 1# 10x   Rows 10x Manual: supine cervical traction, sub occipital release, STM bil, Rt>Lt traps, paraspinals  08/13/24: UBE bike, 2 minutes forward, 2 minute bwd, pt reports increased pain in R shoulder, no rest break required Seated:  3D thoracic excursion 5x Manual: supine cervical traction, sub occipital release, STM bil, Rt>Lt traps, paraspinals Seated:   Wback then elbows down (lower trap activation) reports of decreased tingling Upper Trap stretch, 30 holds each direction Levator Scap stretch, 30 each direction    PATIENT EDUCATION:  Education details: Pt was educated on findings of PT evaluation, prognosis, frequency of therapy  visits and rationale, attendance policy, and HEP if given.   Person educated: Patient Education method: Explanation, Verbal cues, and Handouts Education comprehension: verbalized understanding, verbal cues required, and needs further education  HOME EXERCISE PROGRAM: Access Code: [email or web address] URL: [email or web address] Date: 07/29/2024 Prepared by: Lang Ada  Exercises - Seated Scapular Retraction  - 1 x daily - 7 x weekly - 3 sets - 10 reps - 3 hold - Seated Cervical Retraction  - 1 x daily - 7 x weekly - 3 sets - 10 reps - 3 hold - Seated Upper Trapezius Stretch  - 1 x daily - 7 x weekly - 1 sets - 3 reps - 20 hold    Exercises - Standing Shoulder Horizontal Abduction with Resistance  - 2 x daily - 7 x weekly - 3 sets - 10 reps - Standing Shoulder Diagonal Horizontal Abduction 60/120 Degrees with Resistance  - 2 x daily - 7 x weekly - 3 sets - 10 reps - Seated Upper Trapezius Stretch  - 2 x daily - 7 x weekly - 3 sets - 30 hold  08/13/24: - 3D thoracic excursion - Wback  08/15/24:  radial nerve glide    ASSESSMENT:  CLINICAL IMPRESSION: Patient demonstrates decreased neck pain, increased cervical spine mobility, and improved activity tolerance. Patient also demonstrates increased endurance with aerobic based exercise during today's session. Patient continues to respond well to manual therapy,  traction and STM of right upper trapezius (decreased tone noted). Patient would continue to benefit from skilled physical therapy for decreased neck pain as this is the only goal unmet following today's session.   Eval: Patient is a 73 y.o. female who was seen today for physical therapy evaluation and treatment for M54.2 (ICD-10-CM) - Cervicalgia. Patient demonstrates decreased cervical spine ROM, increased pain in neck, headaches, and increased tension in bilateral upper trapezius and paraspinals of cervical spine. Patient also suffering from increased frequency and intensity of  headaches 2-3 times per week. Patient also demonstrates tenderness to C3-C7 vertebrae CPAs and UPAs especially on the R side of spine. Patient requires moderate verbal cuing for relaxation of cervical spine musculature with difficulty during HEP instruction. Patient would benefit from skilled physical therapy for decreased headache frequency and intensity, increased pain free ROM of cervical spine, increased strength in paraspinal and other postural musculature, and improved posture for improved ability to perform ADLs without symptom reproduction, return to higher level of function with ADLs, and progress towards therapy goals.   OBJECTIVE IMPAIRMENTS: decreased activity tolerance, decreased endurance, decreased mobility, decreased ROM, decreased strength, impaired sensation, and pain.   ACTIVITY LIMITATIONS: carrying, lifting, and reach over head  PARTICIPATION LIMITATIONS: meal prep, cleaning, laundry, shopping, community activity, and yard work  PERSONAL FACTORS: Age, Fitness, and Time since onset of injury/illness/exacerbation are also affecting patient's functional outcome.   REHAB POTENTIAL: Fair Chronic in nature  CLINICAL DECISION MAKING: Stable/uncomplicated  EVALUATION COMPLEXITY: Low   GOALS: Goals reviewed with patient? Yes  SHORT TERM GOALS: Target date: 08/19/24  Pt will be independent with HEP in order to demonstrate participation in Physical Therapy POC.  Baseline: Goal status: MET  2.  Pt will report 2/10 pain with cervical mobility in order to demonstrate improved pain with ADLs.  Baseline:  Goal status: MET  LONG TERM GOALS: Target date: 09/09/24  Pt will report decreased cervicogenic caused headaches to less than 2 per week in order to demonstrate improved quality of life.  Baseline: 2-3x per week. (1-2 reported 09/30) Goal status: MET  2.  Pt will improve cervical ROM (flex/ext/lateral flexion/rotation) by 20 degrees combined in order to demonstrate improved  functional ambulatory capacity in community setting.  Baseline: see objective.  Goal status: MET  3.  Pt will improve NDI score to 0 in order to demonstrate decreased pain with functional goals and outcomes. Baseline: see objective.  Goal status: IN PROGRESS  4.  Pt will report 1/10 pain with cervical mobility in order to demonstrate reduced pain with ADLs that require use of cervical spine musculature (driving, washing hair, reaching to elevated cabinet).  Baseline: see objective.  Goal status: INITIAL     PLAN:  PT FREQUENCY: 1-2x/week  PT DURATION: 6 weeks  PLANNED INTERVENTIONS: 97110-Therapeutic exercises, 97530- Therapeutic activity, [id number]- Neuromuscular re-education, 97535- Self Care, [id number]- Manual therapy, Patient/Family education, Spinal mobilization, Cryotherapy, and Moist heat  PLAN FOR NEXT SESSION: continue manual traction, and STM of right upper trap, progress cervical and scapular mobility, progress stretching of right upper extremity, neck, and quadrant, formal assessment of trapezius musculature.  F/U with radial nerve glides.  Lang Ada, PT, DPT [facility] Office: [phone] 9:05 AM, 08/20/24

## 2024-08-23 ENCOUNTER — Ambulatory Visit (HOSPITAL_COMMUNITY): Attending: Orthopedic Surgery

## 2024-08-23 ENCOUNTER — Encounter (HOSPITAL_COMMUNITY): Payer: Self-pay

## 2024-08-23 DIAGNOSIS — M542 Cervicalgia: Secondary | ICD-10-CM | POA: Insufficient documentation

## 2024-08-23 DIAGNOSIS — G8929 Other chronic pain: Secondary | ICD-10-CM | POA: Diagnosis present

## 2024-08-23 DIAGNOSIS — R202 Paresthesia of skin: Secondary | ICD-10-CM | POA: Diagnosis present

## 2024-08-23 DIAGNOSIS — M25511 Pain in right shoulder: Secondary | ICD-10-CM | POA: Diagnosis present

## 2024-08-23 DIAGNOSIS — R200 Anesthesia of skin: Secondary | ICD-10-CM | POA: Diagnosis present

## 2024-08-23 NOTE — Therapy (Signed)
 OUTPATIENT PHYSICAL THERAPY CERVICAL TREATMENT   Patient Name: Victoria Dominguez STRADLEY MRN: [id number] DOB:1951/11/04, 73 y.o., female Today's Date: 08/23/2024  END OF SESSION:  PT End of Session - 08/23/24 0815     Visit Number 8    Number of Visits 8    Date for Recertification  09/02/24    Authorization Type MEDICARE PART A AND B    Authorization Time Period no auth    Progress Note Due on Visit 8    PT Start Time 0815    PT Stop Time 0855    PT Time Calculation (min) 40 min    Activity Tolerance Patient tolerated treatment well    Behavior During Therapy WFL for tasks assessed/performed             Past Medical History:  Diagnosis Date   Hypertension    Past Surgical History:  Procedure Laterality Date   ABDOMINAL HYSTERECTOMY     CATARACT EXTRACTION W/PHACO Right 03/02/2021   Procedure: CATARACT EXTRACTION PHACO AND INTRAOCULAR LENS PLACEMENT (IOC) RIGHT 4.23 00:31.5;  Surgeon: Jaye Fallow, MD;  Location: [facility];  Service: Ophthalmology;  Laterality: Right;  COVID + 01-10-21   CATARACT EXTRACTION W/PHACO Left 03/16/2021   Procedure: CATARACT EXTRACTION PHACO AND INTRAOCULAR LENS PLACEMENT (IOC) LEFT 7.06 00:40.7;  Surgeon: Jaye Fallow, MD;  Location: [facility];  Service: Ophthalmology;  Laterality: Left;   ORIF WRIST FRACTURE Left 09/05/2019   Procedure: OPEN REDUCTION INTERNAL FIXATION (ORIF) WRIST FRACTURE;  Surgeon: Margrette Taft BRAVO, MD;  Location: [facility];  Service: Orthopedics;  Laterality: Left;   Patient Active Problem List   Diagnosis Date Noted   Osteoporosis 01/06/2020   Fracture of left wrist with routine healing 10/04/2019   Fracture, [place], left, closed 09/09/2019   S/P ORIF (open reduction internal fixation) fracture 09/05/2019 left wrist  09/09/2019   Left wrist fracture    Hypertension 09/02/2019    PCP: Valora Agent, MD   REFERRING PROVIDER: Beverley Evalene BIRCH, MD  REFERRING DIAG: M54.2 (ICD-10-CM) -  Cervicalgia  THERAPY DIAG:  Cervicalgia  Numbness and tingling of right arm  Chronic right shoulder pain  Rationale for Evaluation and Treatment: Rehabilitation  ONSET DATE: been going on for a while, 3-4 months ago it has gotten worse.  SUBJECTIVE:                                                                                                                                                                                                         SUBJECTIVE STATEMENT: Pt states neck is feeling  better today still slight pain in right neck /shoulder region, 2/10 rated. Pt states she is scheduled for MRI of the neck Monday. Pt states having some tingling in the R hand. Pt attributes increased pain to the weather.   Eval: Pt states neck pain has been going on for a while now but has worsening in the last few months. States she went to doc for Cortizone shot but wanted her to do therapy first. Pt states pain gets so bad she can hardly lift arm (right). Pt as broken both wrists in the past and wonders if the nerve blocks on RUE could have something to do with the numbness sensation she has all the way down the arm to the hand almost all the time. Pt states she would like to decrease the pain and perform ADLs that require lifting overhead with less pain.     Hand dominance: Right  PERTINENT HISTORY:  Has broken both wrists  PAIN:  Are you having pain? Yes: NPRS scale: 4/10 Pain location: lower cervical region to upper trap and shoulder, all the way down to the hand, primarily medial 3 fingers Pain description: burning, shooting pain Aggravating factors: none reported Relieving factors: alieve, heat, extension, biofreeze   PRECAUTIONS: None  RED FLAGS: None     WEIGHT BEARING RESTRICTIONS: No  FALLS:  Has patient fallen in last 6 months? No  OCCUPATION: Retired, office work in [place]   PLOF: Independent and Independent with basic ADLs  PATIENT GOALS: decrease the pain and  numbness in right arm, overhead activity performance improvement.  NEXT MD VISIT: 08/19/24  OBJECTIVE:  Note: Objective measures were completed at Evaluation unless otherwise noted.  DIAGNOSTIC FINDINGS:  None  PATIENT SURVEYS:  NDI: 7/50 08/20/24:  5 / 50 = 10.0 % COGNITION: Overall cognitive status: Within functional limits for tasks assessed  SENSATION: Light touch: Impaired , right arm consistently numb to the hand  POSTURE: rounded shoulders and forward head  PALPATION: Tenderness to palpation to paraspinals of cervical spine especially of right side and upper trap region.   CERVICAL ROM:   Active ROM A/PROM (deg) eval 08/20/24  Flexion 55, slight pain 60, tingling feeling between neck and shoulder  Extension 35, pain worse than flexion 45, tingling feeling between neck and shoulder  Right lateral flexion 25, increased pain 40, worse pain  Left lateral flexion 40, no pain 45, stretch  Right rotation 64, worse to the right 62, not much pain  Left rotation 46, pain 60, about the same as R   (Blank rows = not tested)  UPPER EXTREMITY ROM:  Active ROM Right eval Left eval  Shoulder flexion    Shoulder extension    Shoulder abduction    Shoulder adduction Bad pain   Shoulder extension    Shoulder internal rotation    Shoulder external rotation    Elbow flexion    Elbow extension    Wrist flexion    Wrist extension    Wrist ulnar deviation    Wrist radial deviation    Wrist pronation    Wrist supination     (Blank rows = not tested)  UPPER EXTREMITY MMT:  MMT Right eval Left eval  Shoulder flexion 4 4  Shoulder extension    Shoulder abduction 4-, pain 4  Shoulder adduction    Shoulder extension 4 4  Shoulder internal rotation 4, slight pain 4  Shoulder external rotation 4, slight pain 4  Middle trapezius    Lower trapezius  Elbow flexion 4+ 4+  Elbow extension 4+ 4+  Wrist flexion    Wrist extension    Wrist ulnar deviation    Wrist radial  deviation    Wrist pronation    Wrist supination    Grip strength WNL WNL   (Blank rows = not tested)  CERVICAL SPECIAL TESTS:  Spurling's test: Positive and Distraction test: Positive Right side   TREATMENT DATE:  08/23/2024  Manual Therapy: -STM of Cervical Paraspinal musculature -Manual traction, 5 reps, 10 seconds on, 5 seconds off -Cervical PROM 4 reps each direction Therapeutic Exercise: -UBE bike, 2 minutes forward, 2 minutes bwd, pt reports increased pain in rihgt neck and shoulder, level 3 resistance -Shoulder Rows/extensions, 2 sets of 10 reps, GTB at chest level, pt cued for eccentric control and scapular retraction -Lat pull downs, 2 sets of 10 reps, plate 4, pt cued for max elbow extension and pulling down to chest level -Standing ball roll up wall with push up at the bottom and cervical rotations at the top , 2 sets of 5 reps, pt cued for walking underneath ball and increased cervical ROM Therapeutic Activity: -Shoulder flexions, 4lb dumbells, 1 set of 8 with shoulder shrug at the top, pt cued for eccentric control -Shoulder abductions, 2lb dumbbells, 1 set of 8 reps, pt cued for eccentric control -Standing D2 shoulder flexions, 2lb dumbbell, pt cued for UE rotation, bilaterally  08/15/24: UBE bike, 2 minutes forward, 2 minute bwd, pt reports increased pain in R shoulder, no rest break required Standing:  RTB ER 2x 10 5 (cueing to reduce wrist extension) Seated:  [place] Lower Trap pull down 3Pl 2x 10  Radial nerve glide 3x 10 Prone:  Straight arm raise with 1# 10x   Rows 10x Manual: supine cervical traction, sub occipital release, STM bil, Rt>Lt traps, paraspinals  08/13/24: UBE bike, 2 minutes forward, 2 minute bwd, pt reports increased pain in R shoulder, no rest break required Seated:  3D thoracic excursion 5x Manual: supine cervical traction, sub occipital release, STM bil, Rt>Lt traps, paraspinals Seated:   Wback then elbows down (lower trap  activation) reports of decreased tingling Upper Trap stretch, 30 holds each direction Levator Scap stretch, 30 each direction    PATIENT EDUCATION:  Education details: Pt was educated on findings of PT evaluation, prognosis, frequency of therapy visits and rationale, attendance policy, and HEP if given.   Person educated: Patient Education method: Explanation, Verbal cues, and Handouts Education comprehension: verbalized understanding, verbal cues required, and needs further education  HOME EXERCISE PROGRAM: Access Code: [email or web address] URL: [email or web address] Date: 07/29/2024 Prepared by: Lang Ada  Exercises - Seated Scapular Retraction  - 1 x daily - 7 x weekly - 3 sets - 10 reps - 3 hold - Seated Cervical Retraction  - 1 x daily - 7 x weekly - 3 sets - 10 reps - 3 hold - Seated Upper Trapezius Stretch  - 1 x daily - 7 x weekly - 1 sets - 3 reps - 20 hold    Exercises - Standing Shoulder Horizontal Abduction with Resistance  - 2 x daily - 7 x weekly - 3 sets - 10 reps - Standing Shoulder Diagonal Horizontal Abduction 60/120 Degrees with Resistance  - 2 x daily - 7 x weekly - 3 sets - 10 reps - Seated Upper Trapezius Stretch  - 2 x daily - 7 x weekly - 3 sets - 30 hold  08/13/24: - 3D thoracic excursion - Wback  08/15/24:  radial nerve glide    ASSESSMENT:  CLINICAL IMPRESSION: Patient demonstrates decreased neck pain, increased cervical spine mobility, and improved activity tolerance. Patient also demonstrates increased endurance with aerobic based exercise during today's session, increased resistance. Pt continues to demonstrate increased tone in R upper trapezius musculature and abnormal sensation in RUE during some exercises. Patient continues to respond well to manual therapy, traction and STM of right upper trapezius (decreased tone noted). Patient would continue to benefit from skilled physical therapy for increased cervical mobility, increased postural  strength, and improved activity tolerance for improved quality of life, improved independence with management of neck pain and continued progress towards therapy goals.    Eval: Patient is a 73 y.o. female who was seen today for physical therapy evaluation and treatment for M54.2 (ICD-10-CM) - Cervicalgia. Patient demonstrates decreased cervical spine ROM, increased pain in neck, headaches, and increased tension in bilateral upper trapezius and paraspinals of cervical spine. Patient also suffering from increased frequency and intensity of headaches 2-3 times per week. Patient also demonstrates tenderness to C3-C7 vertebrae CPAs and UPAs especially on the R side of spine. Patient requires moderate verbal cuing for relaxation of cervical spine musculature with difficulty during HEP instruction. Patient would benefit from skilled physical therapy for decreased headache frequency and intensity, increased pain free ROM of cervical spine, increased strength in paraspinal and other postural musculature, and improved posture for improved ability to perform ADLs without symptom reproduction, return to higher level of function with ADLs, and progress towards therapy goals.   OBJECTIVE IMPAIRMENTS: decreased activity tolerance, decreased endurance, decreased mobility, decreased ROM, decreased strength, impaired sensation, and pain.   ACTIVITY LIMITATIONS: carrying, lifting, and reach over head  PARTICIPATION LIMITATIONS: meal prep, cleaning, laundry, shopping, community activity, and yard work  PERSONAL FACTORS: Age, Fitness, and Time since onset of injury/illness/exacerbation are also affecting patient's functional outcome.   REHAB POTENTIAL: Fair Chronic in nature  CLINICAL DECISION MAKING: Stable/uncomplicated  EVALUATION COMPLEXITY: Low   GOALS: Goals reviewed with patient? Yes  SHORT TERM GOALS: Target date: 08/19/24  Pt will be independent with HEP in order to demonstrate participation in  Physical Therapy POC.  Baseline: Goal status: MET  2.  Pt will report 2/10 pain with cervical mobility in order to demonstrate improved pain with ADLs.  Baseline:  Goal status: MET  LONG TERM GOALS: Target date: 09/09/24  Pt will report decreased cervicogenic caused headaches to less than 2 per week in order to demonstrate improved quality of life.  Baseline: 2-3x per week. (1-2 reported 09/30) Goal status: MET  2.  Pt will improve cervical ROM (flex/ext/lateral flexion/rotation) by 20 degrees combined in order to demonstrate improved functional ambulatory capacity in community setting.  Baseline: see objective.  Goal status: MET  3.  Pt will improve NDI score to 0 in order to demonstrate decreased pain with functional goals and outcomes. Baseline: see objective.  Goal status: IN PROGRESS  4.  Pt will report 1/10 pain with cervical mobility in order to demonstrate reduced pain with ADLs that require use of cervical spine musculature (driving, washing hair, reaching to elevated cabinet).  Baseline: see objective.  Goal status: INITIAL     PLAN:  PT FREQUENCY: 1-2x/week  PT DURATION: 6 weeks  PLANNED INTERVENTIONS: 97110-Therapeutic exercises, 97530- Therapeutic activity, [id number]- Neuromuscular re-education, 97535- Self Care, [id number]- Manual therapy, Patient/Family education, Spinal mobilization, Cryotherapy, and Moist heat  PLAN FOR NEXT SESSION: continue manual traction, and STM of right upper trap, progress cervical and scapular mobility,  progress stretching of right upper extremity, neck, and quadrant, formal assessment of trapezius musculature.  F/U with radial nerve glides.  Marlena Barbato, PT, DPT [facility] Office: [phone] 9:00 AM, 08/23/24

## 2024-08-26 ENCOUNTER — Encounter

## 2024-08-27 ENCOUNTER — Ambulatory Visit
Admission: RE | Admit: 2024-08-27 | Discharge: 2024-08-27 | Disposition: A | Discharge status: Home / Self Care | Source: Ambulatory Visit | Attending: Family Medicine | Admitting: Family Medicine

## 2024-08-27 DIAGNOSIS — Z1231 Encounter for screening mammogram for malignant neoplasm of breast: Secondary | ICD-10-CM | POA: Diagnosis present

## 2024-08-28 ENCOUNTER — Ambulatory Visit (HOSPITAL_COMMUNITY)

## 2024-08-28 ENCOUNTER — Encounter (HOSPITAL_COMMUNITY): Payer: Self-pay

## 2024-08-28 DIAGNOSIS — M542 Cervicalgia: Secondary | ICD-10-CM | POA: Diagnosis not present

## 2024-08-28 DIAGNOSIS — G8929 Other chronic pain: Secondary | ICD-10-CM

## 2024-08-28 DIAGNOSIS — R200 Anesthesia of skin: Secondary | ICD-10-CM

## 2024-08-28 NOTE — Therapy (Signed)
 OUTPATIENT PHYSICAL THERAPY CERVICAL TREATMENT   Patient Name: Victoria Dominguez MRN: [id number] DOB:1951/08/31, 73 y.o., female Today's Date: 08/28/2024  END OF SESSION:  PT End of Session - 08/28/24 1545     Visit Number 9    Number of Visits 13    Date for Recertification  09/09/24    Authorization Type MEDICARE PART A AND B    Authorization Time Period no auth    Progress Note Due on Visit 17   PN complete visit #7   PT Start Time 1547    PT Stop Time 1625    PT Time Calculation (min) 38 min    Activity Tolerance Patient tolerated treatment well    Behavior During Therapy WFL for tasks assessed/performed             Past Medical History:  Diagnosis Date   Hypertension    Past Surgical History:  Procedure Laterality Date   ABDOMINAL HYSTERECTOMY     CATARACT EXTRACTION W/PHACO Right 03/02/2021   Procedure: CATARACT EXTRACTION PHACO AND INTRAOCULAR LENS PLACEMENT (IOC) RIGHT 4.23 00:31.5;  Surgeon: Jaye Fallow, MD;  Location: [facility];  Service: Ophthalmology;  Laterality: Right;  COVID + 01-10-21   CATARACT EXTRACTION W/PHACO Left 03/16/2021   Procedure: CATARACT EXTRACTION PHACO AND INTRAOCULAR LENS PLACEMENT (IOC) LEFT 7.06 00:40.7;  Surgeon: Jaye Fallow, MD;  Location: [facility];  Service: Ophthalmology;  Laterality: Left;   ORIF WRIST FRACTURE Left 09/05/2019   Procedure: OPEN REDUCTION INTERNAL FIXATION (ORIF) WRIST FRACTURE;  Surgeon: Margrette Taft BRAVO, MD;  Location: [facility];  Service: Orthopedics;  Laterality: Left;   Patient Active Problem List   Diagnosis Date Noted   Osteoporosis 01/06/2020   Fracture of left wrist with routine healing 10/04/2019   Fracture, [place], left, closed 09/09/2019   S/P ORIF (open reduction internal fixation) fracture 09/05/2019 left wrist  09/09/2019   Left wrist fracture    Hypertension 09/02/2019    PCP: Valora Agent, MD   REFERRING PROVIDER: Beverley Evalene BIRCH, MD  REFERRING DIAG: M54.2  (ICD-10-CM) - Cervicalgia  THERAPY DIAG:  Cervicalgia  Numbness and tingling of right arm  Chronic right shoulder pain  Rationale for Evaluation and Treatment: Rehabilitation  ONSET DATE: been going on for a while, 3-4 months ago it has gotten worse.  SUBJECTIVE:                                                                                                                                                                                                         SUBJECTIVE STATEMENT:  MRI done on this past Monday, goes to MD on 09/09/24.  Pt continues to have tingling and numbness down to fingers in Rt hand.  Reports some pain relief with massage.    Eval: Pt states neck pain has been going on for a while now but has worsening in the last few months. States she went to doc for Cortizone shot but wanted her to do therapy first. Pt states pain gets so bad she can hardly lift arm (right). Pt as broken both wrists in the past and wonders if the nerve blocks on RUE could have something to do with the numbness sensation she has all the way down the arm to the hand almost all the time. Pt states she would like to decrease the pain and perform ADLs that require lifting overhead with less pain.     Hand dominance: Right  PERTINENT HISTORY:  Has broken both wrists  PAIN:  Are you having pain? Yes: NPRS scale: 6/10 Pain location: lower cervical region to upper trap and shoulder, all the way down to the hand, primarily medial 3 fingers Pain description: burning, shooting pain Aggravating factors: none reported Relieving factors: alieve, heat, extension, biofreeze   PRECAUTIONS: None  RED FLAGS: None     WEIGHT BEARING RESTRICTIONS: No  FALLS:  Has patient fallen in last 6 months? No  OCCUPATION: Retired, office work in [place]   PLOF: Independent and Independent with basic ADLs  PATIENT GOALS: decrease the pain and numbness in right arm, overhead activity performance  improvement.  NEXT MD VISIT: 08/19/24  OBJECTIVE:  Note: Objective measures were completed at Evaluation unless otherwise noted.  DIAGNOSTIC FINDINGS:  None  PATIENT SURVEYS:  NDI: 7/50 08/20/24:  5 / 50 = 10.0 % COGNITION: Overall cognitive status: Within functional limits for tasks assessed  SENSATION: Light touch: Impaired , right arm consistently numb to the hand  POSTURE: rounded shoulders and forward head  PALPATION: Tenderness to palpation to paraspinals of cervical spine especially of right side and upper trap region.   CERVICAL ROM:   Active ROM A/PROM (deg) eval 08/20/24  Flexion 55, slight pain 60, tingling feeling between neck and shoulder  Extension 35, pain worse than flexion 45, tingling feeling between neck and shoulder  Right lateral flexion 25, increased pain 40, worse pain  Left lateral flexion 40, no pain 45, stretch  Right rotation 64, worse to the right 62, not much pain  Left rotation 46, pain 60, about the same as R   (Blank rows = not tested)  UPPER EXTREMITY ROM:  Active ROM Right eval Left eval  Shoulder flexion    Shoulder extension    Shoulder abduction    Shoulder adduction Bad pain   Shoulder extension    Shoulder internal rotation    Shoulder external rotation    Elbow flexion    Elbow extension    Wrist flexion    Wrist extension    Wrist ulnar deviation    Wrist radial deviation    Wrist pronation    Wrist supination     (Blank rows = not tested)  UPPER EXTREMITY MMT:  MMT Right eval Left eval  Shoulder flexion 4 4  Shoulder extension    Shoulder abduction 4-, pain 4  Shoulder adduction    Shoulder extension 4 4  Shoulder internal rotation 4, slight pain 4  Shoulder external rotation 4, slight pain 4  Middle trapezius    Lower trapezius    Elbow flexion 4+ 4+  Elbow extension 4+ 4+  Wrist flexion    Wrist extension    Wrist ulnar deviation    Wrist radial deviation    Wrist pronation    Wrist supination     Grip strength WNL WNL   (Blank rows = not tested)  CERVICAL SPECIAL TESTS:  Spurling's test: Positive and Distraction test: Positive Right side   TREATMENT DATE:  08/28/24: -UBE bike, 2 minutes forward, 2 minutes bwd, pt reports increased pain in rihgt neck and shoulder, level 3 resistance Manual STM to cervical musculature including PROM, manual traction, suboccipital release, trigger point release to UT and  08/23/2024  Manual Therapy: -STM of Cervical Paraspinal musculature -Manual traction, 5 reps, 10 seconds on, 5 seconds off -Cervical PROM 4 reps each direction Therapeutic Exercise: -UBE bike, 2 minutes forward, 2 minutes bwd, pt reports increased pain in rihgt neck and shoulder, level 3 resistance -Shoulder Rows/extensions, 2 sets of 10 reps, GTB at chest level, pt cued for eccentric control and scapular retraction -Lat pull downs, 2 sets of 10 reps, plate 4, pt cued for max elbow extension and pulling down to chest level -Standing ball roll up wall with push up at the bottom and cervical rotations at the top , 2 sets of 5 reps, pt cued for walking underneath ball and increased cervical ROM Therapeutic Activity: -Shoulder flexions, 4lb dumbells, 1 set of 8 with shoulder shrug at the top, pt cued for eccentric control -Shoulder abductions, 2lb dumbbells, 1 set of 8 reps, pt cued for eccentric control -Standing D2 shoulder flexions, 2lb dumbbell, pt cued for UE rotation, bilaterally  08/15/24: UBE bike, 2 minutes forward, 2 minute bwd, pt reports increased pain in R shoulder, no rest break required Standing:  RTB ER 2x 10 5 (cueing to reduce wrist extension) Seated:  [place] Lower Trap pull down 3Pl 2x 10  Radial nerve glide 3x 10 Prone:  Straight arm raise with 1# 10x   Rows 10x Manual: supine cervical traction, sub occipital release, STM bil, Rt>Lt traps, paraspinals  08/13/24: UBE bike, 2 minutes forward, 2 minute bwd, pt reports increased pain in R shoulder, no  rest break required Seated:  3D thoracic excursion 5x Manual: supine cervical traction, sub occipital release, STM bil, Rt>Lt traps, paraspinals Seated:   Wback then elbows down (lower trap activation) reports of decreased tingling Upper Trap stretch, 30 holds each direction Levator Scap stretch, 30 each direction    PATIENT EDUCATION:  Education details: Pt was educated on findings of PT evaluation, prognosis, frequency of therapy visits and rationale, attendance policy, and HEP if given.   Person educated: Patient Education method: Explanation, Verbal cues, and Handouts Education comprehension: verbalized understanding, verbal cues required, and needs further education  HOME EXERCISE PROGRAM: Access Code: [email or web address] URL: [email or web address] Date: 07/29/2024 Prepared by: Lang Ada  Exercises - Seated Scapular Retraction  - 1 x daily - 7 x weekly - 3 sets - 10 reps - 3 hold - Seated Cervical Retraction  - 1 x daily - 7 x weekly - 3 sets - 10 reps - 3 hold - Seated Upper Trapezius Stretch  - 1 x daily - 7 x weekly - 1 sets - 3 reps - 20 hold    Exercises - Standing Shoulder Horizontal Abduction with Resistance  - 2 x daily - 7 x weekly - 3 sets - 10 reps - Standing Shoulder Diagonal Horizontal Abduction 60/120 Degrees with Resistance  - 2 x daily - 7 x weekly - 3 sets -  10 reps - Seated Upper Trapezius Stretch  - 2 x daily - 7 x weekly - 3 sets - 30 hold  08/13/24: - 3D thoracic excursion - Wback  08/15/24:  radial nerve glide  08/28/24: Corner pec stretch 3x 30    ASSESSMENT:  CLINICAL IMPRESSION: Session focus on neck mobility, manual techniques to address cervical musculature tightness and postural strengthening.  Increased time this session on manual as pt reports biggest relief following PT sessions with moderate tightness.  Pt reports of decreased radicular symptoms when cueing to rotate palms up.  Added pec stretch with reports of relief.   Postural training to strengthening lower traps to increase relaxation on upper traps.  EOS pt left pain free with no radicular symptoms (had radicular symptoms to all fingers at beginning of session.)  Eval: Patient is a 73 y.o. female who was seen today for physical therapy evaluation and treatment for M54.2 (ICD-10-CM) - Cervicalgia. Patient demonstrates decreased cervical spine ROM, increased pain in neck, headaches, and increased tension in bilateral upper trapezius and paraspinals of cervical spine. Patient also suffering from increased frequency and intensity of headaches 2-3 times per week. Patient also demonstrates tenderness to C3-C7 vertebrae CPAs and UPAs especially on the R side of spine. Patient requires moderate verbal cuing for relaxation of cervical spine musculature with difficulty during HEP instruction. Patient would benefit from skilled physical therapy for decreased headache frequency and intensity, increased pain free ROM of cervical spine, increased strength in paraspinal and other postural musculature, and improved posture for improved ability to perform ADLs without symptom reproduction, return to higher level of function with ADLs, and progress towards therapy goals.   OBJECTIVE IMPAIRMENTS: decreased activity tolerance, decreased endurance, decreased mobility, decreased ROM, decreased strength, impaired sensation, and pain.   ACTIVITY LIMITATIONS: carrying, lifting, and reach over head  PARTICIPATION LIMITATIONS: meal prep, cleaning, laundry, shopping, community activity, and yard work  PERSONAL FACTORS: Age, Fitness, and Time since onset of injury/illness/exacerbation are also affecting patient's functional outcome.   REHAB POTENTIAL: Fair Chronic in nature  CLINICAL DECISION MAKING: Stable/uncomplicated  EVALUATION COMPLEXITY: Low   GOALS: Goals reviewed with patient? Yes  SHORT TERM GOALS: Target date: 08/19/24  Pt will be independent with HEP in order to  demonstrate participation in Physical Therapy POC.  Baseline: Goal status: MET  2.  Pt will report 2/10 pain with cervical mobility in order to demonstrate improved pain with ADLs.  Baseline:  Goal status: MET  LONG TERM GOALS: Target date: 09/09/24  Pt will report decreased cervicogenic caused headaches to less than 2 per week in order to demonstrate improved quality of life.  Baseline: 2-3x per week. (1-2 reported 09/30) Goal status: MET  2.  Pt will improve cervical ROM (flex/ext/lateral flexion/rotation) by 20 degrees combined in order to demonstrate improved functional ambulatory capacity in community setting.  Baseline: see objective.  Goal status: MET  3.  Pt will improve NDI score to 0 in order to demonstrate decreased pain with functional goals and outcomes. Baseline: see objective.  Goal status: IN PROGRESS  4.  Pt will report 1/10 pain with cervical mobility in order to demonstrate reduced pain with ADLs that require use of cervical spine musculature (driving, washing hair, reaching to elevated cabinet).  Baseline: see objective.  Goal status: INITIAL     PLAN:  PT FREQUENCY: 1-2x/week  PT DURATION: 6 weeks  PLANNED INTERVENTIONS: 97110-Therapeutic exercises, 97530- Therapeutic activity, [id number]- Neuromuscular re-education, 97535- Self Care, [id number]- Manual therapy, Patient/Family education, Spinal mobilization,  Cryotherapy, and Moist heat  PLAN FOR NEXT SESSION: continue manual traction, and STM of right upper trap, progress cervical and scapular mobility, progress stretching of right upper extremity, neck, and quadrant, formal assessment of trapezius musculature.   Augustin Mclean, LPTA/CLT; CBIS [phone]  4:44 PM, 08/28/24

## 2024-08-30 ENCOUNTER — Ambulatory Visit (HOSPITAL_COMMUNITY)

## 2024-08-30 ENCOUNTER — Encounter (HOSPITAL_COMMUNITY): Payer: Self-pay

## 2024-08-30 DIAGNOSIS — R200 Anesthesia of skin: Secondary | ICD-10-CM

## 2024-08-30 DIAGNOSIS — G8929 Other chronic pain: Secondary | ICD-10-CM

## 2024-08-30 DIAGNOSIS — M542 Cervicalgia: Secondary | ICD-10-CM | POA: Diagnosis not present

## 2024-08-30 NOTE — Therapy (Signed)
 OUTPATIENT PHYSICAL THERAPY CERVICAL TREATMENT   Patient Name: Victoria Dominguez MRN: [id number] DOB:July 18, 1951, 73 y.o., female Today's Date: 08/30/2024  END OF SESSION:  PT End of Session - 08/30/24 0947     Visit Number 10    Number of Visits 13    Date for Recertification  09/09/24    Authorization Type MEDICARE PART A AND B    Authorization Time Period no auth    Progress Note Due on Visit 17   PN complete visit #7   PT Start Time 0948    PT Stop Time 1026    PT Time Calculation (min) 38 min    Activity Tolerance Patient tolerated treatment well    Behavior During Therapy WFL for tasks assessed/performed             Past Medical History:  Diagnosis Date   Hypertension    Past Surgical History:  Procedure Laterality Date   ABDOMINAL HYSTERECTOMY     CATARACT EXTRACTION W/PHACO Right 03/02/2021   Procedure: CATARACT EXTRACTION PHACO AND INTRAOCULAR LENS PLACEMENT (IOC) RIGHT 4.23 00:31.5;  Surgeon: Jaye Fallow, MD;  Location: [facility];  Service: Ophthalmology;  Laterality: Right;  COVID + 01-10-21   CATARACT EXTRACTION W/PHACO Left 03/16/2021   Procedure: CATARACT EXTRACTION PHACO AND INTRAOCULAR LENS PLACEMENT (IOC) LEFT 7.06 00:40.7;  Surgeon: Jaye Fallow, MD;  Location: [facility];  Service: Ophthalmology;  Laterality: Left;   ORIF WRIST FRACTURE Left 09/05/2019   Procedure: OPEN REDUCTION INTERNAL FIXATION (ORIF) WRIST FRACTURE;  Surgeon: Margrette Taft BRAVO, MD;  Location: [facility];  Service: Orthopedics;  Laterality: Left;   Patient Active Problem List   Diagnosis Date Noted   Osteoporosis 01/06/2020   Fracture of left wrist with routine healing 10/04/2019   Fracture, [place], left, closed 09/09/2019   S/P ORIF (open reduction internal fixation) fracture 09/05/2019 left wrist  09/09/2019   Left wrist fracture    Hypertension 09/02/2019    PCP: Valora Agent, MD   REFERRING PROVIDER: Beverley Evalene BIRCH, MD  REFERRING DIAG: M54.2  (ICD-10-CM) - Cervicalgia  THERAPY DIAG:  Cervicalgia  Numbness and tingling of right arm  Chronic right shoulder pain  Rationale for Evaluation and Treatment: Rehabilitation  ONSET DATE: been going on for a while, 3-4 months ago it has gotten worse.  SUBJECTIVE:                                                                                                                                                                                                         SUBJECTIVE STATEMENT:  08/30/24:  Feeling good today with no reports of pain or radicular symptoms.  Has began the pec stretchs at home that she feels is helpful.  Reports she was in some pain yesterday.  Eval: Pt states neck pain has been going on for a while now but has worsening in the last few months. States she went to doc for Cortizone shot but wanted her to do therapy first. Pt states pain gets so bad she can hardly lift arm (right). Pt as broken both wrists in the past and wonders if the nerve blocks on RUE could have something to do with the numbness sensation she has all the way down the arm to the hand almost all the time. Pt states she would like to decrease the pain and perform ADLs that require lifting overhead with less pain.     Hand dominance: Right  PERTINENT HISTORY:  Has broken both wrists  PAIN:  Are you having pain? Yes: NPRS scale: 6/10 Pain location: lower cervical region to upper trap and shoulder, all the way down to the hand, primarily medial 3 fingers Pain description: burning, shooting pain Aggravating factors: none reported Relieving factors: alieve, heat, extension, biofreeze   PRECAUTIONS: None  RED FLAGS: None     WEIGHT BEARING RESTRICTIONS: No  FALLS:  Has patient fallen in last 6 months? No  OCCUPATION: Retired, office work in [place]   PLOF: Independent and Independent with basic ADLs  PATIENT GOALS: decrease the pain and numbness in right arm, overhead activity performance  improvement.  NEXT MD VISIT: 08/19/24  OBJECTIVE:  Note: Objective measures were completed at Evaluation unless otherwise noted.  DIAGNOSTIC FINDINGS:  None  PATIENT SURVEYS:  NDI: 7/50 08/20/24:  5 / 50 = 10.0 % COGNITION: Overall cognitive status: Within functional limits for tasks assessed  SENSATION: Light touch: Impaired , right arm consistently numb to the hand  POSTURE: rounded shoulders and forward head  PALPATION: Tenderness to palpation to paraspinals of cervical spine especially of right side and upper trap region.   CERVICAL ROM:   Active ROM A/PROM (deg) eval 08/20/24  Flexion 55, slight pain 60, tingling feeling between neck and shoulder  Extension 35, pain worse than flexion 45, tingling feeling between neck and shoulder  Right lateral flexion 25, increased pain 40, worse pain  Left lateral flexion 40, no pain 45, stretch  Right rotation 64, worse to the right 62, not much pain  Left rotation 46, pain 60, about the same as R   (Blank rows = not tested)  UPPER EXTREMITY ROM:  Active ROM Right eval Left eval  Shoulder flexion    Shoulder extension    Shoulder abduction    Shoulder adduction Bad pain   Shoulder extension    Shoulder internal rotation    Shoulder external rotation    Elbow flexion    Elbow extension    Wrist flexion    Wrist extension    Wrist ulnar deviation    Wrist radial deviation    Wrist pronation    Wrist supination     (Blank rows = not tested)  UPPER EXTREMITY MMT:  MMT Right eval Left eval  Shoulder flexion 4 4  Shoulder extension    Shoulder abduction 4-, pain 4  Shoulder adduction    Shoulder extension 4 4  Shoulder internal rotation 4, slight pain 4  Shoulder external rotation 4, slight pain 4  Middle trapezius    Lower trapezius    Elbow flexion 4+ 4+  Elbow extension 4+ 4+  Wrist flexion    Wrist extension    Wrist ulnar deviation    Wrist radial deviation    Wrist pronation    Wrist supination     Grip strength WNL WNL   (Blank rows = not tested)  CERVICAL SPECIAL TESTS:  Spurling's test: Positive and Distraction test: Positive Right side   TREATMENT DATE:  08/30/24: - UBE backward L3 resistance x4 min, attempted 5 minutes but unable due to reports of radicular symptoms. - Lat pull down 2x 10 plate 4, pt cued for max elbow extension and pulling down to chest level Manual STM to cervical musculature including PROM, manual traction, suboccipital release, trigger point release to UT - Corner stretch 2x 30 - UT stretch 2x 30 each side  08/28/24: -UBE bike, 2 minutes forward, 2 minutes bwd, pt reports increased pain in rihgt neck and shoulder, level 3 resistance Manual STM to cervical musculature including PROM, manual traction, suboccipital release, trigger point release to UT and  08/23/2024  Manual Therapy: -STM of Cervical Paraspinal musculature -Manual traction, 5 reps, 10 seconds on, 5 seconds off -Cervical PROM 4 reps each direction Therapeutic Exercise: -UBE bike, 2 minutes forward, 2 minutes bwd, pt reports increased pain in rihgt neck and shoulder, level 3 resistance -Shoulder Rows/extensions, 2 sets of 10 reps, GTB at chest level, pt cued for eccentric control and scapular retraction -Lat pull downs, 2 sets of 10 reps, plate 4, pt cued for max elbow extension and pulling down to chest level -Standing ball roll up wall with push up at the bottom and cervical rotations at the top , 2 sets of 5 reps, pt cued for walking underneath ball and increased cervical ROM Therapeutic Activity: -Shoulder flexions, 4lb dumbells, 1 set of 8 with shoulder shrug at the top, pt cued for eccentric control -Shoulder abductions, 2lb dumbbells, 1 set of 8 reps, pt cued for eccentric control -Standing D2 shoulder flexions, 2lb dumbbell, pt cued for UE rotation, bilaterally  08/15/24: UBE bike, 2 minutes forward, 2 minute bwd, pt reports increased pain in R shoulder, no rest break  required Standing:  RTB ER 2x 10 5 (cueing to reduce wrist extension) Seated:  [place] Lower Trap pull down 3Pl 2x 10  Radial nerve glide 3x 10 Prone:  Straight arm raise with 1# 10x   Rows 10x Manual: supine cervical traction, sub occipital release, STM bil, Rt>Lt traps, paraspinals  08/13/24: UBE bike, 2 minutes forward, 2 minute bwd, pt reports increased pain in R shoulder, no rest break required Seated:  3D thoracic excursion 5x Manual: supine cervical traction, sub occipital release, STM bil, Rt>Lt traps, paraspinals Seated:   Wback then elbows down (lower trap activation) reports of decreased tingling Upper Trap stretch, 30 holds each direction Levator Scap stretch, 30 each direction    PATIENT EDUCATION:  Education details: Pt was educated on findings of PT evaluation, prognosis, frequency of therapy visits and rationale, attendance policy, and HEP if given.   Person educated: Patient Education method: Explanation, Verbal cues, and Handouts Education comprehension: verbalized understanding, verbal cues required, and needs further education  HOME EXERCISE PROGRAM: Access Code: [email or web address] URL: [email or web address] Date: 07/29/2024 Prepared by: Lang Ada  Exercises - Seated Scapular Retraction  - 1 x daily - 7 x weekly - 3 sets - 10 reps - 3 hold - Seated Cervical Retraction  - 1 x daily - 7 x weekly - 3 sets - 10 reps - 3 hold - Seated Upper  Trapezius Stretch  - 1 x daily - 7 x weekly - 1 sets - 3 reps - 20 hold    Exercises - Standing Shoulder Horizontal Abduction with Resistance  - 2 x daily - 7 x weekly - 3 sets - 10 reps - Standing Shoulder Diagonal Horizontal Abduction 60/120 Degrees with Resistance  - 2 x daily - 7 x weekly - 3 sets - 10 reps - Seated Upper Trapezius Stretch  - 2 x daily - 7 x weekly - 3 sets - 30 hold  08/13/24: - 3D thoracic excursion - Wback  08/15/24:  radial nerve glide  08/28/24: Corner pec stretch 3x  30    ASSESSMENT:  CLINICAL IMPRESSION: Session focus on neck mobility, manual techniques to address cervical musculature tightness and postural strengthening.  Increased time this session on manual as pt reports biggest relief following PT sessions with moderate tightness.  Pt reports of decreased radicular symptoms when cueing to rotate palms up.  Added pec stretch with reports of relief.  Postural training to strengthening lower traps to increase relaxation on upper traps.  EOS pt left pain free with no radicular symptoms (had radicular symptoms to all fingers at beginning of session.)  Pt reports decreased radicular symptoms following last session with additional stretches and manual to address the moderate tightness in cervical musculature.  Presents with improved mobility with increased ease following manual techniques and one episode through session near 4 minutes on UBE with reports of fatigue.  Contniued manual techqniues to address UT and supraspinatus tightness.  No reports of pain through session.  Pt to be reassess next session and MRI scheduled for 09/09/24.  Eval: Patient is a 73 y.o. female who was seen today for physical therapy evaluation and treatment for M54.2 (ICD-10-CM) - Cervicalgia. Patient demonstrates decreased cervical spine ROM, increased pain in neck, headaches, and increased tension in bilateral upper trapezius and paraspinals of cervical spine. Patient also suffering from increased frequency and intensity of headaches 2-3 times per week. Patient also demonstrates tenderness to C3-C7 vertebrae CPAs and UPAs especially on the R side of spine. Patient requires moderate verbal cuing for relaxation of cervical spine musculature with difficulty during HEP instruction. Patient would benefit from skilled physical therapy for decreased headache frequency and intensity, increased pain free ROM of cervical spine, increased strength in paraspinal and other postural musculature, and  improved posture for improved ability to perform ADLs without symptom reproduction, return to higher level of function with ADLs, and progress towards therapy goals.   OBJECTIVE IMPAIRMENTS: decreased activity tolerance, decreased endurance, decreased mobility, decreased ROM, decreased strength, impaired sensation, and pain.   ACTIVITY LIMITATIONS: carrying, lifting, and reach over head  PARTICIPATION LIMITATIONS: meal prep, cleaning, laundry, shopping, community activity, and yard work  PERSONAL FACTORS: Age, Fitness, and Time since onset of injury/illness/exacerbation are also affecting patient's functional outcome.   REHAB POTENTIAL: Fair Chronic in nature  CLINICAL DECISION MAKING: Stable/uncomplicated  EVALUATION COMPLEXITY: Low   GOALS: Goals reviewed with patient? Yes  SHORT TERM GOALS: Target date: 08/19/24  Pt will be independent with HEP in order to demonstrate participation in Physical Therapy POC.  Baseline: Goal status: MET  2.  Pt will report 2/10 pain with cervical mobility in order to demonstrate improved pain with ADLs.  Baseline:  Goal status: MET  LONG TERM GOALS: Target date: 09/09/24  Pt will report decreased cervicogenic caused headaches to less than 2 per week in order to demonstrate improved quality of life.  Baseline: 2-3x per  week. (1-2 reported 09/30) Goal status: MET  2.  Pt will improve cervical ROM (flex/ext/lateral flexion/rotation) by 20 degrees combined in order to demonstrate improved functional ambulatory capacity in community setting.  Baseline: see objective.  Goal status: MET  3.  Pt will improve NDI score to 0 in order to demonstrate decreased pain with functional goals and outcomes. Baseline: see objective.  Goal status: IN PROGRESS  4.  Pt will report 1/10 pain with cervical mobility in order to demonstrate reduced pain with ADLs that require use of cervical spine musculature (driving, washing hair, reaching to elevated cabinet).   Baseline: see objective.  Goal status: INITIAL     PLAN:  PT FREQUENCY: 1-2x/week  PT DURATION: 6 weeks  PLANNED INTERVENTIONS: 97110-Therapeutic exercises, 97530- Therapeutic activity, [id number]- Neuromuscular re-education, 97535- Self Care, [id number]- Manual therapy, Patient/Family education, Spinal mobilization, Cryotherapy, and Moist heat  PLAN FOR NEXT SESSION:  Reassess next session.  Manual techniques to cervical mm and manual traction.  Pt may benefits from home traction device, educate next session.  Augustin Mclean, LPTA/CLT; CBIS [phone]  10:33 AM, 08/30/24

## 2024-09-03 ENCOUNTER — Ambulatory Visit (HOSPITAL_COMMUNITY): Admitting: Physical Therapy

## 2024-09-03 DIAGNOSIS — R200 Anesthesia of skin: Secondary | ICD-10-CM

## 2024-09-03 DIAGNOSIS — M542 Cervicalgia: Secondary | ICD-10-CM | POA: Diagnosis not present

## 2024-09-03 DIAGNOSIS — G8929 Other chronic pain: Secondary | ICD-10-CM

## 2024-09-03 NOTE — Therapy (Signed)
 OUTPATIENT PHYSICAL THERAPY CERVICAL TREATMENT Progress Note/Discharge Reporting Period 07/29/24 to 09/03/24  See note below for Objective Data and Assessment of Progress/Goals.       Patient Name: Victoria Dominguez MRN: [id number] DOB:May 15, 1951, 73 y.o., female Today's Date: 09/03/2024  END OF SESSION:  PT End of Session - 09/03/24 0853     Visit Number 11    Number of Visits 13    Date for Recertification  09/09/24    Authorization Type MEDICARE PART A AND B    Authorization Time Period no auth    Progress Note Due on Visit 17   PN complete visit #7   PT Start Time 0818    PT Stop Time 0845    PT Time Calculation (min) 27 min    Activity Tolerance Patient tolerated treatment well    Behavior During Therapy WFL for tasks assessed/performed              Past Medical History:  Diagnosis Date   Hypertension    Past Surgical History:  Procedure Laterality Date   ABDOMINAL HYSTERECTOMY     CATARACT EXTRACTION W/PHACO Right 03/02/2021   Procedure: CATARACT EXTRACTION PHACO AND INTRAOCULAR LENS PLACEMENT (IOC) RIGHT 4.23 00:31.5;  Surgeon: Jaye Fallow, MD;  Location: [facility];  Service: Ophthalmology;  Laterality: Right;  COVID + 01-10-21   CATARACT EXTRACTION W/PHACO Left 03/16/2021   Procedure: CATARACT EXTRACTION PHACO AND INTRAOCULAR LENS PLACEMENT (IOC) LEFT 7.06 00:40.7;  Surgeon: Jaye Fallow, MD;  Location: [facility];  Service: Ophthalmology;  Laterality: Left;   ORIF WRIST FRACTURE Left 09/05/2019   Procedure: OPEN REDUCTION INTERNAL FIXATION (ORIF) WRIST FRACTURE;  Surgeon: Margrette Taft BRAVO, MD;  Location: [facility];  Service: Orthopedics;  Laterality: Left;   Patient Active Problem List   Diagnosis Date Noted   Osteoporosis 01/06/2020   Fracture of left wrist with routine healing 10/04/2019   Fracture, [place], left, closed 09/09/2019   S/P ORIF (open reduction internal fixation) fracture 09/05/2019 left wrist  09/09/2019   Left  wrist fracture    Hypertension 09/02/2019    PCP: Valora Agent, MD   REFERRING PROVIDER: Beverley Evalene BIRCH, MD  REFERRING DIAG: M54.2 (ICD-10-CM) - Cervicalgia  THERAPY DIAG:  Cervicalgia  Numbness and tingling of right arm  Chronic right shoulder pain  Rationale for Evaluation and Treatment: Rehabilitation  ONSET DATE: been going on for a while, 3-4 months ago it has gotten worse.  SUBJECTIVE:  SUBJECTIVE STATEMENT: 09/03/24: pt reports she returns to MD Monday to discuss MRI.  Pt requests to be discharged at this time. Overall feeling better with moderate pain/soreness at times  Eval: Pt states neck pain has been going on for a while now but has worsening in the last few months. States she went to doc for Cortizone shot but wanted her to do therapy first. Pt states pain gets so bad she can hardly lift arm (right). Pt as broken both wrists in the past and wonders if the nerve blocks on RUE could have something to do with the numbness sensation she has all the way down the arm to the hand almost all the time. Pt states she would like to decrease the pain and perform ADLs that require lifting overhead with less pain.     Hand dominance: Right  PERTINENT HISTORY:  Has broken both wrists  PAIN:  Are you having pain? No  PRECAUTIONS: None  RED FLAGS: None     WEIGHT BEARING RESTRICTIONS: No  FALLS:  Has patient fallen in last 6 months? 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  NEXT MD VISIT: 08/19/24  OBJECTIVE:  Note: Objective measures were completed at Evaluation unless otherwise noted.  DIAGNOSTIC  FINDINGS:  None  PATIENT SURVEYS:  Evaluation: NDI: 7/50 08/20/24:  5 / 50 = 10.0 % 09/03/24: 11 / 50 = 22.0 %   COGNITION: Overall cognitive status: Within functional limits for tasks assessed  SENSATION: Light touch: Impaired , right arm consistently numb to the hand  POSTURE: rounded shoulders and forward head  PALPATION: Tenderness to palpation to paraspinals of cervical spine especially of right side and upper trap region.   CERVICAL ROM:   Active ROM A/PROM (deg) eval 08/20/24 09/03/24  Flexion 55, slight pain 60, tingling feeling between neck and shoulder Full ROM No tingling  Extension 35, pain worse than flexion 45, tingling feeling between neck and shoulder Full ROM no tingling  Right lateral flexion 25, increased pain 40, worse pain 42 tightness no pain  Left lateral flexion 40, no pain 45, stretch 45 no pain  Right rotation 64, worse to the right 62, not much pain 80 no pain  Left rotation 46, pain 60, about the same as R 70 no pain   (Blank rows = not tested)  UPPER EXTREMITY ROM:  Active ROM Right eval Left eval  Shoulder flexion    Shoulder extension    Shoulder abduction    Shoulder adduction Bad pain   Shoulder extension    Shoulder internal rotation    Shoulder external rotation    Elbow flexion    Elbow extension    Wrist flexion    Wrist extension    Wrist ulnar deviation    Wrist radial deviation    Wrist pronation    Wrist supination     (Blank rows = not tested)  UPPER EXTREMITY MMT:  MMT Right eval Left eval Right 09/03/24 Left 09/03/24  Shoulder flexion 4 4 5 5   Shoulder extension      Shoulder abduction 4-, pain 4 5 5   Shoulder adduction      Shoulder extension 4 4 5 5   Shoulder internal rotation 4, slight pain 4 5 5   Shoulder external rotation 4, slight pain 4 5 5   Middle trapezius      Lower trapezius  Elbow flexion 4+ 4+ 5 5  Elbow extension 4+ 4+ 5 5  Wrist flexion      Wrist extension      Wrist ulnar  deviation      Wrist radial deviation      Wrist pronation      Wrist supination      Grip strength WNL WNL     (Blank rows = not tested)  CERVICAL SPECIAL TESTS:  Spurling's test: Positive and Distraction test: Positive Right side   TREATMENT DATE:  09/03/24 UBE 2/2 fwd/bkwd level 1 no rad symptoms MMT (all 5/5) Cervical ROM (all WNL no pain) NDI 22% (goal 0%) Goal review Discussion and shown cervical traction units  08/30/24: - UBE backward L3 resistance x4 min, attempted 5 minutes but unable due to reports of radicular symptoms. - Lat pull down 2x 10 plate 4, pt cued for max elbow extension and pulling down to chest level Manual STM to cervical musculature including PROM, manual traction, suboccipital release, trigger point release to UT - Corner stretch 2x 30 - UT stretch 2x 30 each side  08/28/24: -UBE bike, 2 minutes forward, 2 minutes bwd, pt reports increased pain in rihgt neck and shoulder, level 3 resistance Manual STM to cervical musculature including PROM, manual traction, suboccipital release, trigger point release to UT and  08/23/2024  Manual Therapy: -STM of Cervical Paraspinal musculature -Manual traction, 5 reps, 10 seconds on, 5 seconds off -Cervical PROM 4 reps each direction Therapeutic Exercise: -UBE bike, 2 minutes forward, 2 minutes bwd, pt reports increased pain in rihgt neck and shoulder, level 3 resistance -Shoulder Rows/extensions, 2 sets of 10 reps, GTB at chest level, pt cued for eccentric control and scapular retraction -Lat pull downs, 2 sets of 10 reps, plate 4, pt cued for max elbow extension and pulling down to chest level -Standing ball roll up wall with push up at the bottom and cervical rotations at the top , 2 sets of 5 reps, pt cued for walking underneath ball and increased cervical ROM Therapeutic Activity: -Shoulder flexions, 4lb dumbells, 1 set of 8 with shoulder shrug at the top, pt cued for eccentric control -Shoulder  abductions, 2lb dumbbells, 1 set of 8 reps, pt cued for eccentric control -Standing D2 shoulder flexions, 2lb dumbbell, pt cued for UE rotation, bilaterally  08/15/24: UBE bike, 2 minutes forward, 2 minute bwd, pt reports increased pain in R shoulder, no rest break required Standing:  RTB ER 2x 10 5 (cueing to reduce wrist extension) Seated:  [place] Lower Trap pull down 3Pl 2x 10  Radial nerve glide 3x 10 Prone:  Straight arm raise with 1# 10x   Rows 10x Manual: supine cervical traction, sub occipital release, STM bil, Rt>Lt traps, paraspinals  08/13/24: UBE bike, 2 minutes forward, 2 minute bwd, pt reports increased pain in R shoulder, no rest break required Seated:  3D thoracic excursion 5x Manual: supine cervical traction, sub occipital release, STM bil, Rt>Lt traps, paraspinals Seated:   Wback then elbows down (lower trap activation) reports of decreased tingling Upper Trap stretch, 30 holds each direction Levator Scap stretch, 30 each direction    PATIENT EDUCATION:  Education details: Pt was educated on findings of PT evaluation, prognosis, frequency of therapy visits and rationale, attendance policy, and HEP if given.   Person educated: Patient Education method: Explanation, Verbal cues, and Handouts Education comprehension: verbalized understanding, verbal cues required, and needs further education  HOME EXERCISE PROGRAM: Access Code: [email or web address] URL: [email or web address] Date:  07/29/2024 Prepared by: Lang Ada  Exercises - Seated Scapular Retraction  - 1 x daily - 7 x weekly - 3 sets - 10 reps - 3 hold - Seated Cervical Retraction  - 1 x daily - 7 x weekly - 3 sets - 10 reps - 3 hold - Seated Upper Trapezius Stretch  - 1 x daily - 7 x weekly - 1 sets - 3 reps - 20 hold    Exercises - Standing Shoulder Horizontal Abduction with Resistance  - 2 x daily - 7 x weekly - 3 sets - 10 reps - Standing Shoulder Diagonal Horizontal Abduction 60/120  Degrees with Resistance  - 2 x daily - 7 x weekly - 3 sets - 10 reps - Seated Upper Trapezius Stretch  - 2 x daily - 7 x weekly - 3 sets - 30 hold  08/13/24: - 3D thoracic excursion - Wback  08/15/24:  radial nerve glide  08/28/24: Corner pec stretch 3x 30    ASSESSMENT:  CLINICAL IMPRESSION: Test measures completed this session with full painfree Cervical ROM and strength.  Pt still with functional pain at times that does not get higher than 3/10.  Pt reports no difficulty sleeping with with moderate difficulty driving. Discussed cervical traction and pt reports no interest at this time in acquiring a home device. Did show different units available on [employer] if pt should change her mind.  Pt requests to be discharged at this time.  Pt has all HEP instructions and able to complete these independently.      Eval: Patient is a 73 y.o. female who was seen today for physical therapy evaluation and treatment for M54.2 (ICD-10-CM) - Cervicalgia. Patient demonstrates decreased cervical spine ROM, increased pain in neck, headaches, and increased tension in bilateral upper trapezius and paraspinals of cervical spine. Patient also suffering from increased frequency and intensity of headaches 2-3 times per week. Patient also demonstrates tenderness to C3-C7 vertebrae CPAs and UPAs especially on the R side of spine. Patient requires moderate verbal cuing for relaxation of cervical spine musculature with difficulty during HEP instruction. Patient would benefit from skilled physical therapy for decreased headache frequency and intensity, increased pain free ROM of cervical spine, increased strength in paraspinal and other postural musculature, and improved posture for improved ability to perform ADLs without symptom reproduction, return to higher level of function with ADLs, and progress towards therapy goals.   OBJECTIVE IMPAIRMENTS: decreased activity tolerance, decreased endurance, decreased mobility,  decreased ROM, decreased strength, impaired sensation, and pain.   ACTIVITY LIMITATIONS: carrying, lifting, and reach over head  PARTICIPATION LIMITATIONS: meal prep, cleaning, laundry, shopping, community activity, and yard work  PERSONAL FACTORS: Age, Fitness, and Time since onset of injury/illness/exacerbation are also affecting patient's functional outcome.   REHAB POTENTIAL: Fair Chronic in nature  CLINICAL DECISION MAKING: Stable/uncomplicated  EVALUATION COMPLEXITY: Low   GOALS: Goals reviewed with patient? Yes  SHORT TERM GOALS: Target date: 08/19/24  Pt will be independent with HEP in order to demonstrate participation in Physical Therapy POC.  Baseline: Goal status: MET  2.  Pt will report 2/10 pain with cervical mobility in order to demonstrate improved pain with ADLs.  Baseline:  Goal status: MET  LONG TERM GOALS: Target date: 09/09/24  Pt will report decreased cervicogenic caused headaches to less than 2 per week in order to demonstrate improved quality of life.  Baseline: 2-3x per week. (1-2 reported 09/30) Goal status: MET  2.  Pt will improve cervical ROM (flex/ext/lateral flexion/rotation)  by 20 degrees combined in order to demonstrate improved functional ambulatory capacity in community setting.  Baseline: see objective.  Goal status: MET  3.  Pt will improve NDI score to 0 in order to demonstrate decreased pain with functional goals and outcomes. Baseline: see objective.  Goal status: NOT MET (Neck Disability Index score: 11 / 50 = 22.0 %)  4.  Pt will report 1/10 pain with cervical mobility in order to demonstrate reduced pain with ADLs that require use of cervical spine musculature (driving, washing hair, reaching to elevated cabinet).  Baseline: see objective.  Goal status: NOT MET (pt reports pain as high as 3/10)    PLAN:  PT FREQUENCY: 1-2x/week  PT DURATION: 6 weeks  PLANNED INTERVENTIONS: 97110-Therapeutic exercises, 97530- Therapeutic  activity, [id number]- Neuromuscular re-education, 97535- Self Care, [id number]- Manual therapy, Patient/Family education, Spinal mobilization, Cryotherapy, and Moist heat  PLAN FOR NEXT SESSION:  Discharge per pt request; most goals met.  Greig KATHEE Fuse, PTA/CLT [facility] Ph: [phone] 8:54 AM, 09/03/24

## 2024-09-10 ENCOUNTER — Encounter (HOSPITAL_COMMUNITY): Admitting: Physical Therapy

## 2024-09-17 ENCOUNTER — Encounter (HOSPITAL_COMMUNITY)

## 2025-01-14 ENCOUNTER — Encounter: Payer: Medicare Other | Admitting: Dermatology
